# Patient Record
Sex: Female | Born: 1980 | Race: Black or African American | Hispanic: No | Marital: Single | Smoking: Current every day smoker
Health system: Southern US, Community
[De-identification: ages and names within clinical notes are randomized; demographics above are authoritative.]

## PROBLEM LIST (undated history)

## (undated) DIAGNOSIS — N183 Chronic kidney disease, stage 3 unspecified: Secondary | ICD-10-CM

## (undated) DIAGNOSIS — N946 Dysmenorrhea, unspecified: Secondary | ICD-10-CM

## (undated) DIAGNOSIS — M5030 Other cervical disc degeneration, unspecified cervical region: Secondary | ICD-10-CM

## (undated) DIAGNOSIS — Z973 Presence of spectacles and contact lenses: Secondary | ICD-10-CM

## (undated) DIAGNOSIS — G43909 Migraine, unspecified, not intractable, without status migrainosus: Secondary | ICD-10-CM

## (undated) DIAGNOSIS — E559 Vitamin D deficiency, unspecified: Secondary | ICD-10-CM

## (undated) DIAGNOSIS — Z8759 Personal history of other complications of pregnancy, childbirth and the puerperium: Secondary | ICD-10-CM

## (undated) DIAGNOSIS — G8929 Other chronic pain: Secondary | ICD-10-CM

## (undated) DIAGNOSIS — A599 Trichomoniasis, unspecified: Secondary | ICD-10-CM

## (undated) DIAGNOSIS — Z8619 Personal history of other infectious and parasitic diseases: Secondary | ICD-10-CM

## (undated) DIAGNOSIS — I1A0 Resistant hypertension: Secondary | ICD-10-CM

## (undated) DIAGNOSIS — Z8679 Personal history of other diseases of the circulatory system: Secondary | ICD-10-CM

## (undated) DIAGNOSIS — R001 Bradycardia, unspecified: Secondary | ICD-10-CM

## (undated) DIAGNOSIS — F329 Major depressive disorder, single episode, unspecified: Secondary | ICD-10-CM

## (undated) DIAGNOSIS — D259 Leiomyoma of uterus, unspecified: Secondary | ICD-10-CM

## (undated) DIAGNOSIS — N939 Abnormal uterine and vaginal bleeding, unspecified: Secondary | ICD-10-CM

## (undated) DIAGNOSIS — F1011 Alcohol abuse, in remission: Secondary | ICD-10-CM

## (undated) DIAGNOSIS — D500 Iron deficiency anemia secondary to blood loss (chronic): Secondary | ICD-10-CM

## (undated) DIAGNOSIS — F17200 Nicotine dependence, unspecified, uncomplicated: Secondary | ICD-10-CM

## (undated) DIAGNOSIS — R0609 Other forms of dyspnea: Secondary | ICD-10-CM

## (undated) DIAGNOSIS — M109 Gout, unspecified: Secondary | ICD-10-CM

## (undated) DIAGNOSIS — IMO0001 Reserved for inherently not codable concepts without codable children: Secondary | ICD-10-CM

## (undated) DIAGNOSIS — R87629 Unspecified abnormal cytological findings in specimens from vagina: Secondary | ICD-10-CM

## (undated) DIAGNOSIS — D219 Benign neoplasm of connective and other soft tissue, unspecified: Secondary | ICD-10-CM

## (undated) DIAGNOSIS — I10 Essential (primary) hypertension: Secondary | ICD-10-CM

## (undated) DIAGNOSIS — O1490 Unspecified pre-eclampsia, unspecified trimester: Secondary | ICD-10-CM

## (undated) DIAGNOSIS — N83209 Unspecified ovarian cyst, unspecified side: Secondary | ICD-10-CM

## (undated) DIAGNOSIS — R896 Abnormal cytological findings in specimens from other organs, systems and tissues: Secondary | ICD-10-CM

## (undated) HISTORY — DX: Personal history of other diseases of the circulatory system: Z86.79

## (undated) HISTORY — DX: Gout, unspecified: M10.9

## (undated) HISTORY — DX: Migraine, unspecified, not intractable, without status migrainosus: G43.909

## (undated) HISTORY — DX: Alcohol abuse, in remission: F10.11

## (undated) HISTORY — DX: Bradycardia, unspecified: R00.1

## (undated) HISTORY — DX: Reserved for inherently not codable concepts without codable children: IMO0001

## (undated) HISTORY — DX: Unspecified pre-eclampsia, unspecified trimester: O14.90

## (undated) HISTORY — DX: Trichomoniasis, unspecified: A59.9

## (undated) HISTORY — DX: Unspecified abnormal cytological findings in specimens from vagina: R87.629

## (undated) HISTORY — DX: Nicotine dependence, unspecified, uncomplicated: F17.200

## (undated) HISTORY — DX: Unspecified ovarian cyst, unspecified side: N83.209

## (undated) HISTORY — DX: Abnormal cytological findings in specimens from other organs, systems and tissues: R89.6

## (undated) HISTORY — DX: Essential (primary) hypertension: I10

---

## 1999-05-14 ENCOUNTER — Emergency Department (HOSPITAL_COMMUNITY): Admission: EM | Admit: 1999-05-14 | Discharge: 1999-05-14 | Payer: Self-pay | Admitting: Emergency Medicine

## 2001-09-20 ENCOUNTER — Other Ambulatory Visit: Admission: RE | Admit: 2001-09-20 | Discharge: 2001-09-20 | Payer: Self-pay | Admitting: Obstetrics and Gynecology

## 2001-10-22 ENCOUNTER — Inpatient Hospital Stay (HOSPITAL_COMMUNITY): Admission: AD | Admit: 2001-10-22 | Discharge: 2001-10-22 | Payer: Self-pay | Admitting: Obstetrics and Gynecology

## 2001-11-27 ENCOUNTER — Inpatient Hospital Stay (HOSPITAL_COMMUNITY): Admission: AD | Admit: 2001-11-27 | Discharge: 2001-11-27 | Payer: Self-pay | Admitting: Obstetrics and Gynecology

## 2002-02-02 ENCOUNTER — Inpatient Hospital Stay (HOSPITAL_COMMUNITY): Admission: AD | Admit: 2002-02-02 | Discharge: 2002-02-04 | Payer: Self-pay | Admitting: Obstetrics

## 2002-02-02 ENCOUNTER — Encounter: Payer: Self-pay | Admitting: Obstetrics

## 2002-02-20 ENCOUNTER — Inpatient Hospital Stay (HOSPITAL_COMMUNITY): Admission: AD | Admit: 2002-02-20 | Discharge: 2002-02-25 | Payer: Self-pay | Admitting: Obstetrics

## 2002-02-20 ENCOUNTER — Encounter (INDEPENDENT_AMBULATORY_CARE_PROVIDER_SITE_OTHER): Payer: Self-pay | Admitting: Specialist

## 2003-06-07 ENCOUNTER — Emergency Department (HOSPITAL_COMMUNITY): Admission: EM | Admit: 2003-06-07 | Discharge: 2003-06-07 | Payer: Self-pay | Admitting: Emergency Medicine

## 2003-07-06 ENCOUNTER — Encounter: Admission: RE | Admit: 2003-07-06 | Discharge: 2003-07-06 | Payer: Self-pay | Admitting: Internal Medicine

## 2003-07-20 ENCOUNTER — Encounter: Admission: RE | Admit: 2003-07-20 | Discharge: 2003-07-20 | Payer: Self-pay | Admitting: Internal Medicine

## 2003-08-01 ENCOUNTER — Encounter: Admission: RE | Admit: 2003-08-01 | Discharge: 2003-08-01 | Payer: Self-pay | Admitting: Internal Medicine

## 2003-09-04 ENCOUNTER — Encounter: Admission: RE | Admit: 2003-09-04 | Discharge: 2003-09-04 | Payer: Self-pay | Admitting: Internal Medicine

## 2003-09-05 ENCOUNTER — Ambulatory Visit (HOSPITAL_COMMUNITY): Admission: RE | Admit: 2003-09-05 | Discharge: 2003-09-05 | Payer: Self-pay | Admitting: Internal Medicine

## 2003-10-05 ENCOUNTER — Encounter: Admission: RE | Admit: 2003-10-05 | Discharge: 2003-10-05 | Payer: Self-pay | Admitting: Internal Medicine

## 2003-12-14 ENCOUNTER — Emergency Department (HOSPITAL_COMMUNITY): Admission: EM | Admit: 2003-12-14 | Discharge: 2003-12-14 | Payer: Self-pay | Admitting: Emergency Medicine

## 2004-03-27 ENCOUNTER — Ambulatory Visit: Payer: Self-pay | Admitting: Internal Medicine

## 2004-06-11 ENCOUNTER — Ambulatory Visit: Payer: Self-pay | Admitting: Internal Medicine

## 2004-08-07 ENCOUNTER — Ambulatory Visit: Payer: Self-pay | Admitting: Internal Medicine

## 2004-08-07 ENCOUNTER — Inpatient Hospital Stay (HOSPITAL_COMMUNITY): Admission: AD | Admit: 2004-08-07 | Discharge: 2004-08-08 | Payer: Self-pay | Admitting: Internal Medicine

## 2004-08-08 ENCOUNTER — Encounter (INDEPENDENT_AMBULATORY_CARE_PROVIDER_SITE_OTHER): Payer: Self-pay | Admitting: *Deleted

## 2005-03-20 ENCOUNTER — Ambulatory Visit: Payer: Self-pay | Admitting: Internal Medicine

## 2005-03-25 ENCOUNTER — Ambulatory Visit (HOSPITAL_COMMUNITY)
Admission: RE | Admit: 2005-03-25 | Discharge: 2005-03-25 | Payer: Self-pay | Admitting: Physical Medicine & Rehabilitation

## 2005-05-28 ENCOUNTER — Ambulatory Visit (HOSPITAL_COMMUNITY): Admission: RE | Admit: 2005-05-28 | Discharge: 2005-05-28 | Payer: Self-pay | Admitting: Internal Medicine

## 2005-07-21 ENCOUNTER — Ambulatory Visit: Payer: Self-pay | Admitting: Internal Medicine

## 2005-07-23 ENCOUNTER — Ambulatory Visit (HOSPITAL_COMMUNITY): Admission: RE | Admit: 2005-07-23 | Discharge: 2005-07-23 | Payer: Self-pay | Admitting: Internal Medicine

## 2006-01-29 ENCOUNTER — Ambulatory Visit: Payer: Self-pay | Admitting: Internal Medicine

## 2006-02-04 DIAGNOSIS — I10 Essential (primary) hypertension: Secondary | ICD-10-CM | POA: Insufficient documentation

## 2006-02-04 DIAGNOSIS — N83209 Unspecified ovarian cyst, unspecified side: Secondary | ICD-10-CM

## 2006-02-04 DIAGNOSIS — F17200 Nicotine dependence, unspecified, uncomplicated: Secondary | ICD-10-CM | POA: Insufficient documentation

## 2006-02-04 HISTORY — DX: Unspecified ovarian cyst, unspecified side: N83.209

## 2006-02-06 ENCOUNTER — Emergency Department (HOSPITAL_COMMUNITY): Admission: EM | Admit: 2006-02-06 | Discharge: 2006-02-06 | Payer: Self-pay | Admitting: Emergency Medicine

## 2006-02-13 ENCOUNTER — Ambulatory Visit: Payer: Self-pay | Admitting: Internal Medicine

## 2006-02-13 ENCOUNTER — Encounter (INDEPENDENT_AMBULATORY_CARE_PROVIDER_SITE_OTHER): Payer: Self-pay | Admitting: Pulmonary Disease

## 2006-02-13 LAB — CONVERTED CEMR LAB
BUN: 15 mg/dL (ref 6–23)
CO2: 30 meq/L (ref 19–32)
Calcium: 10.1 mg/dL (ref 8.4–10.5)
Chloride: 105 meq/L (ref 96–112)
Creatinine, Ser: 1.1 mg/dL (ref 0.40–1.20)
Glucose, Bld: 92 mg/dL (ref 70–99)
Potassium: 3.8 meq/L (ref 3.5–5.3)
Sodium: 143 meq/L (ref 135–145)

## 2006-04-09 ENCOUNTER — Ambulatory Visit: Payer: Self-pay | Admitting: Internal Medicine

## 2006-04-09 ENCOUNTER — Encounter (INDEPENDENT_AMBULATORY_CARE_PROVIDER_SITE_OTHER): Payer: Self-pay | Admitting: *Deleted

## 2006-04-09 LAB — CONVERTED CEMR LAB
Bilirubin Urine: NEGATIVE
Ketones, ur: NEGATIVE mg/dL
Leukocytes, UA: NEGATIVE
Nitrite: NEGATIVE
Protein, ur: NEGATIVE mg/dL
RBC / HPF: NONE SEEN (ref <3)
Specific Gravity, Urine: 1.018 (ref 1.005–1.03)
Urine Glucose: NEGATIVE mg/dL
Urobilinogen, UA: 0.2 (ref 0.0–1.0)
WBC, UA: NONE SEEN cells/hpf (ref <3)
pH: 6.5 (ref 5.0–8.0)

## 2006-07-07 ENCOUNTER — Telehealth: Payer: Self-pay | Admitting: *Deleted

## 2006-10-22 ENCOUNTER — Telehealth (INDEPENDENT_AMBULATORY_CARE_PROVIDER_SITE_OTHER): Payer: Self-pay | Admitting: *Deleted

## 2006-12-16 ENCOUNTER — Telehealth: Payer: Self-pay | Admitting: *Deleted

## 2007-04-05 ENCOUNTER — Ambulatory Visit: Payer: Self-pay | Admitting: Internal Medicine

## 2007-04-05 ENCOUNTER — Encounter (INDEPENDENT_AMBULATORY_CARE_PROVIDER_SITE_OTHER): Payer: Self-pay | Admitting: Infectious Diseases

## 2007-04-05 ENCOUNTER — Telehealth (INDEPENDENT_AMBULATORY_CARE_PROVIDER_SITE_OTHER): Payer: Self-pay | Admitting: Internal Medicine

## 2007-04-05 LAB — CONVERTED CEMR LAB
BUN: 22 mg/dL (ref 6–23)
CO2: 24 meq/L (ref 19–32)
Calcium: 9.1 mg/dL (ref 8.4–10.5)
Chloride: 105 meq/L (ref 96–112)
Creatinine, Ser: 1.08 mg/dL (ref 0.40–1.20)
Glucose, Bld: 86 mg/dL (ref 70–99)
Potassium: 4.1 meq/L (ref 3.5–5.3)
Sodium: 141 meq/L (ref 135–145)

## 2007-04-12 ENCOUNTER — Ambulatory Visit: Payer: Self-pay | Admitting: Hospitalist

## 2007-04-12 DIAGNOSIS — Z8679 Personal history of other diseases of the circulatory system: Secondary | ICD-10-CM

## 2007-04-12 HISTORY — DX: Personal history of other diseases of the circulatory system: Z86.79

## 2007-05-20 ENCOUNTER — Encounter (INDEPENDENT_AMBULATORY_CARE_PROVIDER_SITE_OTHER): Payer: Self-pay | Admitting: Infectious Diseases

## 2007-05-20 ENCOUNTER — Ambulatory Visit: Payer: Self-pay | Admitting: Infectious Disease

## 2007-05-20 LAB — CONVERTED CEMR LAB
BUN: 14 mg/dL (ref 6–23)
Beta hcg, urine, semiquantitative: NEGATIVE
Bilirubin Urine: NEGATIVE
CO2: 25 meq/L (ref 19–32)
Calcium: 9.4 mg/dL (ref 8.4–10.5)
Chloride: 104 meq/L (ref 96–112)
Creatinine, Ser: 1.13 mg/dL (ref 0.40–1.20)
Glucose, Bld: 90 mg/dL (ref 70–99)
Glucose, Urine, Semiquant: NEGATIVE
Ketones, urine, test strip: NEGATIVE
Nitrite: NEGATIVE
Potassium: 4.4 meq/L (ref 3.5–5.3)
Sodium: 139 meq/L (ref 135–145)
Specific Gravity, Urine: 1.02
Urobilinogen, UA: 0.2
hCG, Beta Chain, Quant, S: <2 milliintl units/mL
pH: 6.5

## 2007-05-21 ENCOUNTER — Telehealth (INDEPENDENT_AMBULATORY_CARE_PROVIDER_SITE_OTHER): Payer: Self-pay | Admitting: Infectious Diseases

## 2007-05-21 LAB — CONVERTED CEMR LAB
Bilirubin Urine: NEGATIVE
Ketones, ur: NEGATIVE mg/dL
Nitrite: NEGATIVE
Protein, ur: NEGATIVE mg/dL
Specific Gravity, Urine: 1.022 (ref 1.005–1.03)
Urine Glucose: NEGATIVE mg/dL
Urobilinogen, UA: 0.2 (ref 0.0–1.0)
pH: 6.5 (ref 5.0–8.0)

## 2008-01-17 ENCOUNTER — Telehealth (INDEPENDENT_AMBULATORY_CARE_PROVIDER_SITE_OTHER): Payer: Self-pay | Admitting: Internal Medicine

## 2008-06-14 ENCOUNTER — Telehealth: Payer: Self-pay | Admitting: *Deleted

## 2008-07-15 ENCOUNTER — Emergency Department (HOSPITAL_COMMUNITY): Admission: EM | Admit: 2008-07-15 | Discharge: 2008-07-15 | Payer: Self-pay | Admitting: Family Medicine

## 2008-09-20 ENCOUNTER — Telehealth (INDEPENDENT_AMBULATORY_CARE_PROVIDER_SITE_OTHER): Payer: Self-pay | Admitting: Internal Medicine

## 2008-10-11 ENCOUNTER — Emergency Department (HOSPITAL_COMMUNITY): Admission: EM | Admit: 2008-10-11 | Discharge: 2008-10-11 | Payer: Self-pay | Admitting: Emergency Medicine

## 2009-02-14 ENCOUNTER — Ambulatory Visit: Payer: Self-pay | Admitting: Internal Medicine

## 2009-02-14 ENCOUNTER — Telehealth: Payer: Self-pay | Admitting: *Deleted

## 2009-02-15 ENCOUNTER — Emergency Department (HOSPITAL_COMMUNITY): Admission: EM | Admit: 2009-02-15 | Discharge: 2009-02-15 | Payer: Self-pay | Admitting: Emergency Medicine

## 2009-02-15 ENCOUNTER — Encounter (INDEPENDENT_AMBULATORY_CARE_PROVIDER_SITE_OTHER): Payer: Self-pay | Admitting: Internal Medicine

## 2009-02-15 LAB — CONVERTED CEMR LAB
ALT: 14 units/L (ref 0–35)
AST: 16 units/L (ref 0–37)
Albumin: 4 g/dL (ref 3.5–5.2)
Alkaline Phosphatase: 68 units/L (ref 39–117)
BUN: 18 mg/dL (ref 6–23)
Bilirubin Urine: NEGATIVE
CO2: 25 meq/L (ref 19–32)
Calcium: 9.3 mg/dL (ref 8.4–10.5)
Chloride: 107 meq/L (ref 96–112)
Creatinine, Ser: 1.1 mg/dL (ref 0.40–1.20)
Glucose, Bld: 83 mg/dL (ref 70–99)
HCT: 37.6 % (ref 36.0–46.0)
Hemoglobin: 11.9 g/dL — ABNORMAL LOW (ref 12.0–15.0)
Ketones, ur: NEGATIVE mg/dL
Leukocytes, UA: NEGATIVE
MCHC: 31.6 g/dL (ref 30.0–36.0)
MCV: 91.9 fL (ref >=78.0)
Nitrite: NEGATIVE
Platelets: 229 10*3/uL (ref 150–400)
Potassium: 4.1 meq/L (ref 3.5–5.3)
Protein, ur: NEGATIVE mg/dL
RBC / HPF: NONE SEEN (ref <3)
RBC: 4.09 M/uL (ref 3.87–5.11)
RDW: 13.4 % (ref 11.5–15.5)
Sodium: 141 meq/L (ref 135–145)
Specific Gravity, Urine: 1.024 (ref 1.005–1.0)
TSH: 0.826 microintl units/mL (ref 0.350–4.5)
Total Bilirubin: 0.4 mg/dL (ref 0.3–1.2)
Total Protein: 6.9 g/dL (ref 6.0–8.3)
Urine Glucose: NEGATIVE mg/dL
Urobilinogen, UA: 0.2 (ref 0.0–1.0)
WBC: 8.6 10*3/uL (ref 4.0–10.5)
pH: 6 (ref 5.0–8.0)

## 2009-04-26 ENCOUNTER — Ambulatory Visit: Payer: Self-pay | Admitting: Internal Medicine

## 2009-04-27 LAB — CONVERTED CEMR LAB
Candida species: NEGATIVE
Gardnerella vaginalis: NEGATIVE

## 2009-04-30 ENCOUNTER — Telehealth: Payer: Self-pay | Admitting: Internal Medicine

## 2009-06-21 ENCOUNTER — Ambulatory Visit: Payer: Self-pay | Admitting: Internal Medicine

## 2009-06-21 DIAGNOSIS — N920 Excessive and frequent menstruation with regular cycle: Secondary | ICD-10-CM | POA: Insufficient documentation

## 2009-06-21 LAB — CONVERTED CEMR LAB
ALT: 22 units/L (ref 0–35)
AST: 22 units/L (ref 0–37)
Albumin: 3.7 g/dL (ref 3.5–5.2)
Alkaline Phosphatase: 69 units/L (ref 39–117)
BUN: 17 mg/dL (ref 6–23)
Basophils Absolute: 0 10*3/uL (ref 0.0–0.1)
Basophils Relative: 0 % (ref 0–1)
CO2: 29 meq/L (ref 19–32)
Calcium: 8.9 mg/dL (ref 8.4–10.5)
Chloride: 103 meq/L (ref 96–112)
Creatinine, Ser: 0.95 mg/dL (ref 0.40–1.20)
Eosinophils Absolute: 0.2 10*3/uL (ref 0.0–0.7)
Eosinophils Relative: 2 % (ref 0–5)
Glucose, Bld: 99 mg/dL (ref 70–99)
HCT: 34.4 % — ABNORMAL LOW (ref 36.0–46.0)
Hemoglobin: 12 g/dL (ref 12.0–15.0)
INR: 0.99 (ref <1.50)
Lipase: 20 units/L (ref 11–59)
Lymphocytes Relative: 22 % (ref 12–46)
Lymphs Abs: 1.9 10*3/uL (ref 0.7–4.0)
MCHC: 34.8 g/dL (ref 30.0–36.0)
MCV: 90.5 fL (ref >=78.0)
Monocytes Absolute: 0.4 10*3/uL (ref 0.1–1.0)
Monocytes Relative: 5 % (ref 3–12)
Neutro Abs: 6.3 10*3/uL (ref 1.7–7.7)
Neutrophils Relative %: 71 % (ref 43–77)
Platelets: 215 10*3/uL (ref 150–400)
Potassium: 3.8 meq/L (ref 3.5–5.3)
Prothrombin Time: 13 s (ref 11.6–15.2)
RBC: 3.8 M/uL — ABNORMAL LOW (ref 3.87–5.11)
RDW: 13.3 % (ref 11.5–15.5)
Sodium: 138 meq/L (ref 135–145)
Total Bilirubin: 0.6 mg/dL (ref 0.3–1.2)
Total Protein: 6.8 g/dL (ref 6.0–8.3)
WBC: 8.8 10*3/uL (ref 4.0–10.5)
aPTT: 25 s (ref 24–37)

## 2009-08-14 ENCOUNTER — Telehealth: Payer: Self-pay | Admitting: Internal Medicine

## 2009-08-22 ENCOUNTER — Telehealth: Payer: Self-pay | Admitting: Internal Medicine

## 2009-08-29 ENCOUNTER — Ambulatory Visit: Payer: Self-pay | Admitting: Internal Medicine

## 2009-08-29 ENCOUNTER — Encounter: Payer: Self-pay | Admitting: Internal Medicine

## 2009-11-22 ENCOUNTER — Emergency Department (HOSPITAL_COMMUNITY): Admission: EM | Admit: 2009-11-22 | Discharge: 2009-11-23 | Payer: Self-pay | Admitting: Emergency Medicine

## 2010-04-01 ENCOUNTER — Ambulatory Visit: Payer: Self-pay | Admitting: Internal Medicine

## 2010-04-02 ENCOUNTER — Ambulatory Visit (HOSPITAL_COMMUNITY)
Admission: RE | Admit: 2010-04-02 | Discharge: 2010-04-02 | Payer: Self-pay | Source: Home / Self Care | Attending: Internal Medicine | Admitting: Internal Medicine

## 2010-04-02 ENCOUNTER — Encounter: Payer: Self-pay | Admitting: Internal Medicine

## 2010-04-02 DIAGNOSIS — F329 Major depressive disorder, single episode, unspecified: Secondary | ICD-10-CM | POA: Insufficient documentation

## 2010-04-02 LAB — CONVERTED CEMR LAB
Amphetamine Screen, Ur: NEGATIVE
Barbiturate Quant, Ur: NEGATIVE
Benzodiazepines.: NEGATIVE
Cholesterol: 169 mg/dL (ref 0–200)
Cocaine Metabolites: NEGATIVE
Creatinine,U: 355.5 mg/dL
HDL: 51 mg/dL (ref >39)
LDL Cholesterol: 100 mg/dL — ABNORMAL HIGH (ref 0–99)
Marijuana Metabolite: NEGATIVE
Methadone: NEGATIVE
Opiates: NEGATIVE
Phencyclidine (PCP): NEGATIVE
Propoxyphene: NEGATIVE
TSH: 1.16 microintl units/mL (ref 0.350–4.50)
Total CHOL/HDL Ratio: 3.3
Total CK: 247 units/L — ABNORMAL HIGH (ref 7–177)
Triglycerides: 89 mg/dL (ref <150)
VLDL: 18 mg/dL (ref 0–40)

## 2010-04-04 ENCOUNTER — Telehealth: Payer: Self-pay | Admitting: Licensed Clinical Social Worker

## 2010-04-08 ENCOUNTER — Encounter: Payer: Self-pay | Admitting: Licensed Clinical Social Worker

## 2010-04-17 ENCOUNTER — Ambulatory Visit: Payer: Self-pay

## 2010-05-01 ENCOUNTER — Ambulatory Visit: Admit: 2010-05-01 | Payer: Self-pay

## 2010-05-21 NOTE — Assessment & Plan Note (Signed)
Summary: 1WK FU/COHEN/VS   Vital Signs:  Patient Profile:   30 Years Old Female Temp:     98.0 degrees F (36.67 degrees C) Pulse rate:   50 / minute BP sitting:   147 / 104  (left arm)  Vitals Entered By: Stanton Kidney Ditzler RN (April 12, 2007 11:57 AM)             Is Patient Diabetic? No  Does patient need assistance? Functional Status Self care Ambulation Normal     PCP:  Valetta Close MD  Chief Complaint:  FU.  History of Present Illness: Jeanette Gamble is here for a follow up on her BP. She has been taking her antihypertensive medications regularly. She also complains of occasional blurry vision. She also mentions that she does not sleep well at night and wakes up tired in the morning   Current Allergies (reviewed today): No known allergies  Updated/Current Medications (including changes made in today's visit):  HYDROCHLOROTHIAZIDE 25 MG TABS (HYDROCHLOROTHIAZIDE) Take 1 tablet by mouth once a day LISINOPRIL 40 MG  TABS (LISINOPRIL) Take 1 tablet by mouth once a day IMITREX 50 MG TABS (SUMATRIPTAN SUCCINATE) by mouth daily as needed for migraines   Past Medical History:    Reviewed history from 02/04/2006 and no changes required:       Hypertension       Ovarian cysts-benign by [place]       Preeclampsia       Smoking       Migraine headache       Trichomonas       Bradicardia                  Risk Factors: Tobacco use:  current   Review of Systems  The patient denies anorexia, fever, weight loss, vision loss, decreased hearing, hoarseness, chest pain, syncope, dyspnea on exhertion, peripheral edema, prolonged cough, hemoptysis, abdominal pain, melena, hematochezia, severe indigestion/heartburn, and hematuria.     Physical Exam  General:     overweight-appearing.   Head:     normocephalic.   Eyes:     vision grossly intact.   Neck:     supple.   Lungs:     normal respiratory effort, no intercostal retractions, no accessory muscle use, and normal breath  sounds.   Heart:     normal rate, regular rhythm, and no murmur.   Abdomen:     soft, non-tender, normal bowel sounds, no distention, and no masses.   Msk:     normal ROM and no joint tenderness.   Neurologic:     alert & oriented X3, cranial nerves II-XII intact, strength normal in all extremities, sensation intact to light touch, sensation intact to pinprick, and gait normal.      Impression & Recommendations:  Problem # 1:  HYPERTENSION (ICD-401.9) Her BP is improved from the last visit but still elevated.She has been taking her medications regularly.Will increase her Lisinopril to 40 mg daily. Her BMET appears normal and will need to repeat in the next visit. She will continue HCTZ. If no response will need to add norvasc. Her low heart rate precludes using b blockers. She has had some work up for secondary cause of hypertension. Her MRI of the abdomen did not show renal artery stenosis or tumors of the adrenals. She has a BMET done recently which does not show hypokalemia suggestive of hyperaldosteronism. There is a concern for sleep apnea and a sleep study is beeing done. No history  of steroid or OCP use. May consider metanephrine and TSH check if control is still an issue. The following medications were removed from the medication list:    Norvasc 10 Mg Tabs (Amlodipine besylate) .Marland Kitchen... Take 1 tablet by mouth once a day  Her updated medication list for this problem includes:    Hydrochlorothiazide 25 Mg Tabs (Hydrochlorothiazide) .Marland Kitchen... Take 1 tablet by mouth once a day    Lisinopril 40 Mg Tabs (Lisinopril) .Marland Kitchen... Take 1 tablet by mouth once a day  BP today: 147/104 Prior BP: 166/111 (04/05/2007)  Labs Reviewed: Creat: 1.08 (04/05/2007)   Problem # 2:  BLURRED VISION (ICD-368.8) She is being referred to ophthalmology. She does have a history of poorly controlled hypertension. Orders: Ophthalmology Referral (Ophthalmology)   Problem # 3:  RISK OF SLEEP APNEA (ICD-V12.59) She  complains of insomnia, feeling tired in the morning and headaches.She does have the body habitus for OSA. This could be exacerbating the HTN  Orders: Sleep Study Other (Sleep Study Other)   Complete Medication List: 1)  Hydrochlorothiazide 25 Mg Tabs (Hydrochlorothiazide) .... Take 1 tablet by mouth once a day 2)  Lisinopril 40 Mg Tabs (Lisinopril) .... Take 1 tablet by mouth once a day 3)  Imitrex 50 Mg Tabs (Sumatriptan succinate) .... By mouth daily as needed for migraines   Patient Instructions: 1)  Please schedule a follow-up appointment in 3 weeks.    Prescriptions: LISINOPRIL 40 MG  TABS (LISINOPRIL) Take 1 tablet by mouth once a day  #31 x 6   Entered and Authorized by:   Ronda Fairly MD   Signed by:   Ronda Fairly MD on 04/12/2007   Method used:   Electronically sent to ...       [id number]       [address]       [place], [place]  [address]       Ph: [phone] or [phone]       Fax: [phone]   RxID:   [id number]  ]  Appended Document: 1WK FU/COHEN/VS Add established office visit level 3. Attending is Dr Okey Dupre

## 2010-05-21 NOTE — Progress Notes (Signed)
Summary: refill/ hla  Phone Note Refill Request Message from:  Fax from Pharmacy on July 07, 2006 4:22 PM  Refills Requested: Medication #1:  HYDROCHLOROTHIAZIDE 25 MG TABS Take 1 tablet by mouth once a day Initial call taken by: Marin Roberts RN,  July 07, 2006 4:22 PM  Follow-up for Phone Call        Refill approved-nurse to complete. Follow-up by: Eliseo Gum MD,  July 07, 2006 4:55 PM  Additional Follow-up for Phone Call Additional follow up Details #1::        Rx faxed to pharmacy Additional Follow-up by: Marin Roberts RN,  July 08, 2006 9:03 AM    Prescriptions: HYDROCHLOROTHIAZIDE 25 MG TABS (HYDROCHLOROTHIAZIDE) Take 1 tablet by mouth once a day  #30 x 5   Entered and Authorized by:   Eliseo Gum MD   Signed by:   Eliseo Gum MD on 07/07/2006   Method used:   Telephoned to ...       [facility]       [address] [place], [place]  [address]  [place]       Ph: [phone]       Fax: [phone]   RxID:   [id number]

## 2010-05-21 NOTE — Miscellaneous (Signed)
  Clinical Lists Changes  Orders: Added new Test order of Split Night (Split Night) - Signed      Problem # 13:  RISK OF SLEEP APNEA (ICD-V12.59) see office visit  Orders: Split Night (Split Night)   Complete Medication List: 1)  Hydrochlorothiazide 25 Mg Tabs (Hydrochlorothiazide) .... Take 1 tablet by mouth once a day 2)  Norvasc 10 Mg Tabs (Amlodipine besylate) .... Take one tablet daily. 3)  Omeprazole 20 Mg Cpdr (Omeprazole) .... Take 1 tablet by mouth once a day 4)  Coreg 6.25 Mg Tabs (Carvedilol) .... Take 1 tablet by mouth two times a day 5)  Imitrex 50 Mg Tabs (Sumatriptan succinate) .... Take 1 tablet at the onset of headache.

## 2010-05-21 NOTE — Assessment & Plan Note (Signed)
Summary: per dr Onalee Hua f/u bp/pcp-Chanette Demo/hla   Vital Signs:  Patient profile:   30 year old female Height:      66.4 inches (168.66 cm) Weight:      272.8 pounds (124 kg) BMI:     43.66 Temp:     98.4 degrees F oral Pulse rate:   75 / minute BP sitting:   143 / 97  (right arm)  Vitals Entered By: Chinita Pester RN (Aug 29, 2009 10:22 AM) CC: BP re-check.  She's taking 2 tsp. of vinegar daily for BP. Abdominal pain., Headache Is Patient Diabetic? No Pain Assessment Patient in pain? no      Nutritional Status BMI of > 30 = obese  Have you ever been in a relationship where you felt threatened, hurt or afraid?No   Does patient need assistance? Functional Status Self care Ambulation Normal Comments Pt.has talked to [facility].   Primary Care Provider:  Valetta Close MD  CC:  BP re-check.  She's taking 2 tsp. of vinegar daily for BP. Abdominal pain. and Headache.  History of Present Illness: Jeanette Gamble is a 30 yo woman wtih PMH as outlined in chart.  She is here for follow up of blood pressure and nausea.  Feels better but still has nausea all the time.  Continues to eat but states her appetite is not as well as it was previously.  Otherwise no other complaints.   Depression History:      The patient denies a depressed mood most of the day and a diminished interest in her usual daily activities.         Headache Treatment History:      She has tried sumatriptan (Imitrex) which was effective.     Preventive Screening-Counseling & Management  Alcohol-Tobacco     Alcohol drinks/day: 0     Smoking Status: quit     Smoking Cessation Counseling: yes     Packs/Day: 1-2 ppd     Year Quit: Jan. 2011  Caffeine-Diet-Exercise     Does Patient Exercise: yes     Type of exercise: walks on hefr jobs x2  Current Medications (verified): 1)  Hydrochlorothiazide 25 Mg Tabs (Hydrochlorothiazide) .... Take 1 Tablet By Mouth Once A Day 2)  Norvasc 10 Mg Tabs (Amlodipine Besylate)  .... Take One Tablet Daily. 3)  Omeprazole 20 Mg Cpdr (Omeprazole) .... Take 1 Tablet By Mouth Once A Day  Allergies (verified): No Known Drug Allergies  Past History:  Past Medical History: Last updated: 06/21/2009 Hypertension Ovarian cysts-benign by [place] Preeclampsia Smoking alcohol:  quit 03/2008 (drank 1-2 bottles liquor daily) Migraine headache Trichomonas Bradicardia  Past Surgical History: Last updated: 06/21/2009 c section 2003  Social History: Last updated: 04/26/2009 has a 59 year old son (2011) just quit smoking  Risk Factors: Smoking Status: quit (08/29/2009) Packs/Day: 1-2 ppd (08/29/2009)  Social History: Does Patient Exercise:  yes  Review of Systems      See HPI  Physical Exam  General:  obese, alert and cooperative to examination.   Lungs:  normal respiratory effort and no accessory muscle use.   Neurologic:  alert & oriented X3, cranial nerves II-XII intact, and strength normal in all extremities.   Psych:  Oriented X3, memory intact for recent and remote, and normally interactive.     Impression & Recommendations:  Problem # 1:  HYPERTENSION (ICD-401.9) will add coreg as below  Her updated medication list for this problem includes:    Hydrochlorothiazide 25 Mg Tabs (  Hydrochlorothiazide) .Marland Kitchen... Take 1 tablet by mouth once a day    Norvasc 10 Mg Tabs (Amlodipine besylate) .Marland Kitchen... Take one tablet daily.    Coreg 6.25 Mg Tabs (Carvedilol) .Marland Kitchen... Take 1 tablet by mouth two times a day  BP today: 143/97 Prior BP: 151/100 (06/21/2009)  Labs Reviewed: K+: 3.8 (06/21/2009) Creat: : 0.95 (06/21/2009)     Problem # 2:  NAUSEA (ICD-787.02) work up last visit unrevealing didn't realize that she should use the PPI given empirically last time will start now and refer to GI for further eval  Orders: Gastroenterology Referral (GI)  Problem # 3:  COMMON MIGRAINE (ICD-346.10) Starting imitrex given prior use and efficacy Advised about  contraindication during pregnancy coreg may help prevent headaches  Her updated medication list for this problem includes:    Coreg 6.25 Mg Tabs (Carvedilol) .Marland Kitchen... Take 1 tablet by mouth two times a day    Imitrex 50 Mg Tabs (Sumatriptan succinate) .Marland Kitchen... Take 1 tablet at the onset of headache.  Problem # 4:  RISK OF SLEEP APNEA (ICD-V12.59) will reschedule study will help with somnolence, migranes and BP if OSA present and treated.  Complete Medication List: 1)  Hydrochlorothiazide 25 Mg Tabs (Hydrochlorothiazide) .... Take 1 tablet by mouth once a day 2)  Norvasc 10 Mg Tabs (Amlodipine besylate) .... Take one tablet daily. 3)  Omeprazole 20 Mg Cpdr (Omeprazole) .... Take 1 tablet by mouth once a day 4)  Coreg 6.25 Mg Tabs (Carvedilol) .... Take 1 tablet by mouth two times a day 5)  Imitrex 50 Mg Tabs (Sumatriptan succinate) .... Take 1 tablet at the onset of headache.  Patient Instructions: 1)  Please schedule a follow-up appointment in 1 month. 2)  Will start coreg as listed below for blood pressure, should also help prevent headaches.  3)  start the omeprazole for your stomach 4)  will give imitrex for headaches as discussed, remember you should not get pregnant on these medications.  5)  If you have any problems, call clinic.  Prescriptions: IMITREX 50 MG TABS (SUMATRIPTAN SUCCINATE) take 1 tablet at the onset of headache.  #30 x 0   Entered and Authorized by:   Mariea Stable MD   Signed by:   Mariea Stable MD on 08/29/2009   Method used:   Electronically to        [facility] [phone]* (retail)       [address]       [place], [place]  [address]       Ph: [phone]       Fax: [phone]   RxID:   [id number] COREG 6.25 MG TABS (CARVEDILOL) Take 1 tablet by mouth two times a day  #60 x 3   Entered and Authorized by:   Mariea Stable MD   Signed by:   Mariea Stable MD on 08/29/2009   Method used:   Electronically to        [facility]  [phone]* (retail)       [address]       [place], [place]  [address]       Ph: [phone]       Fax: [phone]   RxID:   [id number] OMEPRAZOLE 20 MG CPDR (OMEPRAZOLE) Take 1 tablet by mouth once a day  #30 x 1   Entered and Authorized by:   Mariea Stable MD   Signed by:   Mariea Stable MD on 08/29/2009   Method used:   Electronically  to        [facility] [phone]* (retail)       [address]       [place], [place]  [address]       Ph: [phone]       Fax: [phone]   RxID:   [id number]   Prevention & Chronic Care Immunizations   Influenza vaccine: Not documented    Tetanus booster: Not documented    Pneumococcal vaccine: Not documented  Other Screening   Pap smear: NEGATIVE FOR INTRAEPITHELIAL LESIONS OR MALIGNANCY.  (04/26/2009)   Smoking status: quit  (08/29/2009)  Hypertension   Last Blood Pressure: 143 / 97  (08/29/2009)   Serum creatinine: 0.95  (06/21/2009)   Serum potassium 3.8  (06/21/2009)  Self-Management Support :    Patient will work on the following items until the next clinic visit to reach self-care goals:     Medications and monitoring: take my medicines every day, check my blood pressure, bring all of my medications to every visit  (08/29/2009)     Eating: eat more vegetables, use fresh or frozen vegetables, eat foods that are low in salt, eat baked foods instead of fried foods, eat fruit for snacks and desserts  (08/29/2009)     Activity: take a 30 minute walk every day  (06/21/2009)    Hypertension self-management support: Resources for patients handout, Written self-care plan  (08/29/2009)   Hypertension self-care plan printed.      Resource handout printed.

## 2010-05-21 NOTE — Progress Notes (Signed)
Summary: refill/gg  Phone Note Refill Request  on August 14, 2009 4:03 PM  Refills Requested: Medication #1:  HYDROCHLOROTHIAZIDE 25 MG TABS Take 1 tablet by mouth once a day   Last Refilled: 07/06/2009  Medication #2:  NORVASC 10 MG TABS Take one tablet daily.   Last Refilled: 07/06/2009  Method Requested: Electronic Initial call taken by: Merrie Roof RN,  August 14, 2009 4:03 PM  Follow-up for Phone Call        Rx faxed to pharmacy  Pt should be seen to follow up on blood pressure and complaints from prior visit. Follow-up by: Mariea Stable MD,  Aug 20, 2009 6:39 AM    Prescriptions: NORVASC 10 MG TABS (AMLODIPINE BESYLATE) Take one tablet daily.  #32 x 3   Entered and Authorized by:   Mariea Stable MD   Signed by:   Mariea Stable MD on 08/20/2009   Method used:   Electronically to        [facility] [phone]* (retail)       [address]       [place], [place]  [address]       Ph: [phone]       Fax: [phone]   RxID:   [id number] HYDROCHLOROTHIAZIDE 25 MG TABS (HYDROCHLOROTHIAZIDE) Take 1 tablet by mouth once a day  #30 x 3   Entered and Authorized by:   Mariea Stable MD   Signed by:   Mariea Stable MD on 08/20/2009   Method used:   Electronically to        [facility] [phone]* (retail)       [address]       [place], [place]  [address]       Ph: [phone]       Fax: [phone]   RxID:   [id number]   Appended Document: refill/gg flag sent to [facility] to schedule OV

## 2010-05-21 NOTE — Progress Notes (Signed)
Summary: refill/ hla  Phone Note Refill Request Message from:  Fax from Pharmacy on January 17, 2008 12:28 PM  Refills Requested: Medication #1:  HYDROCHLOROTHIAZIDE 25 MG TABS Take 1 tablet by mouth once a day   Last Refilled: 6/30 Initial call taken by: Marin Roberts RN,  January 17, 2008 12:29 PM  Follow-up for Phone Call        Would like to see Ms. Stirewalt in the near future for a routine check up. Follow-up by: Valetta Close MD,  January 17, 2008 1:36 PM      Prescriptions: NORVASC 5 MG  TABS (AMLODIPINE BESYLATE) Take 1 tablet by mouth once a day  #31 x 6   Entered and Authorized by:   Valetta Close MD   Signed by:   Valetta Close MD on 01/17/2008   Method used:   Electronically to        [id number]* (retail)       [address]       [place], [place]  [address]       Ph: [phone] or [phone]       Fax: [phone]   RxID:   [id number] HYDROCHLOROTHIAZIDE 25 MG TABS (HYDROCHLOROTHIAZIDE) Take 1 tablet by mouth once a day  #31 x 5   Entered and Authorized by:   Valetta Close MD   Signed by:   Valetta Close MD on 01/17/2008   Method used:   Electronically to        [id number] (retail)       [address]       [place], [place]  [address]       Ph: [phone] or [phone]       Fax: [phone]   RxID:   [id number]

## 2010-05-21 NOTE — Progress Notes (Signed)
Summary: appt/ meds/ hla  Phone Note Call from Patient   Summary of Call: pt calls stating she needs her meds, sorry she missed her appt but her bp is up, appt is made for 5/11 and she is encouraged to keep appt so she can continue to have meds refilled, has missed last 2 appts, pt states she will come to next appt Initial call taken by: Marin Roberts RN,  Aug 22, 2009 11:54 AM  Follow-up for Phone Call        Rx Called In Follow-up by: Mariea Stable MD,  Aug 22, 2009 12:20 PM    Prescriptions: NORVASC 10 MG TABS (AMLODIPINE BESYLATE) Take one tablet daily.  #32 x 3   Entered and Authorized by:   Mariea Stable MD   Signed by:   Mariea Stable MD on 08/22/2009   Method used:   Electronically to        [facility]  [address] [phone]* (retail)       [address]       [place], [place]  [address]       Ph: [phone]       Fax: [phone]   RxID:   [id number] HYDROCHLOROTHIAZIDE 25 MG TABS (HYDROCHLOROTHIAZIDE) Take 1 tablet by mouth once a day  #30 x 3   Entered and Authorized by:   Mariea Stable MD   Signed by:   Mariea Stable MD on 08/22/2009   Method used:   Electronically to        [facility]  [address] [phone]* (retail)       [address]       [place], [place]  [address]       Ph: [phone]       Fax: [phone]   RxID:   [id number]

## 2010-05-21 NOTE — Assessment & Plan Note (Signed)
Summary: FU/ BP/ STOMACH/ SB.   Vital Signs:  Patient Profile:   30 Years Old Female Height:     66.4 inches (168.66 cm) Weight:      246.06 pounds (111.85 kg) BMI:     39.38 Temp:     98.1 degrees F (36.72 degrees C) oral Pulse rate:   65 / minute BP sitting:   111 / 75  (right arm)  Pt. in pain?   yes    Location:   abdominal, lower    Intensity:   10    Type:       sharp  Vitals Entered By: Angelina Ok RN (May 20, 2007 10:31 AM)              Is Patient Diabetic? No Nutritional Status BMI of > 30 = obese  Have you ever been in a relationship where you felt threatened, hurt or afraid?No   Does patient need assistance? Functional Status Self care Ambulation Normal     PCP:  Valetta Close MD  Chief Complaint:  B/P check, Na usea and vomiting x 3 days, couple of runny stools, no fever, and B/P standing 110/75 P-90.  History of Present Illness: Jeanette Gamble is here with complaints of nausea, vomiting and increased frequency of urination for the last 3 days. She denies fever or chills or back pain or blood in the urine. She has been nauseous and vomited once yesterday. She is able to keep food down but gets nauseous.Denies burning urination or vaginal discharge. Her LMP was on Dec 1 and the patient is concerned that she might be pregnant as she has been having unprotected sex and not using any birth control.She also plans on getting pregnant over  the next 6-12 months   Prior Medications :  HYDROCHLOROTHIAZIDE 25 MG TABS (HYDROCHLOROTHIAZIDE) Take 1 tablet by mouth once a day LISINOPRIL 40 MG  TABS (LISINOPRIL) Take 1 tablet by mouth once a day IMITREX 50 MG TABS (SUMATRIPTAN SUCCINATE) by mouth daily as needed for migraines    Current Allergies: No known allergies   Past Medical History:    Reviewed history from 02/04/2006 and no changes required:       Hypertension       Ovarian cysts-benign by [place]       Preeclampsia       Smoking       Migraine headache   Trichomonas       Bradicardia                  Risk Factors:  Tobacco use:  current    Counseled to quit/cut down tobacco use:  yes   Review of Systems  The patient denies anorexia, fever, weight loss, vision loss, decreased hearing, hoarseness, chest pain, syncope, dyspnea on exhertion, peripheral edema, prolonged cough, hemoptysis, melena, hematochezia, severe indigestion/heartburn, hematuria, and incontinence.     Physical Exam  General:     alert.   Head:     normocephalic.   Eyes:     vision grossly intact.   Neck:     supple.   Lungs:     normal respiratory effort, no intercostal retractions, no accessory muscle use, normal breath sounds, and no dullness.   Heart:     normal rate, regular rhythm, no murmur, no gallop, no rub, and no JVD.   Abdomen:     soft, normal bowel sounds, no distention, no masses, no guarding, no rigidity, and no rebound tenderness.  slight suprapubic  tenderness Msk:     normal ROM and no joint tenderness.   Neurologic:     alert & oriented X3, cranial nerves II-XII intact, strength normal in all extremities, sensation intact to light touch, sensation intact to pinprick, and gait normal.      Impression & Recommendations:  Problem # 1:  DYSURIA (ICD-788.1) She may well have a urinary tract infection given the symptoms of  increased frequency of urination and suprapubic tenderness. The other possibility is pregnancy given the nausea, weakness, increased frequency of urination with unprotected sexual activity and amenorrhea. A urine pregnancy test was negative but patient indicates that the last time when she was pregnant the home urine pregnancy test remained negative for a long time and wants a second test to confirm. Will obtain a quantitative serum pregnancy test. WIll hold of on antibiotics until the result is back.Will also send U/A and urine culture Orders: T-Urine Pregnancy (in -house) [id number]) T-Culture, Urine  ([id number]) T-Urinalysis ([id number]) T-Basic Metabolic Panel ([id number]) T-Pregnancy (Serum), Quant. [id number])   Problem # 2:  HYPERTENSION (ICD-401.9) She desires pregnancy over the next 6-12 months. Will D/C lisinopril and continue HCTZ which is class B and norvasc which is class C. She will return for a repeat BP check in 2 weeks. The following medications were removed from the medication list:    Lisinopril 40 Mg Tabs (Lisinopril) .Marland Kitchen... Take 1 tablet by mouth once a day  Her updated medication list for this problem includes:    Hydrochlorothiazide 25 Mg Tabs (Hydrochlorothiazide) .Marland Kitchen... Take 1 tablet by mouth once a day    Norvasc 5 Mg Tabs (Amlodipine besylate) .Marland Kitchen... Take 1 tablet by mouth once a day  Orders: T-Basic Metabolic Panel [id number])   Problem # 3:  SMOKER (ICD-305.1) She has significantly cut down and is contemplating on quitting completely. Encouraged her to do so  Complete Medication List: 1)  Hydrochlorothiazide 25 Mg Tabs (Hydrochlorothiazide) .... Take 1 tablet by mouth once a day 2)  Norvasc 5 Mg Tabs (Amlodipine besylate) .... Take 1 tablet by mouth once a day   Patient Instructions: 1)  Please schedule a follow-up appointment in 2 weeks.    Prescriptions: NORVASC 5 MG  TABS (AMLODIPINE BESYLATE) Take 1 tablet by mouth once a day  #31 x 6   Entered and Authorized by:   Ronda Fairly MD   Signed by:   Ronda Fairly MD on 05/20/2007   Method used:   Electronically sent to ...       [address]       [address]       [place], [place]  [address]       Ph: [phone]       Fax: [phone]   RxID:   [id number]  ] Laboratory Results   Urine Tests  Date/Time Recieved: 05/20/07 10:42 AM GH Date/Time Reported: 05/20/07 10:40 AM GH  Routine Urinalysis   Color: yellow Appearance: Hazy Glucose: negative   (Normal Range: Negative) Bilirubin: negative   (Normal Range: Negative) Ketone: negative   (Normal Range: Negative) Spec.  Gravity: 1.020   (Normal Range: 1.003-1.035) Blood: small   (Normal Range: Negative) pH: 6.5   (Normal Range: 5.0-8.0) Protein: trace   (Normal Range: Negative) Urobilinogen: 0.2   (Normal Range: 0-1) Nitrite: negative   (Normal Range: Negative) Leukocyte Esterace: trace   (Normal Range: Negative)    Urine HCG: negative Comments: Urine Preg specific gravity 1.022 ..................................................................Marland KitchenAlric Quan  May 20, 2007 11:14 AM

## 2010-05-21 NOTE — Progress Notes (Signed)
Summary: phone/gg *  Phone Note Call from Patient   Caller: Patient Summary of Call: Pt wants appointment.  c/o  nausea and vomiting yesterday with cont. nausea and abd pain  today.  Also headache, congestion. Denies fever, diarrhea, or dizziness.  Pt # [id number] Initial call taken by: Merrie Roof RN,  June 14, 2008 10:14 AM  Follow-up for Phone Call        Tell pt to drink plenty of liquids. Try Tylenol for pain. Call if symptoms worsen. Follow-up by: Ned Grace MD,  June 14, 2008 10:31 AM  Additional Follow-up for Phone Call Additional follow up Details #1::        Pt informed Additional Follow-up by: Merrie Roof RN,  June 14, 2008 10:55 AM

## 2010-05-21 NOTE — Assessment & Plan Note (Signed)
Summary: ELEVATED BLOOD PRESSURE(COHEN)/DS   Vital Signs:  Patient Profile:   30 Years Old Female Weight:      252.0 pounds (114.55 kg) Temp:     97.1 degrees F (36.17 degrees C) oral Pulse rate:   72 / minute BP sitting:   166 / 111  (right arm)  Pt. in pain?   yes    Location:   h/a    Intensity:   10  Vitals Entered By: Youlanda Roys RN (April 05, 2007 4:14 PM)              Is Patient Diabetic? No Nutritional Status Detail so-so  Have you ever been in a relationship where you felt threatened, hurt or afraid?denies   Does patient need assistance? Functional Status Self care Ambulation Normal     Chief Complaint:  Discuss BP-out of meds 3 months. On Medicaid- family planning covers nomeds..  History of Present Illness: Jeanette Gamble is a 30 yr female with a h/o HTN, obesity, tobacco abuse. She has run out of her antihypertensives for the last 3 months. She has had headaches on and off for the same time. Denies vision problems or chest pain or SOB. She continues to smoke 2-3 packs per week.   Current Allergies (reviewed today): No known allergies   Past Medical History:    Reviewed history from 02/04/2006 and no changes required:       Hypertension       Ovarian cysts-benign by [place]       Preeclampsia       Smoking       Migraine headache       Trichomonas       Bradicardia                  Risk Factors:  Tobacco use:  current    Counseled to quit/cut down tobacco use:  yes   Review of Systems  The patient denies anorexia, fever, weight loss, vision loss, decreased hearing, hoarseness, chest pain, syncope, dyspnea on exhertion, peripheral edema, prolonged cough, hemoptysis, abdominal pain, melena, hematochezia, and severe indigestion/heartburn.     Physical Exam  General:     overweight-appearing.   Head:     normocephalic and atraumatic.   Eyes:     vision grossly intact and pupils equal.   Mouth:     good dentition.   Neck:     supple.     Lungs:     normal respiratory effort, no intercostal retractions, no accessory muscle use, and normal breath sounds.   Heart:     normal rate, regular rhythm, no murmur, no gallop, and no rub.   Abdomen:     soft, non-tender, normal bowel sounds, and no distention.   Msk:     normal ROM, no joint tenderness, no joint swelling, and no joint warmth.   Neurologic:     alert & oriented X3, cranial nerves II-XII intact, strength normal in all extremities, sensation intact to light touch, and sensation intact to pinprick.      Impression & Recommendations:  Problem # 1:  HYPERTENSION (ICD-401.9) BP poorly controlled but probably from the lack of medications. She is on "family medicaid" and does not qualify for prescription medications. She was advised that most of her medications were available at [facility] for $4. She will be restarted on Lisinopril and HCTZ only  and will be followed in a week to evaluate response. She was also given samples of those medications.  Will check a BMET today. She denies chest pain, SOB or vision problems.Also, I am not sure if she has had w/u for secondary causes of HTN. Will need to review her paper chart Her updated medication list for this problem includes:    Hydrochlorothiazide 25 Mg Tabs (Hydrochlorothiazide) .Marland Kitchen... Take 1 tablet by mouth once a day    Lisinopril 20 Mg Tabs (Lisinopril) .Marland Kitchen... Take 1 tablet by mouth once a day    Norvasc 10 Mg Tabs (Amlodipine besylate) .Marland Kitchen... Take 1 tablet by mouth once a day  Orders: T-Basic Metabolic Panel [id number])   Problem # 2:  SMOKER (ICD-305.1) Counselled on quitting tobacco use. Will discuss options in the next visit  Complete Medication List: 1)  Hydrochlorothiazide 25 Mg Tabs (Hydrochlorothiazide) .... Take 1 tablet by mouth once a day 2)  Lisinopril 20 Mg Tabs (Lisinopril) .... Take 1 tablet by mouth once a day 3)  Amitriptyline Hcl 50 Mg Tabs (Amitriptyline hcl) .... Take 1 tablet by mouth at bedtime 4)   Norvasc 10 Mg Tabs (Amlodipine besylate) .... Take 1 tablet by mouth once a day 5)  Reglan 5 Mg Tabs (Metoclopramide hcl) .... By mouth four as needed times a day 6)  Meclizine Hcl 25 Mg Tabs (Meclizine hcl) .... By mouth twice daily as needed for dizziness 7)  Imitrex 50 Mg Tabs (Sumatriptan succinate) .... By mouth daily as needed for migraines   Patient Instructions: 1)  Please schedule a follow-up appointment in 1 week 2)  Tobacco is very bad for your health and your loved ones! You Should stop smoking!. 3)  It is important that you exercise regularly at least 20 minutes 5 times a week. If you develop chest pain, have severe difficulty breathing, or feel very tired , stop exercising immediately and seek medical attention.    Prescriptions: LISINOPRIL 20 MG TABS (LISINOPRIL) Take 1 tablet by mouth once a day  #31 x 5   Entered and Authorized by:   Ronda Fairly MD   Signed by:   Ronda Fairly MD on 04/05/2007   Method used:   Electronically sent to ...       [address]*       [address]       [place], [place]  [address]       Ph: [phone]       Fax: [phone]   RxID:   [id number] HYDROCHLOROTHIAZIDE 25 MG TABS (HYDROCHLOROTHIAZIDE) Take 1 tablet by mouth once a day  #31 x 5   Entered and Authorized by:   Ronda Fairly MD   Signed by:   Ronda Fairly MD on 04/05/2007   Method used:   Electronically sent to ...       [address]*       [address]       [place], [place]  [address]       Ph: [phone]       Fax: [phone]   RxID:   [id number]  ]

## 2010-05-21 NOTE — Progress Notes (Signed)
Summary: c/o chronic HA, blurry vision and dizziness  Phone Note Call from Patient   Caller: Patient Reason for Call: Talk to Doctor Summary of Call: Ms. Csaszar called while the [facility] phones were turned off for lunch because she was concerned about a HA, dizziness and blurry vision and whether or not she should go to the ER.  Upon further questioning, she states these sx have been going on for months now.  She has a hx of htn and went to the gyn yesterday to have her IUD removed and they wouldn't remove it because her BP was elevated at 151/104.  She has no new complaints except some SOB at rest, but she continues to smoke cigarettes.  She is 30 yo and is obese, but states she does a lot of walking and has lost about 30-40 pounds recently.  She states that Aleve usually works for her HA, but she hasn't taken any yet today.  Her HA are daily, present when she wakes up and last all day and are present when she goes to bed.   She is able to sleep through the night if she takes her Amitriptyline and is not ever woken out of sleep with her HA's.  Given the chronicity of her complaints, I advised the pt to take some Aleve for her HA and to wait until she could be seen in the [facility] to be evaluated instead of going to the ED.  She asked if she would be able to be seen today - if not, she needs to be seen by Monday 7/7 if possible.  I also advised the pt to go to the ED if at anytime she felt her sx could not wait to be addressed until her appt.  She expressed understanding.  Please call the pt today with info on when she will be able to be seen.  Thank you. Initial call taken by: Chauncey Reading D.O.,  October 22, 2006 1:53 PM  Follow-up for Phone Call        Called and spoke with a female who said she would give the pt the appt for Monday at 3:15 pm. Follow-up by: Henderson Cloud,  October 22, 2006 4:46 PM

## 2010-05-21 NOTE — Progress Notes (Signed)
Summary: phone/gg  Phone Note Call from Patient   Summary of Call: Pt called with c/o headache, nausea and abd pain.  Onset yesterday.  would like to be seen. will see today Initial call taken by: Merrie Roof RN,  February 14, 2009 9:41 AM

## 2010-05-21 NOTE — Progress Notes (Signed)
Summary: med change/gp  Phone Note Refill Request Message from:  Fax from Pharmacy on April 30, 2009 5:06 PM  Refills Requested: Medication #1:  CAFERGOT 1-100 MG TABS take 2 tabs by mouth at onset of headache This is on backorder.  Can you change Rx to something else per pharmacy.   Method Requested: Electronic Initial call taken by: Chinita Pester RN,  April 30, 2009 5:05 PM  Follow-up for Phone Call        Tried calling pt to ask about prior medications for migraines, especially imitrex but could not reach her.  Also would like to know what her family planning situation is (contraception, pregnant, or attempting?). Once clarified, will make decision as to what medication to use.  If she did well and is using contraception, could use imitrex.   Of note, prior notes state that she was attempting to become pregnant and cafergot is contraindicated during pregnancy! Follow-up by: Mariea Stable MD,  May 02, 2009 2:42 PM  Additional Follow-up for Phone Call Additional follow up Details #1::        Spoke with patient.  Reports she has daily headaches and uses cafergot daily.  Not planning on getting pregnant but sexually active without contraception.  She was informed that cafergot is contraindicated during pregnancy.  She wishes to get on OCP.  Will discuss of upcoming visit (1/21) Additional Follow-up by: Mariea Stable MD,  May 02, 2009 4:55 PM

## 2010-05-21 NOTE — Progress Notes (Signed)
Summary: refill/gg  Phone Note Refill Request  on September 20, 2008 5:24 PM  Refills Requested: Medication #1:  HYDROCHLOROTHIAZIDE 25 MG TABS Take 1 tablet by mouth once a day   Dosage confirmed as above?Dosage Confirmed   Brand Name Necessary? No   Supply Requested: 1 month   Last Refilled: 07/12/2007  Medication #2:  NORVASC 5 MG  TABS Take 1 tablet by mouth once a day.   Dosage confirmed as above?Dosage Confirmed   Brand Name Necessary? No   Supply Requested: 1 month   Last Refilled: 05/20/2007 Please note last refill   Method Requested: Electronic Initial call taken by: Merrie Roof RN,  September 20, 2008 5:24 PM      Prescriptions: NORVASC 5 MG  TABS (AMLODIPINE BESYLATE) Take 1 tablet by mouth once a day  #31 x 1   Entered and Authorized by:   Valetta Close MD   Signed by:   Valetta Close MD on 09/21/2008   Method used:   Electronically to        [facility] [phone]* (retail)       [address]       [place], [place]  [address]       Ph: [phone]       Fax: [phone]   RxID:   [id number] HYDROCHLOROTHIAZIDE 25 MG TABS (HYDROCHLOROTHIAZIDE) Take 1 tablet by mouth once a day  #31 x 1   Entered and Authorized by:   Valetta Close MD   Signed by:   Valetta Close MD on 09/21/2008   Method used:   Electronically to        [facility] [phone]* (retail)       [address]       [place], [place]  [address]       Ph: [phone]       Fax: [phone]   RxID:   [id number]

## 2010-05-21 NOTE — Assessment & Plan Note (Signed)
Summary: EST-CK/FU/MEDS/CFB   Vital Signs:  Patient profile:   30 year old female Height:      66.4 inches (168.66 cm) Weight:      276.05 pounds (125.48 kg) BMI:     44.18 Temp:     98..2 degrees F (-17.78 degrees C) oral Pulse rate:   70 / minute BP sitting:   151 / 100  (right arm)  Vitals Entered By: Angelina Ok RN (June 21, 2009 1:49 PM) Is Patient Diabetic? No Pain Assessment Patient in pain? no      Nutritional Status BMI of > 30 = obese  Have you ever been in a relationship where you felt threatened, hurt or afraid?No   Does patient need assistance? Functional Status Self care Ambulation Normal Comments Has bloody taste in mouth all the time.  Nausea daily with no vomiting.  Has 1-2 diarrhea stools daily.  No fevers.  Has abdominal pain-more after eating to a scale of 10.  Not voiding as frequentlty as before.  Thinks she has an Ovarian Cyst.   Primary Care Provider:  Valetta Close MD   History of Present Illness: Mrs Spark is a 30 yo woman with PMH as outlined below.  She is here for f/u on her BP and also has some complaints of nausea and a "taste of blood" all the time.  She reports having "spit up blood" after having an argument with a family member.  Not a large amount and has been the only episode.  All this started near the end of january.  This symptoms are all day every day.  Food does not alter symptoms.  Some mid abdominal pain, constant without alleviating or aggrevating symptoms.  Reports periods are regular, last was 2nd week of Febuary.  Sexually active, no contraception, but does not want to get pregnant.  Only change is periods are heavier and more painful.  Currently uses 1-1 1/2 boxes of tampons (approx 20/box).     Depression History:      The patient denies a depressed mood most of the day and a diminished interest in her usual daily activities.         Preventive Screening-Counseling & Management  Alcohol-Tobacco     Smoking Status: quit    Smoking Cessation Counseling: yes     Packs/Day: 1-2 ppd  Comments: Stopped smoking 04/20/09  Current Medications (verified): 1)  Hydrochlorothiazide 25 Mg Tabs (Hydrochlorothiazide) .... Take 1 Tablet By Mouth Once A Day 2)  Norvasc 10 Mg Tabs (Amlodipine Besylate) .... Take One Tablet Daily.  Allergies (verified): No Known Drug Allergies  Past History:  Social History: Last updated: 04/26/2009 has a 23 year old son (2011) just quit smoking  Risk Factors: Smoking Status: quit (06/21/2009) Packs/Day: 1-2 ppd (06/21/2009)  Past Medical History: Hypertension Ovarian cysts-benign by [place] Preeclampsia Smoking alcohol:  quit 03/2008 (drank 1-2 bottles liquor daily) Migraine headache Trichomonas Bradicardia  Past Surgical History: c section 2003  Review of Systems      See HPI  Physical Exam  General:  obese, alert and cooperative to examination.   Eyes:  vision grossly intact, pupils equal, pupils round, and pupils reactive to light.  anicteric Neck:  supple, no masses, no thyromegaly, and no carotid bruits.   Lungs:  normal respiratory effort, no accessory muscle use, normal breath sounds, no crackles, and no wheezes.   Heart:  normal rate, regular rhythm, no murmur, no gallop, and no rub.   Abdomen:  soft, normal bowel  sounds, no distention, no guarding, and no rebound tenderness.  Pt reports mild tenderness throughout.  Unable to assess for organomegaly Extremities:  trace edema bilaterally Neurologic:  alert & oriented X3, cranial nerves II-XII intact, and strength normal in all extremities.   Psych:  Oriented X3, memory intact for recent and remote, and normally interactive.     Impression & Recommendations:  Problem # 1:  HYPERTENSION (ICD-401.9) Not at goal Pt not feeling well today Will reassess next visit, if still above goal, will start another agent (of note, pt not on contraceptives)  Her updated medication list for this problem includes:     Hydrochlorothiazide 25 Mg Tabs (Hydrochlorothiazide) .Marland Kitchen... Take 1 tablet by mouth once a day    Norvasc 10 Mg Tabs (Amlodipine besylate) .Marland Kitchen... Take one tablet daily.  Orders: T-Comprehensive Metabolic Panel [id number])  BP today: 151/100 Prior BP: 171/118 (04/26/2009)  Labs Reviewed: K+: 4.1 (02/15/2009) Creat: : 1.10 (02/15/2009)     Problem # 2:  NAUSEA (ICD-787.02) unclear etiology will check UA and UPT will also check CMET and lipase to r/o hepatobiliary pathology or pancreatitis empiric omeprazole for now check CBC given report of "spitting up blood" and heavier periods  Orders: T-Urine Pregnancy (in -house) (09811) T-Comprehensive Metabolic Panel [id number]) T-CBC w/Diff ([id number]) T-Lipase ([id number]) T-Urinalysis Dipstick only ([id number])  Problem # 3:  MENORRHAGIA (ICD-626.2) check CBC, PT, PTT Pap/pelvic done in January May need GYN referral vs [place] given habitus to accurately assess for pelvic pathology  Orders: T-PTT ([id number]) T-Protime, Auto ([id number])  Complete Medication List: 1)  Hydrochlorothiazide 25 Mg Tabs (Hydrochlorothiazide) .... Take 1 tablet by mouth once a day 2)  Norvasc 10 Mg Tabs (Amlodipine besylate) .... Take one tablet daily. 3)  Omeprazole 20 Mg Cpdr (Omeprazole) .... Take 1 tablet by mouth once a day  Patient Instructions: 1)  Please schedule a follow-up appointment in 2 weeks to reassess nausea 2)  Will check some blood tests today, if there is any abnormality, we will call 3)  If your symptoms get worse, call clinic.  4)  Continue with your medications listed below. 5)  If you could be exposed to sexually transmitted diseases, you should use a condom. 6)  If you are having sex and you or your partner don't want a child, use contraception. Prescriptions: OMEPRAZOLE 20 MG CPDR (OMEPRAZOLE) Take 1 tablet by mouth once a day  #30 x 1   Entered and Authorized by:   Mariea Stable MD   Signed by:   Mariea Stable MD on  06/21/2009   Method used:   [facility]        [facility]  [address] [phone]* (retail)       [address]       [place], [place]  [address]       Ph: [phone]       Fax: [phone]   RxID:   [id number]   Prevention & Chronic Care Immunizations   Influenza vaccine: Not documented    Tetanus booster: Not documented    Pneumococcal vaccine: Not documented  Other Screening   Pap smear: NEGATIVE FOR INTRAEPITHELIAL LESIONS OR MALIGNANCY.  (04/26/2009)   Smoking status: quit  (06/21/2009)  Hypertension   Last Blood Pressure: 151 / 100  (06/21/2009)   Serum creatinine: 1.10  (02/15/2009)   Serum potassium 4.1  (02/15/2009) CMP ordered   Self-Management Support :    Patient will work on the following items  until the next clinic visit to reach self-care goals:     Medications and monitoring: take my medicines every day, bring all of my medications to every visit  (06/21/2009)     Eating: drink diet soda or water instead of juice or soda, eat more vegetables, eat foods that are low in salt, eat baked foods instead of fried foods, eat fruit for snacks and desserts, limit or avoid alcohol  (06/21/2009)     Activity: take a 30 minute walk every day  (06/21/2009)    Hypertension self-management support: Written self-care plan, Education handout  (06/21/2009)   Hypertension self-care plan printed.   Hypertension education handout printed  Process Orders Check Orders Results:     [facility]: ABN not required for this insurance Tests Sent for requisitioning (June 21, 2009 2:42 PM):     06/21/2009: [facility] -- T-Comprehensive Metabolic Panel [[id number]] (signed)     06/21/2009: [facility] -- T-CBC w/Diff [[id number]] (signed)     06/21/2009: [facility] -- T-Lipase [phone] (signed)     06/21/2009: [facility] -- T-PTT [[id number]] (signed)     06/21/2009:  [facility] -- T-Protime, Scarlette Calico [[id number]] (signed)    Appended Document: Results of Urine Pregnancy and Urine Dip    Lab Visit  Laboratory Results   Urine Tests  Date/Time Received: June 21, 2009 3:47 PM  Date/Time Reported: Burke Keels  June 21, 2009 3:47 PM   Routine Urinalysis   Color: yellow Appearance: Cloudy Glucose: negative   (Normal Range: Negative) Bilirubin: negative   (Normal Range: Negative) Ketone: negative   (Normal Range: Negative) Spec. Gravity: 1.025   (Normal Range: 1.003-1.035) Blood: trace-intact   (Normal Range: Negative) pH: 5.0   (Normal Range: 5.0-8.0) Protein: negative   (Normal Range: Negative) Urobilinogen: 0.2   (Normal Range: 0-1) Nitrite: negative   (Normal Range: Negative) Leukocyte Esterace: negative   (Normal Range: Negative)    Urine HCG: negative Comments: Urine Specific Gravity 1.025 Vickie Maxine Glenn  June 21, 2009 3:50 PM    Orders Today:

## 2010-05-21 NOTE — Progress Notes (Signed)
Summary: Results  Phone Note Outgoing Call   Call placed by: Angelina Ok RN,  May 21, 2007 2:44 PM Call placed to: Patient Summary of Call: Call to pt to inform her of her lab results.  Her pregnancy test was negative and that she has a UTI.  The UTI requires treatment with antibiotics.  The pt was told that Cipro had been ordered and sent to her pharmacy.  She take 1 tablet twice daily for 3 days.  Pt voiced understanding of plan. ..................................................................Marland KitchenAngelina Ok RN  May 21, 2007 3:07 PM  Initial call taken by: Angelina Ok RN,  May 21, 2007 3:08 PM

## 2010-05-21 NOTE — Assessment & Plan Note (Signed)
Summary: acute-f/u with bp and meds/cfb(alvarez)/cfb   Vital Signs:  Patient profile:   30 year old female Height:      66.4 inches (168.66 cm) Weight:      275.1 pounds (125.05 kg) BMI:     44.03 Temp:     97.3 degrees F (36.28 degrees C) oral Pulse rate:   70 / minute BP sitting:   171 / 118  (right arm)  Vitals Entered By: Stanton Kidney Ditzler RN (April 26, 2009 5:12 PM) CC: Depression Is Patient Diabetic? No Pain Assessment Patient in pain? yes     Location: chest Intensity: 6 Onset of pain  better since stop smoking Nutritional Status BMI of > 30 = obese Nutritional Status Detail appetite good  Have you ever been in a relationship where you felt threatened, hurt or afraid?denies   Does patient need assistance? Functional Status Self care Ambulation Normal Comments FU  - better - stopped smoking 03/2009. c/o cottage cheese white vag discharge.   Primary Care Provider:  Valetta Close MD  CC:  Depression.  History of Present Illness: This is a  year old woman with past medical history of HTN and recent treatment for trichomonas.  She is here for check up and to discuss continued vaginal discharge.  She just got treated for trich by the ED - but now has cottage cheese discharge noodor,  itching or burning  Her BP is up and she has been taking her BP meds, though she needs refills.  No headache or visual change.  She has stopped smoking and is now walking an hour a day!  Depression History:      The patient denies a depressed mood most of the day and a diminished interest in her usual daily activities.         Preventive Screening-Counseling & Management  Alcohol-Tobacco     Smoking Status: quit     Smoking Cessation Counseling: yes     Packs/Day: 1-2 ppd  Current Medications (verified): 1)  Hydrochlorothiazide 25 Mg Tabs (Hydrochlorothiazide) .... Take 1 Tablet By Mouth Once A Day 2)  Norvasc 5 Mg  Tabs (Amlodipine Besylate) .... Take 1 Tablet By Mouth Once A  Day 3)  Prilosec 20 Mg Cpdr (Omeprazole) .... Take 1 Pill By Mouth Two Times A Day. 4)  Amitriptyline Hcl 10 Mg Tabs (Amitriptyline Hcl) .... Take 1 Pill By Mouth At Bedtime. 5)  Cafergot 1-100 Mg Tabs (Ergotamine-Caffeine) .... Take 2 Tabs By Mouth At Onset of Headache, Then 1 Tablet Every 30 Minutes As Needed. Don't Exceed 6 Tablets Per Attack and 10 Tabs Per Week.  Allergies (verified): No Known Drug Allergies  Social History: has a 52 year old son (2011) just quit smokingSmoking Status:  quit  Review of Systems       per hpi  Physical Exam  General:  alert and overweight-appearing.   Lungs:  normal respiratory effort and normal breath sounds.   Heart:  normal rate, regular rhythm, and no murmur.   Abdomen:  soft, non-tender, and normal bowel sounds.   Genitalia:  normal introitus and no external lesions.  thick clumpy white discharge on vaginal walls. cervix is very small and soft. no vaginal or cervical lesions. No adnexal or cervical tenderness. Extremities:  no edema Neurologic:  alert & oriented X3, cranial nerves II-XII intact, and strength normal in all extremities.   Skin:  no suspicious lesions.   Psych:  Oriented X3, memory intact for recent and remote, and normally  interactive.     Impression & Recommendations:  Problem # 1:  HYPERTENSION (ICD-401.9) BP still elevated. increase norvasc to 10mg  daily rtc in 2 weeks for bp recheck.  Her updated medication list for this problem includes:    Hydrochlorothiazide 25 Mg Tabs (Hydrochlorothiazide) .Marland Kitchen... Take 1 tablet by mouth once a day    Norvasc 10 Mg Tabs (Amlodipine besylate) .Marland Kitchen... Take one tablet daily.  Problem # 2:  OBESITY, MORBID (ICD-278.01) walking one hour a day. motivated to change lifestyle for better health.  Problem # 3:  VAGINAL DISCHARGE (ICD-623.5) Will check wet prep today as she is concerned that she may still have trich.  More likely has yeast infection.    Orders: T-Wet Prep by Molecular  Probe [id number])  Complete Medication List: 1)  Hydrochlorothiazide 25 Mg Tabs (Hydrochlorothiazide) .... Take 1 tablet by mouth once a day 2)  Norvasc 10 Mg Tabs (Amlodipine besylate) .... Take one tablet daily. 3)  Prilosec 20 Mg Cpdr (Omeprazole) .... Take 1 pill by mouth two times a day. 4)  Amitriptyline Hcl 10 Mg Tabs (Amitriptyline hcl) .... Take 1 pill by mouth at bedtime. 5)  Cafergot 1-100 Mg Tabs (Ergotamine-caffeine) .... Take 2 tabs by mouth at onset of headache, then 1 tablet every 30 minutes as needed. don't exceed 6 tablets per attack and 10 tabs per week.  Other Orders: T-PAP [facility]) [id number])  Patient Instructions: 1)  You had labwork today.  We will call you if there is anything that needs to be addressed 2)  You have prescriptions at the pharmacy. 3)  Please schedule a follow-up appointment in 2 weeks to check blood pressure. Prescriptions: HYDROCHLOROTHIAZIDE 25 MG TABS (HYDROCHLOROTHIAZIDE) Take 1 tablet by mouth once a day  #30 x 1   Entered and Authorized by:   Elby Showers MD   Signed by:   Elby Showers MD on 04/26/2009   Method used:   [facility]  [address] [phone][facility] (retail)       [address]       [place], [place]  [address]       Ph: [phone]       Fax: [phone]   RxID:   [id number] PRILOSEC 20 MG CPDR (OMEPRAZOLE) take 1 pill by mouth two times a day.  #60 x 2   Entered and Authorized by:   Elby Showers MD   Signed by:   Elby Showers MD on 04/26/2009   Method used:   [facility]        [facility]  [address] [phone]* (retail)       [address]       [place], [place]  [address]       Ph: [phone]       Fax: [phone]   RxID:   [id number] CAFERGOT 1-100 MG TABS (ERGOTAMINE-CAFFEINE) take 2 tabs by mouth at onset of headache, then 1 tablet every 30 minutes as needed. Don't exceed 6 tablets per attack and 10 tabs per week.  #30 x 1   Entered and  Authorized by:   Elby Showers MD   Signed by:   Elby Showers MD on 04/26/2009   Method used:   [facility]        [facility]  [address] [phone] ([facility]ail)       [address] [facility]  [address]       [place], [place]  [address]       Ph: [phone]       Fax: [phone]   RxID:   [id number] AMITRIPTYLINE HCL 10 MG TABS (AMITRIPTYLINE HCL) take 1 pill by mouth at bedtime.  #30 x 1   Entered and Authorized by:   Elby Showers MD   Signed by:   Elby Showers MD on 04/26/2009   Method used:   [facility]        [facility]  [address] [phone]* (retail)       [address]       [place], [place]  [address]       Ph: [phone]       Fax: [phone]   RxID:   [id number] NORVASC 10 MG TABS (AMLODIPINE BESYLATE) Take one tablet daily.  #32 x 3   Entered and Authorized by:   Elby Showers MD   Signed by:   Elby Showers MD on 04/26/2009   Method used:   [facility]        [facility]  [address] [phone]* (retail)       [address]       [place], [place]  [address]       Ph: [phone]       Fax: [phone]   RxID:   [id number]  Process Orders Check Orders Results:     [facility]: ABN not required for this insurance Tests Sent for requisitioning (April 26, 2009 9:11 PM):     04/26/2009: [facility] -- T-Wet Prep by Molecular [facility] [phone] (signed)

## 2010-05-21 NOTE — Progress Notes (Signed)
Summary: Refill/gh  Phone Note Refill Request Message from:  Pharmacy on April 05, 2007 12:25 PM  Refills Requested: Medication #1:  LISINOPRIL 20 MG TABS Take 1 tablet by mouth once a day   Dosage confirmed as above?Dosage Confirmed   Brand Name Necessary? No   Supply Requested: 1 year   Last Refilled: 10/19/2006  Method Requested: Electronic Initial call taken by: Angelina Ok RN,  April 05, 2007 12:25 PM  Follow-up for Phone Call        This was addressed in her office visit with Dr. Silvestre Mesi.

## 2010-05-21 NOTE — Progress Notes (Signed)
  Phone Note Call from Patient   Caller: Patient Reason for Call: Refill Medication Summary of Call: pt calls, wants 3 months worth of bp meds, after looking at chart pt was informed she needed an appt, appt sch  for 9/3, dr Noel Gerold Initial call taken by: Marin Roberts RN,  December 16, 2006 4:11 PM

## 2010-05-21 NOTE — Assessment & Plan Note (Signed)
Summary: headache/gg   Vital Signs:  Patient profile:   30 year old female Height:      66.4 inches (168.66 cm) Weight:      266.7 pounds (121.23 kg) BMI:     42.68 Temp:     97.1 degrees F (36.17 degrees C) oral Pulse rate:   59 / minute BP sitting:   167 / 112  (right arm)  Vitals Entered By: Stanton Kidney Ditzler RN (February 14, 2009 11:18 AM) Is Patient Diabetic? No Pain Assessment Patient in pain? yes     Location: h/a Intensity: 10 Onset of pain  this AM Nutritional Status BMI of > 30 = obese Nutritional Status Detail appetite ok  Have you ever been in a relationship where you felt threatened, hurt or afraid?denies   Does patient need assistance? Functional Status Self care Ambulation Normal Comments H/a this AM, nausea and right abd pain since 02/13/09. Contra none - missed pd in 9/10 LMP 2 weeks ago. No problems with BM or urine. Has been out on meds since 1/09 ( last office vs ) - no money.   Primary Care Provider:  Valetta Close MD   History of Present Illness: [name] is a 30 yo lady with PMH as outlined in the EMR comes today for a f/u visit.   1. HTN: Pt has not taken he HCTZ and norvasc from 1/10. She can't afford the meds.  2. HA: HA started this morning, but is on/off. It's on the forehead. It feels like somebody trying to hit with a hammer. Light, sound and any kind of noise makes it worse. No relation with food. Menses exacerbate the headache. Quiet and darkness makes the HA better. She also has some blurry vision, when she has ha. She has nausea but no vomiting. It's so common now that it's present about every day. She also had dizziness this morning. She takes ibuprofen, advil and tylenol whatever she can find when she has acute HA.   3. Abdominal pain: The pain is on the periumbilical pain since yesterday. It feels like "somebody twisting my inside". It's constant. No radiation. No aggravating or releiving factors. No diarrhea. No vaginal bleeding/discharge.  Normally her period is regular. She didnot have periods on 9/10 and had one 2 wks ago. She is sexually active with one partner and she doesn't use any condom. No dysuria. She has urinary frequency though. No fever or chills.   4. Tobacco abuse: She smokes 1 PPD. She wants to try mechanical ciggarettes to help quit.   5. Obesity: She walks a lot. She walks to her job which is about 6-7 blocks two way daily.   Depression History:      The patient denies a depressed mood most of the day and a diminished interest in her usual daily activities.         Preventive Screening-Counseling & Management  Alcohol-Tobacco     Smoking Status: current     Smoking Cessation Counseling: yes     Packs/Day: 1-2 ppd  Current Medications (verified): 1)  Hydrochlorothiazide 25 Mg Tabs (Hydrochlorothiazide) .... Take 1 Tablet By Mouth Once A Day 2)  Norvasc 5 Mg  Tabs (Amlodipine Besylate) .... Take 1 Tablet By Mouth Once A Day 3)  Prilosec 20 Mg Cpdr (Omeprazole) .... Take 1 Pill By Mouth Two Times A Day. 4)  Amitriptyline Hcl 10 Mg Tabs (Amitriptyline Hcl) .... Take 1 Pill By Mouth At Bedtime. 5)  Cafergot 1-100 Mg Tabs (Ergotamine-Caffeine) .... Take  2 Tabs By Mouth At Onset of Headache, Then 1 Tablet Every 30 Minutes As Needed. Don't Exceed 6 Tablets Per Attack and 10 Tabs Per Week.  Allergies: No Known Drug Allergies  Social History: Packs/Day:  1-2 ppd  Review of Systems      See HPI  Physical Exam  General:  alert.   Nose:  no sinus percussion tenderness.   Mouth:  pharynx pink and moist.   Lungs:  normal breath sounds, no crackles, and no wheezes.   Heart:  normal rate, regular rhythm, no murmur, no gallop, and no rub.   Abdomen:  soft, normal bowel sounds, no distention, no masses, no guarding, no rigidity, and no rebound tenderness.  Mild suprapubic and epigastric tenderness. No renal angle tenderness.  Extremities:  trace left pedal edema and trace right pedal edema.   Neurologic:  alert  & oriented X3, cranial nerves II-XII intact, strength normal in all extremities, sensation intact to light touch, gait normal, finger-to-nose normal, and toes down bilaterally on Babinski.   Cervical Nodes:  no anterior cervical adenopathy and no posterior cervical adenopathy.     Impression & Recommendations:  Problem # 1:  OBESITY, MORBID (ICD-278.01) Discussed at length regarding the consequences of obesity and the urgency to lose wt. Referred to D. Riley's class.  Orders: T-Comprehensive Metabolic Panel [phone]) T-TSH [phone]) T-Hgb A1C (in-house) ([id number])  Ht: 66.4 (02/14/2009)   Wt: 266.7 (02/14/2009)   BMI: 42.68 (02/14/2009)  Problem # 2:  DYSURIA (ICD-788.1) Check for followings.  Orders: T-CBC No Diff ([id number]) T-Urinalysis ([id number]) T-Culture, Urine ([id number])  Problem # 3:  COMMON MIGRAINE (ICD-346.10) I think pt has migraine and this is very frequent. Will try cafergot, which is cheap to abort the acute attack and try low dose amytriptilline for prevention. Can increase the dose of latter if still recurrs in about a month.  Her updated medication list for this problem includes:    Cafergot 1-100 Mg Tabs (Ergotamine-caffeine) .Marland Kitchen... Take 2 tabs by mouth at onset of headache, then 1 tablet every 30 minutes as needed. don't exceed 6 tablets per attack and 10 tabs per week.  Problem # 4:  SMOKER (ICD-305.1) Discussed at length regarding the need to quit smoking and its consequences if not quit. She's going to try "mechanical ciggarettes" to quit.   Problem # 5:  HYPERTENSION (ICD-401.9) Pt not taking any meds since 1/10. I explained to her the natural course of untreated HTN and the need to treat it ASAP. She will try followings and check her BP in 2 wks.  Her updated medication list for this problem includes:    Hydrochlorothiazide 25 Mg Tabs (Hydrochlorothiazide) .Marland Kitchen... Take 1 tablet by mouth once a day    Norvasc 5 Mg Tabs (Amlodipine besylate) .Marland Kitchen...  Take 1 tablet by mouth once a day  Orders: T-Comprehensive Metabolic Panel ([id number])  Problem # 6:  Preventive Health Care (ICD-V70.0) Discuss at next visit. Will check FLP and A1c today.   Problem # 7:  PELVIC PAIN (ICD-625.9) I think she has pain from ?UTI and some epigastric pain as well. Will try some PPI.   Complete Medication List: 1)  Hydrochlorothiazide 25 Mg Tabs (Hydrochlorothiazide) .... Take 1 tablet by mouth once a day 2)  Norvasc 5 Mg Tabs (Amlodipine besylate) .... Take 1 tablet by mouth once a day 3)  Prilosec 20 Mg Cpdr (Omeprazole) .... Take 1 pill by mouth two times a day. 4)  Amitriptyline Hcl 10 Mg Tabs (Amitriptyline hcl) .Marland KitchenMarland KitchenMarland Kitchen  Take 1 pill by mouth at bedtime. 5)  Cafergot 1-100 Mg Tabs (Ergotamine-caffeine) .... Take 2 tabs by mouth at onset of headache, then 1 tablet every 30 minutes as needed. don't exceed 6 tablets per attack and 10 tabs per week.  Patient Instructions: 1)  Please register for healthy lifestyle class.  2)  Please schedule a follow-up appointment in 2 weeks. 3)  Limit your Sodium (Salt) to less than 2 grams a day(slightly less than 1/2 a teaspoon) to prevent fluid retention, swelling, or worsening of symptoms. 4)  Tobacco is very bad for your health and your loved ones! You Should stop smoking!. 5)  Stop Smoking Tips: Choose a Quit date. Cut down before the Quit date. decide what you will do as a substitute when you feel the urge to smoke(gum,toothpick,exercise). 6)  It is important that you exercise regularly at least 20 minutes 5 times a week. If you develop chest pain, have severe difficulty breathing, or feel very tired , stop exercising immediately and seek medical attention. 7)  You need to lose weight. Consider a lower calorie diet and regular exercise.  8)  Check your Blood Pressure regularly. If it is above: you should make an appointment. Prescriptions: CAFERGOT 1-100 MG TABS (ERGOTAMINE-CAFFEINE) take 2 tabs by mouth at onset of  headache, then 1 tablet every 30 minutes as needed. Don't exceed 6 tablets per attack and 10 tabs per week.  #30 x 1   Entered and Authorized by:   Jason Coop MD   Signed by:   Jason Coop MD on 02/14/2009   Method used:   Print then Give to Patient   RxID:   [id number] AMITRIPTYLINE HCL 10 MG TABS (AMITRIPTYLINE HCL) take 1 pill by mouth at bedtime.  #30 x 1   Entered and Authorized by:   Jason Coop MD   Signed by:   Jason Coop MD on 02/14/2009   Method used:   Print then Give to Patient   RxID:   [id number] PRILOSEC 20 MG CPDR (OMEPRAZOLE) take 1 pill by mouth two times a day.  #60 x 2   Entered and Authorized by:   Jason Coop MD   Signed by:   Jason Coop MD on 02/14/2009   Method used:   Print then Give to Patient   RxID:   [id number] NORVASC 5 MG  TABS (AMLODIPINE BESYLATE) Take 1 tablet by mouth once a day  #30 x 1   Entered and Authorized by:   Jason Coop MD   Signed by:   Jason Coop MD on 02/14/2009   Method used:   Print then Give to Patient   RxID:   [id number] HYDROCHLOROTHIAZIDE 25 MG TABS (HYDROCHLOROTHIAZIDE) Take 1 tablet by mouth once a day  #30 x 1   Entered and Authorized by:   Jason Coop MD   Signed by:   Jason Coop MD on 02/14/2009   Method used:   Print then Give to Patient   RxID:   [id number]  Process Orders Check Orders Results:     [facility]: ABN not required for this insurance Tests Sent for requisitioning (February 14, 2009 12:16 PM):     02/14/2009: [facility] -- T-Comprehensive Metabolic Panel [[id number]] (signed)     02/14/2009: [facility] -- T-CBC No Diff [[id number]] (signed)     02/14/2009: [facility] -- T-TSH [phone] (signed)     02/14/2009: [facility] -- T-Urinalysis [[id number]] (signed)     02/14/2009:  [facility] -- T-Culture,  Urine [phone] (signed)    Process Orders Check Orders Results:     [facility]: ABN not required for this insurance Tests Sent for requisitioning (February 14, 2009 12:16 PM):     02/14/2009: [facility] -- T-Comprehensive Metabolic Panel [[id number]] (signed)     02/14/2009: [facility] -- T-CBC No Diff [[id number]] (signed)     02/14/2009: [facility] -- T-TSH [phone] (signed)     02/14/2009: [facility] -- T-Urinalysis [[id number]] (signed)     02/14/2009: [facility] -- T-Culture, Urine [[id number]] (signed)   Appended Document: a1c results    Lab Visit  Laboratory Results   Blood Tests   Date/Time Received: February 14, 2009 12:47 PM Date/Time Reported: Alric Quan  February 14, 2009 12:47 PM  HGBA1C: 5.2%   (Normal Range: Non-Diabetic - 3-6%   Control Diabetic - 6-8%)    Orders Today:

## 2010-05-23 NOTE — Progress Notes (Signed)
  Phone Note Outgoing Call   Summary of Call: Called patient.  Phone said unavailable.  Unable to leave message.

## 2010-05-23 NOTE — Assessment & Plan Note (Signed)
Summary: ACUTE-YEARLY CHECK UP/WANTS TO DISCUSS BIRTH CONTROL(ALVAREZ)...   Vital Signs:  Patient profile:   30 year old female Height:      66.4 inches (168.66 cm) Weight:      281.0 pounds (127.73 kg) BMI:     44.97 BSA:     2.32 Temp:     97.9 degrees F oral Pulse rate:   83 / minute BP sitting:   141 / 95  (right arm) Cuff size:   large  Vitals Entered By: Chinita Pester RN (April 02, 2010 10:52 AM) CC: F/U visit.   Physical. Chest pain x 1 week. Refill on BP med x 3months. Request birth control. Is Patient Diabetic? No Pain Assessment Patient in pain? no      Nutritional Status BMI of > 30 = obese  Have you ever been in a relationship where you felt threatened, hurt or afraid?No   Does patient need assistance? Functional Status Self care Ambulation Normal   Primary Care Provider:  Valetta Close MD  CC:  F/U visit.   Physical. Chest pain x 1 week. Refill on BP med x 3months. Request birth control.Marland Kitchen  History of Present Illness: 30 y/o AA woman with a Hx of smoking, ETOH presents with 1 week Hx of left sided chest pain that was induced with walking, pain is dull, constant, radiates down left arm, improves with supine position; reports SOB; no pleuritic pain, no cough, no recent immobilization. Did not have a similar Sx in the past. Feels depressed without SI/Hi or mania. Drinks one bottle of liquor weekly.  Depression History:      The patient denies a depressed mood most of the day and a diminished interest in her usual daily activities.         Preventive Screening-Counseling & Management  Alcohol-Tobacco     Alcohol drinks/day: weekends     Alcohol type: liquors     Smoking Status: current     Smoking Cessation Counseling: yes     Packs/Day: 1pk/2 week     Year Started: restarted May 2011  Caffeine-Diet-Exercise     Does Patient Exercise: yes     Type of exercise: walks to work daily  Current Problems (verified): 1)  Depression  (ICD-311) 2)  Chest  Pain  (ICD-786.50) 3)  Hypertension  (ICD-401.9) 4)  Obesity, Morbid  (ICD-278.01) 5)  Common Migraine  (ICD-346.10) 6)  Pelvic Pain  (ICD-625.9) 7)  Menorrhagia  (ICD-626.2) 8)  Ovarian Cyst  (ICD-620.2) 9)  Risk of Sleep Apnea  (ICD-V12.59) 10)  Smoker  (ICD-305.1)  Current Medications (verified): 1)  Hydrochlorothiazide 25 Mg Tabs (Hydrochlorothiazide) .... Take 1 Tablet By Mouth Once A Day 2)  Norvasc 10 Mg Tabs (Amlodipine Besylate) .... Take One Tablet Daily. 3)  Omeprazole 20 Mg Cpdr (Omeprazole) .... Take 1 Tablet By Mouth Once A Day 4)  Coreg 6.25 Mg Tabs (Carvedilol) .... Take 1 Tablet By Mouth Two Times A Day 5)  Imitrex 50 Mg Tabs (Sumatriptan Succinate) .... Take 1 Tablet At The Onset of Headache. 6)  Paroxetine Hcl 20 Mg Tabs (Paroxetine Hcl) .... Take One Tablet By Mouth Daily For Deperssion/anxiety 7)  Naprosyn 500 Mg Tabs (Naproxen) .... Take One Tablet By Mouth Two Times A Day As Needed With Meals For Cp  Allergies (verified): No Known Drug Allergies  Past History:  Past medical, surgical, family and social histories (including risk factors) reviewed for relevance to current acute and chronic problems.  Past Medical History: Reviewed history  from 06/21/2009 and no changes required. Hypertension Ovarian cysts-benign by [place] Preeclampsia Smoking alcohol:  quit 03/2008 (drank 1-2 bottles liquor daily) Migraine headache Trichomonas Bradicardia  Past Surgical History: Reviewed history from 06/21/2009 and no changes required. c section 2003  Family History: Reviewed history and no changes required. DM, Cancer (type?)  Social History: Reviewed history from 04/26/2009 and no changes required. has a 31 year old son (2011) just quit smokingPacks/Day:  1pk/2 week Smoking Status:  current  Physical Exam  General:  obese, alert and cooperative to examination.   Eyes:  vision grossly intact, pupils equal, pupils round, and pupils reactive to light.   anicteric Nose:  no sinus percussion tenderness.   Mouth:  pharynx pink and moist.   Neck:  supple, no masses, no thyromegaly, and no carotid bruits.   Chest Wall:  No chest wall TTP. Lungs:  normal respiratory effort and no accessory muscle use.   Heart:  normal rate, regular rhythm, no murmur, no gallop, and no rub.   Abdomen:  soft, normal bowel sounds, no distention, no guarding, and no rebound tenderness.  Pt reports mild tenderness throughout.  Unable to assess for organomegaly Extremities:  trace edema bilaterally Neurologic:  alert & oriented X3, cranial nerves II-XII intact, and strength normal in all extremities.   Skin:  no suspicious lesions.   Psych:  Oriented X3, memory intact for recent and remote, and normally interactive.  depressed affect, poor eye contact, and slightly anxious.  depressed affect, poor eye contact, and slightly anxious.     Impression & Recommendations:  Problem # 1:  CHEST PAIN (ICD-786.50) Assessment Deteriorated TIMI risk index is 5.3 with a 0.2% 24 mortality rate. 10 year Coronary heart disease risk ([place] [facility] study risk): -5 However, the patient is with multiple risks factors including being AA, morbidly obese, smoker, and ETOH abuse. 10 year Coronary heart disease risk ([place] [facility] study risk): -5 Orders: T-TSH ([id number]) T-Urine Drug Screen (in house) [phone]) 12 Lead EKG (12 Lead EKG) T- * Misc. Laboratory test [phone])  Problem # 2:  DEPRESSION (ICD-311) Assessment: New patient is a single mother of an 31 y/o son->lives in a one-bedroon apprtment with her mother, and a brother; Financial problems. Will start Paxil. side-effects reviewed with the patient. Strongly advised to stop drinking alcohol. Her updated medication list for this problem includes:    Paroxetine Hcl 20 Mg Tabs (Paroxetine hcl) .Marland Kitchen... Take one tablet by mouth daily for deperssion/anxiety  Orders: Social Work Referral (Social )  Discussed treatment options,  including trial of antidpressant medication. Will refer to behavioral health. Follow-up call in in 24-48 hours and recheck in 2 weeks, sooner as needed. Patient agrees to call if any worsening of symptoms or thoughts of doing harm arise. Verified that the patient has no suicidal ideation at this time.   Problem # 3:  HYPERTENSION (ICD-401.9) Patient ran out of her meds. Strongly advised to adhere with a treatment plan. Weight managment discussed. Her updated medication list for this problem includes:    Hydrochlorothiazide 25 Mg Tabs (Hydrochlorothiazide) .Marland Kitchen... Take 1 tablet by mouth once a day    Norvasc 10 Mg Tabs (Amlodipine besylate) .Marland Kitchen... Take one tablet daily.    Coreg 6.25 Mg Tabs (Carvedilol) .Marland Kitchen... Take 1 tablet by mouth two times a day  BP today: 141/95 Prior BP: 143/97 (08/29/2009)  Labs Reviewed: K+: 3.8 (06/21/2009) Creat: : 0.95 (06/21/2009)     Problem # 4:  OBESITY, MORBID (ICD-278.01) Assessment: Unchanged Risks of obesity  discussed with the patient. Diet, exercise discussed. Orders: T-Lipid Profile [phone]) T-Hgb A1C (in-house) ([id number]) T-TSH [phone])  Ht: 66.4 (04/02/2010)   Wt: 281.0 (04/02/2010)   BMI: 44.97 (04/02/2010)  Complete Medication List: 1)  Hydrochlorothiazide 25 Mg Tabs (Hydrochlorothiazide) .... Take 1 tablet by mouth once a day 2)  Norvasc 10 Mg Tabs (Amlodipine besylate) .... Take one tablet daily. 3)  Omeprazole 20 Mg Cpdr (Omeprazole) .... Take 1 tablet by mouth once a day 4)  Coreg 6.25 Mg Tabs (Carvedilol) .... Take 1 tablet by mouth two times a day 5)  Imitrex 50 Mg Tabs (Sumatriptan succinate) .... Take 1 tablet at the onset of headache. 6)  Paroxetine Hcl 20 Mg Tabs (Paroxetine hcl) .... Take one tablet by mouth daily for deperssion/anxiety 7)  Naprosyn 500 Mg Tabs (Naproxen) .... Take one tablet by mouth two times a day as needed with meals for cp  Other Orders: T-Urine Pregnancy (in -house) (46962) T-CK MB Fract  ([id number])  Patient Instructions: 1)  Please, call 911 or go to Ed if chest pain gets worse. 2)  Please, follow up in 1-2 weeks. Prescriptions: NAPROSYN 500 MG TABS (NAPROXEN) Take one tablet by mouth two times a day as needed with meals for CP  #60 x 1   Entered and Authorized by:   Deatra Robinson MD   Signed by:   Deatra Robinson MD on 04/02/2010   Method used:   Electronically to        [facility]  [address] [phone]* (retail)       [address]       [place], [place]  [address]       Ph: [phone]       Fax: [phone]   RxID:   [id number] IMITREX 50 MG TABS (SUMATRIPTAN SUCCINATE) take 1 tablet at the onset of headache.  #21 x 3   Entered and Authorized by:   Deatra Robinson MD   Signed by:   Deatra Robinson MD on 04/02/2010   Method used:   Electronically to        [facility]  [address] [phone]* (retail)       [address]       [place], [place]  [address]       Ph: [phone]       Fax: [phone]   RxID:   [id number] COREG 6.25 MG TABS (CARVEDILOL) Take 1 tablet by mouth two times a day  #90 x 3   Entered and Authorized by:   Deatra Robinson MD   Signed by:   Deatra Robinson MD on 04/02/2010   Method used:   [facility]ronically to        [facility]  [address] [phone]* (retail)       [address]       [place], [place]  [address]       Ph: [phone]       Fax: [phone]   RxID:   [id number] OMEPRAZOLE 20 MG CPDR (OMEPRAZOLE) Take 1 tablet by mouth once a day  #90 x 3   Entered and Authorized by:   Deatra Robinson MD   Signed by:   Deatra Robinson MD on 04/02/2010   Method used:   [facility]ronically to        [facility]  [address] [phone]* (retail)  [address]       [place], [place]  [address]       Ph: [phone]       Fax: [phone]   RxID:   [id number] NORVASC 10 MG TABS (AMLODIPINE BESYLATE) Take one tablet daily.  #90 x 3   Entered and Authorized  by:   Deatra Robinson MD   Signed by:   Deatra Robinson MD on 04/02/2010   Method used:   [facility]ronically to        [facility]  [address] [phone]* (retail)       [address]       [place], [place]  [address]       Ph: [phone]       Fax: [phone]   RxID:   [id number] HYDROCHLOROTHIAZIDE 25 MG TABS (HYDROCHLOROTHIAZIDE) Take 1 tablet by mouth once a day  #90 x 3   Entered and Authorized by:   Deatra Robinson MD   Signed by:   Deatra Robinson MD on 04/02/2010   Method used:   [facility]ronically to        [facility]  [address] [phone]* (retail)       [address]       [place], [place]  [address]       Ph: [phone]       Fax: [phone]   RxID:   [id number] PAROXETINE HCL 20 MG TABS (PAROXETINE HCL) Take one tablet by mouth daily for deperssion/anxiety  #90 x 4   Entered and Authorized by:   Deatra Robinson MD   Signed by:   Deatra Robinson MD on 04/02/2010   Method used:   [facility]ron[facility]        [facility]  [address] [phone]* (retail)       [address]       [place], [place]  [address]       Ph: [phone]       Fax: [phone]   RxID:   [id number]    Orders Added: 1)  Est. Patient Level III [[address]] 2)  T-Urine Pregnancy (in -house) [81025] 3)  T-Lipid Profile [80061-22930] 4)  T-Hgb A1C (in-house) [83036QW] 5)  T-TSH [44315-40086] 6)  12 Lead EKG [12 Lead EKG] 7)  T- * Misc. Laboratory test [phone] 8)  Social Work Referral [Social ] 9)  T-CK MB Fract [phone]    Prevention & Chronic Care Immunizations   Influenza vaccine: Not documented   Influenza vaccine deferral: Refused  (04/02/2010)   Influenza vaccine due: 12/21/2010    Tetanus booster: Not documented   Td booster deferral: Refused  (04/02/2010)   Tetanus booster due: 04/02/2020    Pneumococcal vaccine: Not documented   Pneumococcal vaccine deferral: Not indicated  (04/02/2010)  Other Screening   Pap smear:  NEGATIVE FOR INTRAEPITHELIAL LESIONS OR MALIGNANCY.  (04/26/2009)   Pap smear due: 04/27/2011   Smoking status: current  (04/02/2010)   Smoking cessation counseling: yes  (04/02/2010)  Hypertension   Last Blood Pressure: 141 / 95  (04/02/2010)   Serum creatinine: 0.95  (06/21/2009)   Serum potassium 3.8  (06/21/2009)   Basic metabolic panel due: 06/22/2010    Hypertension flowsheet reviewed?: Yes   Progress toward BP goal: Unchanged    Stage of readiness to change (hypertension management): Action  Self-Management Support :    Patient will work on the following items until the next clinic visit to reach self-care goals:     Medications and monitoring: take my medicines every day, bring all of my medications to every visit  (04/02/2010)     Eating: use fresh or frozen vegetables, eat foods that are low in salt, eat baked foods instead of fried foods  (04/02/2010)     Activity: take a 30 minute walk every day, take the stairs instead of the elevator  (04/02/2010)    Hypertension self-management support: Education handout, Written self-care plan  (04/02/2010)   Hypertension self-care plan printed.   Hypertension education handout printed   Nursing Instructions: Give Flu vaccine today Give tetanus booster today   Process Orders Check Orders Results:     [facility]: ABN not required for this insurance Tests Sent for requisitioning (April 02, 2010 11:45 AM):     04/02/2010: [facility] -- T-Lipid Profile [phone] (signed)     04/02/2010: [facility] -- T-TSH [phone] (signed)     04/02/2010: [facility] -- T- * Misc. Laboratory test [99999] (signed)     04/02/2010: [facility] -- T-CK MB Fract [phone] (signed)     Appended Document: Lab Order Results of A1C and Urine Pregnancy    Lab Visit  Laboratory Results   Urine Tests  Date/Time Received: April 02, 2010 12:29  PM  Date/Time Reported: Burke Keels  April 02, 2010 12:29 PM     Urine HCG: negative   Orders Today:

## 2010-05-23 NOTE — Letter (Signed)
Summary: F/U Letter  [facility]  [address]   [place], [place] [address]   Phone: [phone]  Fax: [phone]    04/08/2010    Jeanette Gamble [address] [place], [place]  [address]  Dear Ms. [facility],   Your physician asked that I contact you.   I was unable to reach you by phone.  Please call me at [phone] so we can discuss possible resources that may be of help to you.   I look forward to hearing from you.     Sincerely,   Dorothe Pea Clinical Social Worker [facility]

## 2010-07-25 LAB — CBC
HCT: 37.5 % (ref 36.0–46.0)
Hemoglobin: 13 g/dL (ref 12.0–15.0)
MCHC: 34.6 g/dL (ref 30.0–36.0)
MCV: 89.5 fL (ref 78.0–100.0)
Platelets: 212 10*3/uL (ref 150–400)
RBC: 4.19 MIL/uL (ref 3.87–5.11)
RDW: 13.1 % (ref 11.5–15.5)
WBC: 8.6 10*3/uL (ref 4.0–10.5)

## 2010-07-25 LAB — DIFFERENTIAL
Basophils Absolute: 0.1 10*3/uL (ref 0.0–0.1)
Basophils Relative: 1 % (ref 0–1)
Eosinophils Absolute: 0.2 10*3/uL (ref 0.0–0.7)
Eosinophils Relative: 2 % (ref 0–5)
Lymphocytes Relative: 23 % (ref 12–46)
Lymphs Abs: 2 10*3/uL (ref 0.7–4.0)
Monocytes Absolute: 0.3 10*3/uL (ref 0.1–1.0)
Monocytes Relative: 4 % (ref 3–12)
Neutro Abs: 6 10*3/uL (ref 1.7–7.7)
Neutrophils Relative %: 70 % (ref 43–77)

## 2010-07-25 LAB — URINALYSIS, ROUTINE W REFLEX MICROSCOPIC
Glucose, UA: NEGATIVE mg/dL
Ketones, ur: 15 mg/dL — AB
Nitrite: NEGATIVE
Protein, ur: NEGATIVE mg/dL
Specific Gravity, Urine: 1.026 (ref 1.005–1.030)
Urobilinogen, UA: 1 mg/dL (ref 0.0–1.0)
pH: 5.5 (ref 5.0–8.0)

## 2010-07-25 LAB — WET PREP, GENITAL
Clue Cells Wet Prep HPF POC: NONE SEEN
Yeast Wet Prep HPF POC: NONE SEEN

## 2010-07-25 LAB — COMPREHENSIVE METABOLIC PANEL
ALT: 21 U/L (ref 0–35)
AST: 20 U/L (ref 0–37)
Albumin: 3.9 g/dL (ref 3.5–5.2)
Alkaline Phosphatase: 73 U/L (ref 39–117)
BUN: 14 mg/dL (ref 6–23)
CO2: 24 mEq/L (ref 19–32)
Calcium: 9 mg/dL (ref 8.4–10.5)
Chloride: 109 mEq/L (ref 96–112)
Creatinine, Ser: 1.1 mg/dL (ref 0.4–1.2)
GFR calc Af Amer: >60 mL/min (ref >60)
GFR calc non Af Amer: 59 mL/min — ABNORMAL LOW (ref >60)
Glucose, Bld: 82 mg/dL (ref 70–99)
Potassium: 3.5 mEq/L (ref 3.5–5.1)
Sodium: 139 mEq/L (ref 135–145)
Total Bilirubin: 1 mg/dL (ref 0.3–1.2)
Total Protein: 7.3 g/dL (ref 6.0–8.3)

## 2010-07-25 LAB — PROTIME-INR
INR: 1.04 (ref 0.00–1.49)
Prothrombin Time: 13.5 seconds (ref 11.6–15.2)

## 2010-07-25 LAB — LIPASE, BLOOD: Lipase: 18 U/L (ref 11–59)

## 2010-07-25 LAB — GC/CHLAMYDIA PROBE AMP, GENITAL
Chlamydia, DNA Probe: NEGATIVE
GC Probe Amp, Genital: NEGATIVE

## 2010-07-25 LAB — PREGNANCY, URINE: Preg Test, Ur: NEGATIVE

## 2010-07-25 LAB — POCT CARDIAC MARKERS
CKMB, poc: 1.2 ng/mL (ref 1.0–8.0)
Myoglobin, poc: 80.3 ng/mL (ref 12–200)
Troponin i, poc: <0.05 ng/mL (ref 0.00–0.09)

## 2010-07-25 LAB — URINE MICROSCOPIC-ADD ON

## 2010-07-25 LAB — APTT: aPTT: 27 seconds (ref 24–37)

## 2010-07-29 LAB — URINALYSIS, ROUTINE W REFLEX MICROSCOPIC
Bilirubin Urine: NEGATIVE
Glucose, UA: NEGATIVE mg/dL
Ketones, ur: NEGATIVE mg/dL
Nitrite: NEGATIVE
Protein, ur: 30 mg/dL — AB
Specific Gravity, Urine: 1.031 — ABNORMAL HIGH (ref 1.005–1.030)
Urobilinogen, UA: 1 mg/dL (ref 0.0–1.0)
pH: 6 (ref 5.0–8.0)

## 2010-07-29 LAB — POCT I-STAT, CHEM 8
BUN: 19 mg/dL (ref 6–23)
Calcium, Ion: 1.12 mmol/L (ref 1.12–1.32)
Chloride: 107 mEq/L (ref 96–112)
Creatinine, Ser: 1.1 mg/dL (ref 0.4–1.2)
Glucose, Bld: 87 mg/dL (ref 70–99)
HCT: 35 % — ABNORMAL LOW (ref 36.0–46.0)
Hemoglobin: 11.9 g/dL — ABNORMAL LOW (ref 12.0–15.0)
Potassium: 3.7 mEq/L (ref 3.5–5.1)
Sodium: 139 mEq/L (ref 135–145)
TCO2: 21 mmol/L (ref 0–100)

## 2010-07-29 LAB — URINE MICROSCOPIC-ADD ON

## 2010-07-29 LAB — POCT PREGNANCY, URINE: Preg Test, Ur: NEGATIVE

## 2010-09-06 NOTE — H&P (Signed)
   NAME:  Jeanette Gamble, Jeanette Gamble                        ACCOUNT NO.:  [id number]   MEDICAL RECORD NO.:  [id number]                   PATIENT TYPE:  INP   LOCATION:  9181                                 FACILITY:  [facility]   PHYSICIAN:  Kathreen Cosier, M.D.           DATE OF BIRTH:  1981-01-17   DATE OF ADMISSION:  02/20/2002  DATE OF DISCHARGE:                                HISTORY & PHYSICAL   HISTORY OF PRESENT ILLNESS:  The patient is a 30 year old primigravid seen  once in the office on February 08, 2002.  Blood pressure 140/80.  She had a  history of hypertension not on any medication.  The patient was sent for  laboratories that were not done.  The patient called me on February 19, 2002  1 p.m. and stated that she had blurring of the vision and her face, hands,  and feet were swollen.  She was told to come to [facility] at that  time.  The patient showed up at [facility] approximately 24 hours  later on February 20, 2002.  At that time her blood pressure was 205/148 and  then 191/148.  Urine protein greater than 300.  Uric acid 11.4.  LDH 356.  Cervix was long and closed.  She was started on magnesium sulfate 4 g  loading, 2 g/hour.  After her magnesium loading dose blood pressure 177/125.  It was decided that she would be delivered by a cesarean section because of  severe preeclampsia.  She denied any epigastric pain or headache.   PHYSICAL EXAMINATION:  GENERAL:  Obese female in no distress.  HEENT:  Negative.  Her face was edematous.  LUNGS:  Clear.  HEART:  Regular rhythm.  No murmurs or gallops.  ABDOMEN:  30 weeks size.  Fetal heart 140.  PELVIC:  Cervix long, closed.  EXTREMITIES:  Hands were edematous.  She had 2+ edema.  Reflexes were 3+.                                               Kathreen Cosier, M.D.    BAM/MEDQ  D:  02/20/2002  T:  02/21/2002  Job:  [id number]

## 2010-09-06 NOTE — Discharge Summary (Signed)
NAME:  Jeanette Gamble, Jeanette Gamble                        ACCOUNT NO.:  [id number]   MEDICAL RECORD NO.:  [id number]                   PATIENT TYPE:  INP   LOCATION:  9178                                 [facility]ACILITY:  [facility]   PHYSICIAN:  Kathreen Cosier, M.D.           DATE OF BIRTH:  Feb 23, 1981   DATE OF ADMISSION:  02/20/2002  DATE OF DISCHARGE:  02/25/2002                                 DISCHARGE SUMMARY   HISTORY OF PRESENT ILLNESS:  The patient is a 30 year old primigravida seen  once in my office for prenatal care on February 08, 2002.  She has a history  of high blood pressure but had not been on any medications and when she was  seen her blood pressure was 140/80.  The patient called me on February 19, 2002 stating that she had blurring of her vision and that her face was  swollen.  She said she had gone to the drugstore a couple of days before and  her blood pressures was abnormal but she did not know what they were.  She  was advised to come to the [facility] at that time.   HOSPITAL COURSE:  The patient showed up at [facility] at noon on  November 2nd, 24 hours later, with marked edematous facies, hands, legs.  Blood pressure was 191-204/148.  A CBC_____, labs were done, urine protein  done.  Her fetal heart was 130.  Urine protein was greater than 3+, uric  acid was 11.4.  The patient received IV labetalol and magnesium sulfate and  blood pressures stayed in 170/125 range.  The cervix was long and closed,  presenting part floating.  Her platelet count was 105.  It was decided she  would have C-section for severe preeclampsia.  She had an epidural and she  delivered a female, Apgars 5 and 8, weighing 2 pounds 5 ounces with a cord pH  of 7.22.  The placenta was small and sent to pathology.  Postoperatively,  she was kept on magnesium sulfate and labetalol until day #2, and she  diuresed well and did well.  On the third postop day, blood pressure went  back up to 172/128,  150/120.  She was asymptomatic throughout all of her  hypertensive episodes.  She was given labetalol and restarted on magnesium  sulfate and transferred to the ICU.  By November 6th, her uric acid was down  to 9.9, blood pressures 140/80-90, magnesium sulfate was discontinued, and  her hydrochlorothiazide was increased from 25 to 50 mg q.d.  By the morning  of postoperative day #5, blood pressure was 140/76.   DISCHARGE MEDICATIONS:  She was discharged home on hydrochlorothiazide 50 mg  p.o. q.d. and labetalol 150 mg p.o. b.i.d.   FOLLOW UP:  To see me on Wednesday for blood pressure checks.   DISCHARGE DIAGNOSIS:  Primary low transverse cesarean section at 30 weeks of  gestation because of severe  preeclampsia.                                               Kathreen Cosier, M.D.    BAM/MEDQ  D:  02/25/2002  T:  02/27/2002  Job:  [id number]

## 2010-09-06 NOTE — Op Note (Signed)
   NAME:  ZSOFIA, PROUT                        ACCOUNT NO.:  [id number]   MEDICAL RECORD NO.:  [id number]                   PATIENT TYPE:  INP   LOCATION:  9181                                 [facility]ACILITY:  [facility]   PHYSICIAN:  Kathreen Cosier, M.D.           DATE OF BIRTH:  25-Dec-1980   DATE OF PROCEDURE:  02/20/2002  DATE OF DISCHARGE:                                 OPERATIVE REPORT   PREOPERATIVE DIAGNOSIS:  Severe preeclampsia.   POSTOPERATIVE DIAGNOSIS:  Severe preeclampsia.   PROCEDURE:   SURGEON:  Kathreen Cosier, M.D.   ASSISTANT:  Maxie Better, M.D.   ANESTHESIA:  Epidural.   DESCRIPTION OF PROCEDURE:  The patient was on the operating room table in  the supine position.  After adequate anesthesia, the abdomen was prepped and  draped.  Bladder emptied with a Foley catheter.  A transverse suprapubic  incision was made and carried down to the rectus fascia.  The fascia was  cleaned and incised the length of the incision.  The recti muscles were  retracted laterally.  Peritoneal incised longitudinally.  A transverse  incision was made in the visceral peritoneum above the bladder and bladder  mobilized inferiorly.  A transverse lower uterine incision was made and the  patient was delivered from the OP position of a female, Apgars 5 and 8,  weighing 2 pounds 5 ounces.  Cord pH was 7.22.  The placenta was then clear  and small, removed manually and sent to pathology.  The uterine cavity was  cleaned with dry laps.  Uterine incision closed in one layer with continuous  suture of #1 chromic.  Bladder flap was reattached with 2-0 chromic.  Uterus  well contracted.  Tubes and ovaries normal.  Abdomen closed in layers,  peritoneum continuous 2-0 chromic, fascia continuous 6-0 Dexon.  Skin closed  with subcuticular stitch of 3-0 plain.  The patient tolerated the procedure  well and taken to the recovery room in good condition.        Kathreen Cosier, M.D.    BAM/MEDQ  D:  02/20/2002  T:  02/20/2002  Job:  [id number]

## 2010-09-06 NOTE — Discharge Summary (Signed)
NAME:  Jeanette Gamble, Jeanette Gamble                        ACCOUNT NO.:  [id number]   MEDICAL RECORD NO.:  [id number]                   PATIENT TYPE:  INP   LOCATION:  9157                                 FACILITY:  [facility]   PHYSICIAN:  Roseanna Rainbow, M.D.         DATE OF BIRTH:  Dec 02, 1980   DATE OF ADMISSION:  02/02/2002  DATE OF DISCHARGE:  02/04/2002                                 DISCHARGE SUMMARY   CHIEF COMPLAINT:  The patient is a 30 year old gravida 1, para 0 with an  intrauterine pregnancy at 27 weeks 5 days, estimated date of confinement  April 29, 2002 with mild preeclampsia.   HISTORY OF PRESENT ILLNESS:  The patient complains of a frontal headache.  She denied any other complaints.  The patient had a 2-pound weight gain in 2  days.   LABORATORY DATA:  Labs on October 14th were remarkable for a creatinine of  1.0, and uric acid of 6.5.   ALLERGIES:  No known drug allergies.   MEDICATIONS:  Prenatal vitamins.   PAST OBSTETRICAL/GYNECOLOGICAL HISTORY:  She has a history of Chlamydia and  trichomoniasis.   PAST MEDICAL HISTORY:  History of migraine headaches.   SOCIAL HISTORY:  History of tobacco use.   FAMILY HISTORY:  Remarkable for hypertension, myocardial infarction, and  diabetes mellitus.   PHYSICAL EXAMINATION:  VITAL SIGNS:  Temperature 97.7, pulse 80, respiratory  rate 20, blood pressure 146/118, weight 278-1/2 pounds, height 5 foot 8  inches.  GENERAL APPEARANCE:  An African-American female in no apparent distress.  HEENT:  Normocephalic.  LUNGS:  Clear to auscultation bilaterally.  HEART:  Regular rate and rhythm.  ABDOMEN:  Gravid, nontender.  PELVIC:  Deferred.  EXTREMITIES:  Trace to 1+ pretibial edema.  Patellar, deep tendon reflexes  were 3+ bilaterally.   IMPRESSION:  Primigravida at 27+ weeks with mild preeclampsia.   PLAN:  Admission, possible steroids.  Repeat ultrasonogram, laboratory work,  and a one-hour glucose tolerance test, and will  check the 24-hour urine  protein and creatinine results.   HOSPITAL COURSE:  The patient was admitted.  She complained of a headache on  hospital day #1, a 9 out of 10 intensity, without any neurological  complaints.  Blood pressure ranged from 130-150/80-103.  She had a one-pound  weight gain on hospital day #1.  The results from her 24-hour urine:  Her  serum creatinine was 1.2, creatinine clearance of 92, total protein was 96  with a volume of 1925 mL in 24 hours.  At this point, she was started on  Aldomet 250 mg p.o. q.6 h.  On hospital day #2, her blood pressure was noted  to be 155/102 and this was after pseudoephedrine for upper respiratory  infection-type symptoms.  This was subsequently discontinued.  However, she  had now gained a total of four pounds since admission.  She had good urine  output, the fetal heart tracing was reassuring.  At this point, the  patient's mother was advising that the patient be discharged home, also at  the advice of a family clergyman.  The patient was not expressing any  decision making.  The patient and the mother were counseled regarding the  risks to the pregnancy including maternal seizure, possible placental  abruption if the patient should leave the hospital, and she was counseled  that if she left the hospital she would need to sign leave against medical  advice paperwork.  It was also recommended at this point that steroids be  administered to stimulate fetal lung maturity.  A social work case Production designer, theatre/television/film  became involved with her care as well as the hospital chaplain, and these  discussions with the patient and the patient's family did not dissuade the  patient from leaving against medical advice, and she at this point left  against medical advice.                                               Roseanna Rainbow, M.D.    Jeanette Gamble  D:  03/21/2002  T:  03/22/2002  Job:  [id number]

## 2010-09-06 NOTE — Discharge Summary (Signed)
NAMEJIMIA, GENTLES NO.:  [id number]   MEDICAL RECORD NO.:  [id number]          PATIENT TYPE:  INP   LOCATION:  4709                         FACILITY:  [facility]   PHYSICIAN:  Ramiro Harvest, MD    DATE OF BIRTH:  Jul 24, 1980   DATE OF ADMISSION:  08/07/2004  DATE OF DISCHARGE:  08/08/2004                           DISCHARGE SUMMARY - REFERRING   DISCHARGE DIAGNOSES:  1.  Bradycardia, question of etiology.  2.  Urinary tract infection.  3.  Trichomonas.  4.  Hypertension.  5.  Headaches.   DISCHARGE MEDICATIONS:  1.  Hydrochlorothiazide 25 mg p.o. daily.  2.  Sumatriptan 50 mg p.r.n.   CONSULTATIONS:  None.   FOLLOW UP:  1.  Patient is to follow up with Dr. Donald Pore at the [facility] on Sep 11, 2004, at 11 a.m. at which point in time on      follow-up patient probably needs a pelvic examination as patient was      positive for Trichomonas.  Also, follow-up needs to be done on the      patient's sleep study which will be set up.  2.  Patient will be set up for a sleep study and will be informed about time      and place.   PROCEDURES:  1.  Head CT with and without contrast was done which was a negative CT brain      scan.  2.  Echocardiogram  was done on August 08, 2004, which showed overall left      ventricular systolic function was normal, ejection fraction ranging      between 55 to 65%, no left ventricular regional wall motion      abnormalities, left ventricular diastolic function parameters were      normal.  There was trial mitral valve regurgitation, mild pulmonary      regurgitation and mild tricuspid valvular regurgitation.  3.  EKG done showed a marked sinus bradycardia.  This was done on August 08, 2004.  4.  EKG done on August 07, 2004, showed marked sinus bradycardia with sinus      arrhythmia.   HISTORY OF PRESENT ILLNESS:  [name] is a 30 year old African American  female with past medical history  significant for hypertension, migraines and  tobacco abuse who presented to continuity clinic, visit with symptoms of  fainting spells x1 week prior to admission.  Patient had stated that she had  started a new job nine days prior to admission that entailed prolonged  standing.  Two days after starting her job which was seven days prior to  admission, patient stated that she had two to three fainting spells daily  without any loss of consciousness.  Patient also complained of some  substernal chest pain that she described as a 10/10 intensity that she  states starts abruptly and lasts about 20 minutes and resolves after the  patient lays down.  Chest pain was associated with some palpitations.  Patient also complained of an associated headache that are unlike  her  migraine headaches that she states has a shooting sensation to her eyes.   ALLERGIES:  No known drug allergies.   PHYSICAL EXAMINATION:  GENERAL APPEARANCE:  Patient is well-nourished, well-  developed black female in no apparent distress.  VITAL SIGNS:  Temperature 98.1, pulse 46, blood pressure 136/84, respiratory  rate 20, O2 saturation 100% on room air.  HEENT:  Normocephalic and atraumatic.  Pupils are equal, round and reactive  to light.  Extraocular movements intact.  Oropharynx was clear.  No  erythema, no lesions.  No exudates.  NECK:  Supple with no JVD and no bruits.  LUNGS:  Clear to auscultation bilaterally with moderate air movement.  CARDIOVASCULAR:  Bradycardic, regular rate, no murmurs, rubs, or gallops.  ABDOMEN:  Obese, soft, nontender and nondistended with positive bowel  sounds.  EXTREMITIES:  There was 1+ bilateral pitting edema with no clubbing or  cyanosis, 2+ dorsalis pedis.  SKIN:  Intact.  NEUROLOGIC:  Patient was alert and oriented x3.  Cranial nerves II-XII  grossly intact.  Muscular strength was 5/5 in both upper and lower  extremities bilaterally.  Patient has 2+ reflexes.  The patient had a  normal  gait, Romberg was negative and negative cerebellar signs.  PSYCHIATRIC:  Patient had appropriate affect,was pleasant, no suicidal  ideations or homicidal ideations.   ADMISSION LABORATORY DATA:  Sodium 139, potassium 3.3, chloride 108, bicarb  25, BUN 7, creatinine 1.0, glucose 102, total bilirubin 0.5, alkaline  phosphatase 75, AST 22, ALT 23, total protein 6.5, albumin 3.5, calcium 9.0.  ABGs had pH of 7.47, pCO2 of 29, pO2 of 142, bicarb of 21, O2 saturation  99%.  BNP was less than 30.  D-dimer was 0.32.  Urine pregnancy test was  negative.  Urinalysis:  Urine color was yellow, clear in appearance;  specific gravity 1.013, pH 5.5, urine glucose negative, bilirubin negative,  ketones negative, small blood, negative protein, 0.2 urobilinogens, negative  nitrites and trace leukocytes.  Microscopic had few squamous epithelial  cells, 7 to 10 white blood cells, 0 to 2 red blood cells, few bacteria and  some rare Trichomonas.  Magnesium level was 1.8.  TSH was 0.646.   HOSPITAL COURSE:  PROBLEM #1 -  BRADYCARDIA:  Etiology unknown.  Question of  possibly secondary to obstructive sleep apnea.  During the hospitalization,  the patient had a heart rate increased in the 100s through the night and  some low heart rates below 60.  Most of patient's heart rates were well  above 60.  Patient had no episodes of presyncope nor fainting cells during  the hospitalization.  EKG was done which showed only a sinus arrhythmia.  Patient was on telemetry overnight and was pretty much stable.  A 2-D echo  was obtained on the patient to rule out any forms of cardiomyopathy.  A 2-D  echo was within normal limits.  TSH was within normal limits as well as  urine drug screen was negative.  Patient was not on  any prescription  medications, no over-the-counter medications which would have lead to  decrease  in heart rate.  The patient's bradycardia might possibly be normal for this patient versus a  relation to obstructive sleep apnea.  On  discharge, patient will be set up for a sleep study at the [facility] to evaluate for obstructive sleep apnea.  The patient was discharged  in stable condition.   PROBLEM #2 -  HEADACHES:  CT of the  head with and without contrast was done.  It was a negative CT which ruled out any lesions on the brain.  There was no  hemorrhage, no ischemia of brain.  during hospitalization, the patient did  not have any episodes of headaches and was pretty much stable during the  hospitalization.   PROBLEM #3 -  HYPERTENSION:  The patient's blood pressure was stable during  the hospitalization.  Patient was maintained on home doses of  hydrochlorothiazide as well as Lisinopril.  Patient will be discharged home  on regular home doses of hydrochlorothiazide and Lisinopril.   PROBLEM #4 -  QUESTION OF OBSTRUCTIVE SLEEP APNEA:  During history and  physical, patient stated that she did have some symptoms of obstructive  sleep apnea including fatigue, excessive snoring at night, use of pillows  and patient will be set up for evaluation for obstructive sleep apnea at  [facility] on discharge.   PROBLEM #5 -  URINARY TRACT INFECTION:  Patient had asymptomatic acute  uncomplicated UTI which was noted on urinalysis and microscopy.  Patient was  thus started on ciprofloxacin.  Ciprofloxacin was then changed to Bactrim.  Patient will be discharged on Bactrim double strength one tablet p.o. b.i.d.  for total treatment course of three days.   PROBLEM #6 -  TRICHOMONAS:  It was noted on urinalysis that patient had some  rare Trichomonas.  Patient has a history of recurrent STDs including  Trichomonas and chlamydia.  At this point in time, the patient will be  treated with Flagyl 500 mg p.o. b.i.d. x7 days.  On follow-up, patient will  need a pelvic examination as well as STD screen.  As she is positive for  Trichomonas, the patient will be told on  discharge to have her sexual  partner go and get treated for Trichomonas.   DISCHARGE LABORATORY DATA:  CBC 91, hemoglobin 11.2, hematocrit 32.2,  platelet count 225.  BMET was sodium 141, potassium 3.8, chloride 110,  bicarb 24, BUN 11, creatinine 1.0, glucose 106, calcium 8.6.   It has been a pleasure taking care of Ms. Esmeralda Arthur.      DT/MEDQ  D:  08/08/2004  T:  08/08/2004  Job:  [id number]

## 2010-09-19 ENCOUNTER — Encounter: Payer: Self-pay | Admitting: Internal Medicine

## 2010-09-19 DIAGNOSIS — R001 Bradycardia, unspecified: Secondary | ICD-10-CM | POA: Insufficient documentation

## 2010-09-19 DIAGNOSIS — F1011 Alcohol abuse, in remission: Secondary | ICD-10-CM | POA: Insufficient documentation

## 2010-09-19 DIAGNOSIS — A599 Trichomoniasis, unspecified: Secondary | ICD-10-CM | POA: Insufficient documentation

## 2010-12-02 ENCOUNTER — Encounter: Payer: Self-pay | Admitting: Internal Medicine

## 2011-01-06 ENCOUNTER — Encounter: Payer: Self-pay | Admitting: Internal Medicine

## 2011-01-21 ENCOUNTER — Encounter: Payer: Self-pay | Admitting: Internal Medicine

## 2011-02-04 ENCOUNTER — Encounter: Payer: Self-pay | Admitting: Internal Medicine

## 2011-02-04 ENCOUNTER — Ambulatory Visit (INDEPENDENT_AMBULATORY_CARE_PROVIDER_SITE_OTHER): Payer: Medicaid Other | Admitting: Internal Medicine

## 2011-02-04 DIAGNOSIS — F17200 Nicotine dependence, unspecified, uncomplicated: Secondary | ICD-10-CM

## 2011-02-04 DIAGNOSIS — N920 Excessive and frequent menstruation with regular cycle: Secondary | ICD-10-CM

## 2011-02-04 DIAGNOSIS — I10 Essential (primary) hypertension: Secondary | ICD-10-CM

## 2011-02-04 DIAGNOSIS — F329 Major depressive disorder, single episode, unspecified: Secondary | ICD-10-CM

## 2011-02-04 DIAGNOSIS — R51 Headache: Secondary | ICD-10-CM

## 2011-02-04 DIAGNOSIS — R519 Headache, unspecified: Secondary | ICD-10-CM | POA: Insufficient documentation

## 2011-02-04 DIAGNOSIS — F1011 Alcohol abuse, in remission: Secondary | ICD-10-CM

## 2011-02-04 MED ORDER — CYCLOBENZAPRINE HCL 10 MG PO TABS: 10.0000 mg | ORAL_TABLET | Freq: Three times a day (TID) | ORAL | Status: DC | PRN

## 2011-02-04 MED ORDER — CARVEDILOL 6.25 MG PO TABS: 6.2500 mg | ORAL_TABLET | Freq: Two times a day (BID) | ORAL | Status: DC

## 2011-02-04 MED ORDER — ONDANSETRON HCL 4 MG PO TABS: 4.0000 mg | ORAL_TABLET | Freq: Four times a day (QID) | ORAL | Status: AC | PRN

## 2011-02-04 NOTE — Assessment & Plan Note (Signed)
Is not currently on any medication. Patient was prescribed Paxil in 03/2011 but never filled it due to financial situation.

## 2011-02-04 NOTE — Progress Notes (Deleted)
  Subjective:    Patient ID: Jeanette Gamble, female    DOB: 06/13/1980, 30 y.o.   MRN: [id number]  HPI    Review of Systems     Objective:   Physical Exam        Assessment & Plan:   

## 2011-02-04 NOTE — Assessment & Plan Note (Signed)
Noted that she has cut down a lot.
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
## 2011-02-04 NOTE — Patient Instructions (Addendum)
Please check your blood pressure on a regular basis.Bring in a log book and blood pressure cuff at the next office visit.

## 2011-02-04 NOTE — Progress Notes (Deleted)
Subjective:   Patient ID: Jeanette Gamble female   DOB: [birth year] 30 y.o.   MRN: [id number]  HPI: [name] is a 30 y.o.     Past Medical History  Diagnosis Date  . Hypertension   . Other and unspecified ovarian cysts     benign by [place]  . Preeclampsia   . Smoking   . H/O alcohol abuse     quit 03/2008 (drank 1-2 bottles of liquor daily)  . Migraine headache   . Trichomonas   . Bradycardia    Current Outpatient Prescriptions  Medication Sig Dispense Refill  . amLODipine (NORVASC) 10 MG tablet Take 10 mg by mouth daily.        . carvedilol (COREG) 6.25 MG tablet Take 6.25 mg by mouth 2 (two) times daily.        . hydrochlorothiazide 25 MG tablet Take 25 mg by mouth daily.        Marland Kitchen omeprazole (PRILOSEC) 20 MG capsule Take 20 mg by mouth daily.        Marland Kitchen PARoxetine (PAXIL) 20 MG tablet Take 20 mg by mouth. Take one tablet by mouth daily for depression/anxiety       . SUMAtriptan (IMITREX) 50 MG tablet Take 50 mg by mouth. Take 1 tablet at the onset of headache        No family history on file. History   Social History  . Marital Status: Single    Spouse Name: N/A    Number of Children: N/A  . Years of Education: N/A   Social History Main Topics  . Smoking status: Current Everyday Smoker  . Smokeless tobacco: Not on file  . Alcohol Use: Not on file  . Drug Use: Not on file  . Sexually Active: Not on file   Other Topics Concern  . Not on file   Social History Narrative   Has a 31 year old son (2011)Just quit smokingFamily history of DM, Cancer (type?)   Review of Systems: Constitutional: Denies fever, chills, diaphoresis, appetite change and fatigue.  HEENT: Denies photophobia, eye pain, redness, hearing loss, ear pain, congestion, sore throat, rhinorrhea, sneezing, mouth sores, trouble swallowing, neck pain, neck stiffness and tinnitus.   Respiratory: Denies SOB, DOE, cough, chest tightness,  and wheezing.   Cardiovascular: Denies chest pain, palpitations  and leg swelling.  Gastrointestinal: Denies nausea, vomiting, abdominal pain, diarrhea, constipation, blood in stool and abdominal distention.  Genitourinary: Denies dysuria, urgency, frequency, hematuria, flank pain and difficulty urinating.  Musculoskeletal: Denies myalgias, back pain, joint swelling, arthralgias and gait problem.  Skin: Denies pallor, rash and wound.  Neurological: Denies dizziness, seizures, syncope, weakness, light-headedness, numbness and headaches.  Hematological: Denies adenopathy. Easy bruising, personal or family bleeding history  Psychiatric/Behavioral: Denies suicidal ideation, mood changes, confusion, nervousness, sleep disturbance and agitation  Objective:  Physical Exam: Filed Vitals:   02/04/11 1633  BP: 130/93  Pulse: 77  Temp: 97.6 F (36.4 C)  TempSrc: Oral  Height: 5\' 6"  (1.676 m)  Weight: 269 lb 9.6 oz (122.29 kg)   Constitutional: Vital signs reviewed.  Patient is a well-developed and well-nourished *** in no acute distress and cooperative with exam. Alert and oriented x3.  Head: Normocephalic and atraumatic Ear: TM normal bilaterally Mouth: no erythema or exudates, MMM Eyes: PERRL, EOMI, conjunctivae normal, No scleral icterus.  Neck: Supple, Trachea midline normal ROM, No JVD, mass, thyromegaly, or carotid bruit present.  Cardiovascular: RRR, S1 normal, S2 normal, no MRG, pulses symmetric and  intact bilaterally Pulmonary/Chest: CTAB, no wheezes, rales, or rhonchi Abdominal: Soft. Non-tender, non-distended, bowel sounds are normal, no masses, organomegaly, or guarding present.  GU: no CVA tenderness Musculoskeletal: No joint deformities, erythema, or stiffness, ROM full and no nontender Hematology: no cervical, inginal, or axillary adenopathy.  Neurological: A&O x3, Strenght is normal and symmetric bilaterally, cranial nerve II-XII are grossly intact, no focal motor deficit, sensory intact to light touch bilaterally.  Skin: Warm, dry and  intact. No rash, cyanosis, or clubbing.  Psychiatric: Normal mood and affect. speech and behavior is normal. Judgment and thought content normal. Cognition and memory are normal.   Assessment & Plan:

## 2011-02-04 NOTE — Assessment & Plan Note (Signed)
Patient noted that she had quit but restarted since she was in a lot of stress. Counseled and discussed about the risk of smoking. Patient noted understanding and will think about getting help.

## 2011-02-04 NOTE — Progress Notes (Deleted)
  Subjective:    Patient ID: Jeanette Gamble, female    DOB: 02-08-1981, 30 y.o.   MRN: [id number]  HPI    Review of Systems     Objective:   Physical Exam        Assessment & Plan:

## 2011-02-04 NOTE — Assessment & Plan Note (Signed)
Unlikely at this point Migraine headache although patient has history of migraine and was prescribed sumatriptan which she never filled. This is likely a combination of tension headache and uncontrolled blood pressure in setting of significant stress. Will start patient on flexeril and recommended to use heat pats on her neck and discussed about coping management of stress. Furthermore will try to control her blood pressure. With a history of chronic blurry vision I am wondering if patient need glasses since that can cause chronic headache too.  Will further provide zofran for nausea and recommended tylenol prn for pain. No NSAID at this point since can cause elevation of BP. BP Readings from Last 3 Encounters:  02/04/11 130/93  04/02/10 141/95  08/29/09 143/97

## 2011-02-04 NOTE — Assessment & Plan Note (Addendum)
Blood pressure uncontrolled. This may be causing her to have headache, occasional chest discomfort and palpitation.  It seems she has episodes of normal and high blood pressure. Will restart Coreg at 6.25 mg BID as she was suppose to take per Dr Onalee Hua note in 03/2011.  Advised to check blood pressure on a regular basis and bring in a log book and her blood pressure cuff to the next visit. If patient continues to have elevated blood pressure inspite of multiple medication we may have to consider secondary Hypertension since she is just 30 years old. Patient need to be refered to Opthalmology since she was noted to have changes in the past but has not been evaluated since more then 3 years ago.

## 2011-02-10 ENCOUNTER — Telehealth: Payer: Self-pay | Admitting: *Deleted

## 2011-02-10 ENCOUNTER — Other Ambulatory Visit: Payer: Medicaid Other

## 2011-02-10 NOTE — Telephone Encounter (Signed)
CALLED PATIENT AND LEFT A VOICE MESSAGE FOR Jeanette Gamble / EYE EXAM APPT: October 30.012 @ 9:20AM [facility]-  [phone]   [address]  [name] NTII   10-22-012  4:10PM

## 2011-02-14 ENCOUNTER — Other Ambulatory Visit: Payer: Medicaid Other

## 2011-02-14 LAB — COMPREHENSIVE METABOLIC PANEL
ALT: 22 U/L (ref 0–35)
AST: 22 U/L (ref 0–37)
Albumin: 3.9 g/dL (ref 3.5–5.2)
Alkaline Phosphatase: 67 U/L (ref 39–117)
BUN: 15 mg/dL (ref 6–23)
CO2: 24 mEq/L (ref 19–32)
Calcium: 8.8 mg/dL (ref 8.4–10.5)
Chloride: 107 mEq/L (ref 96–112)
Creat: 0.96 mg/dL (ref 0.50–1.10)
Glucose, Bld: 80 mg/dL (ref 70–99)
Potassium: 4.3 mEq/L (ref 3.5–5.3)
Sodium: 139 mEq/L (ref 135–145)
Total Bilirubin: 0.3 mg/dL (ref 0.3–1.2)
Total Protein: 6.7 g/dL (ref 6.0–8.3)

## 2011-02-14 LAB — CBC
HCT: 36 % (ref 36.0–46.0)
Hemoglobin: 12.3 g/dL (ref 12.0–15.0)
MCH: 30.3 pg (ref 26.0–34.0)
MCHC: 34.2 g/dL (ref 30.0–36.0)
MCV: 88.7 fL (ref 78.0–100.0)
Platelets: 231 10*3/uL (ref 150–400)
RBC: 4.06 MIL/uL (ref 3.87–5.11)
RDW: 13.3 % (ref 11.5–15.5)
WBC: 7.4 10*3/uL (ref 4.0–10.5)

## 2011-02-14 LAB — LIPID PANEL
Cholesterol: 203 mg/dL — ABNORMAL HIGH (ref 0–200)
HDL: 51 mg/dL (ref >39)
LDL Cholesterol: 142 mg/dL — ABNORMAL HIGH (ref 0–99)
Total CHOL/HDL Ratio: 4 Ratio
Triglycerides: 51 mg/dL (ref <150)
VLDL: 10 mg/dL (ref 0–40)

## 2011-02-14 LAB — POCT GLYCOSYLATED HEMOGLOBIN (HGB A1C): Hemoglobin A1C: 5.1

## 2011-02-17 ENCOUNTER — Ambulatory Visit: Payer: Medicaid Other | Admitting: Internal Medicine

## 2011-02-19 ENCOUNTER — Encounter: Payer: Medicaid Other | Admitting: Internal Medicine

## 2011-02-26 NOTE — Progress Notes (Signed)
Addended by: Neomia Dear on: 02/26/2011 06:34 PM   Modules accepted: Orders

## 2011-03-14 ENCOUNTER — Encounter: Payer: Self-pay | Admitting: Internal Medicine

## 2011-03-14 DIAGNOSIS — H538 Other visual disturbances: Secondary | ICD-10-CM | POA: Insufficient documentation

## 2011-03-24 ENCOUNTER — Encounter: Payer: Self-pay | Admitting: Internal Medicine

## 2011-03-24 ENCOUNTER — Ambulatory Visit (INDEPENDENT_AMBULATORY_CARE_PROVIDER_SITE_OTHER): Payer: Medicaid Other | Admitting: Internal Medicine

## 2011-03-24 DIAGNOSIS — K219 Gastro-esophageal reflux disease without esophagitis: Secondary | ICD-10-CM | POA: Insufficient documentation

## 2011-03-24 DIAGNOSIS — R51 Headache: Secondary | ICD-10-CM

## 2011-03-24 DIAGNOSIS — I10 Essential (primary) hypertension: Secondary | ICD-10-CM

## 2011-03-24 DIAGNOSIS — F17200 Nicotine dependence, unspecified, uncomplicated: Secondary | ICD-10-CM

## 2011-03-24 DIAGNOSIS — H538 Other visual disturbances: Secondary | ICD-10-CM

## 2011-03-24 MED ORDER — OMEPRAZOLE 20 MG PO TBEC: 20.0000 mg | DELAYED_RELEASE_TABLET | Freq: Every day | ORAL | Status: DC
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
## 2011-03-25 LAB — BASIC METABOLIC PANEL
BUN: 17 mg/dL (ref 6–23)
CO2: 24 mEq/L (ref 19–32)
Calcium: 9.6 mg/dL (ref 8.4–10.5)
Chloride: 102 mEq/L (ref 96–112)
Creat: 1.12 mg/dL — ABNORMAL HIGH (ref 0.50–1.10)
Glucose, Bld: 74 mg/dL (ref 70–99)
Potassium: 3.9 mEq/L (ref 3.5–5.3)
Sodium: 140 mEq/L (ref 135–145)

## 2011-03-25 NOTE — Assessment & Plan Note (Signed)
Will start Omeprazole today and see how her symptoms improve.

## 2011-03-25 NOTE — Assessment & Plan Note (Signed)
Patient need to follow up with Ophthalmology. Awaiting until patient will receive her insurance card.

## 2011-03-25 NOTE — Assessment & Plan Note (Signed)
Patient would like to quit. I have discussed about the benefit and risk of Chantix and I think at this point patient is not a candidate considering significant nausea , abnormal dreams especially with history of depression, Insomnia . Patient is open about other options therefore I will refer patient to SW to assist with it.

## 2011-03-25 NOTE — Assessment & Plan Note (Signed)
Blood pressure significantly improved. Her diastolic blood pressure continues to be elevated. At this point I will continue to monitor closely and have a reevaluated during next office visit. I will check bmet today for possible changes in management.

## 2011-03-25 NOTE — Assessment & Plan Note (Signed)
Improved after blood pressure control. We will continue to monitor.

## 2011-04-02 ENCOUNTER — Other Ambulatory Visit: Payer: Self-pay | Admitting: Internal Medicine

## 2011-04-02 ENCOUNTER — Telehealth: Payer: Self-pay | Admitting: Licensed Clinical Social Worker

## 2011-04-02 DIAGNOSIS — F17200 Nicotine dependence, unspecified, uncomplicated: Secondary | ICD-10-CM

## 2011-04-02 MED ORDER — NICOTINE 21 MG/24HR TD PT24: 1.0000 | MEDICATED_PATCH | TRANSDERMAL | Status: AC

## 2011-04-02 NOTE — Telephone Encounter (Signed)
Extensive conversation w/ patient to assist her in quitting smoking.  Patient smoking up to 1 1/2 pack of cigarettes per day.  She had success in quitting last year from Dec 31 to March and then started up again.   She said she tried to keep busy w/ her writing and outings but then went back to smoking due to stress.  Her reasons for quitting are for her family and 31 year old son. Patient lives w/ her mother who is a non smoker but has a lot of friends who smoke and she will be around them during the holidays.   Various tips shared w/ patient to find substitute item or low calorie snack as replacement.  To work on making apartment smoke free and begin smoking outdoors and reducing number of cigarettes she smokes until quit day.   Discussed NRT patches and patient has Medicaid to finance these. She is not a candidate for Chantix due to depression.  I told patient I would let Dr. Loistine Chance know to have generic nicotine patches called in to her pharmacy at her next appmt.  Patient plan is to follow various tips, use sunflower seeds, write poetry, avoid smokers, and utilize patches. Handouts will be sent in mail for patient to read.  Also, encouraged her to call 1-800 Quit Now for additional support.   The patient is eager to quit smoking and has a planned quit date for the date of her next appointment when she can pick up the patches.  Patient is receptive to the plan.  Will let Dr. Loistine Chance know.

## 2011-04-22 DIAGNOSIS — Z8742 Personal history of other diseases of the female genital tract: Secondary | ICD-10-CM

## 2011-04-22 HISTORY — DX: Personal history of other diseases of the female genital tract: Z87.42

## 2011-05-02 ENCOUNTER — Other Ambulatory Visit: Payer: Self-pay | Admitting: Internal Medicine

## 2011-05-02 DIAGNOSIS — F329 Major depressive disorder, single episode, unspecified: Secondary | ICD-10-CM

## 2011-05-02 DIAGNOSIS — I10 Essential (primary) hypertension: Secondary | ICD-10-CM

## 2011-05-02 DIAGNOSIS — F32A Depression, unspecified: Secondary | ICD-10-CM

## 2011-05-05 ENCOUNTER — Encounter: Payer: Medicaid Other | Admitting: Internal Medicine

## 2011-06-11 ENCOUNTER — Other Ambulatory Visit: Payer: Self-pay | Admitting: Internal Medicine

## 2011-06-11 DIAGNOSIS — I10 Essential (primary) hypertension: Secondary | ICD-10-CM

## 2011-06-12 NOTE — Telephone Encounter (Signed)
Patient need an appointment to follow up on renal function since Creatinine mildly elevated. Therefore only giving one month supply without any refills

## 2011-06-13 NOTE — Telephone Encounter (Signed)
Request for appointment sent to front desk 

## 2011-07-04 ENCOUNTER — Encounter: Payer: Medicaid Other | Admitting: Internal Medicine

## 2011-07-24 ENCOUNTER — Ambulatory Visit (INDEPENDENT_AMBULATORY_CARE_PROVIDER_SITE_OTHER): Payer: Medicaid Other | Admitting: Internal Medicine

## 2011-07-24 ENCOUNTER — Encounter: Payer: Self-pay | Admitting: Internal Medicine

## 2011-07-24 VITALS — BP 135/91 | HR 65 | Temp 97.2°F | Ht 66.0 in | Wt 296.6 lb

## 2011-07-24 DIAGNOSIS — F32A Depression, unspecified: Secondary | ICD-10-CM

## 2011-07-24 DIAGNOSIS — I10 Essential (primary) hypertension: Secondary | ICD-10-CM

## 2011-07-24 DIAGNOSIS — R51 Headache: Secondary | ICD-10-CM

## 2011-07-24 DIAGNOSIS — N83209 Unspecified ovarian cyst, unspecified side: Secondary | ICD-10-CM

## 2011-07-24 DIAGNOSIS — F329 Major depressive disorder, single episode, unspecified: Secondary | ICD-10-CM

## 2011-07-24 MED ORDER — HYDROCHLOROTHIAZIDE 25 MG PO TABS: 25.0000 mg | ORAL_TABLET | Freq: Every day | ORAL | Status: DC

## 2011-07-24 MED ORDER — CYCLOBENZAPRINE HCL 10 MG PO TABS: 10.0000 mg | ORAL_TABLET | Freq: Three times a day (TID) | ORAL | Status: AC | PRN

## 2011-07-24 MED ORDER — CARVEDILOL 6.25 MG PO TABS: 6.2500 mg | ORAL_TABLET | Freq: Two times a day (BID) | ORAL | Status: DC

## 2011-07-24 MED ORDER — AMLODIPINE BESYLATE 10 MG PO TABS: 10.0000 mg | ORAL_TABLET | Freq: Every day | ORAL | Status: DC

## 2011-07-24 NOTE — Progress Notes (Signed)
Patient ID: Jeanette Gamble, female   DOB: 01-20-1981, 31 y.o.   MRN: [id number] 31 year old woman with past medical history listed below comes for blood pressure check No new complaints today. Compliant with her medications.  Physical exam   General Appearance:     Filed Vitals:   07/24/11 1601  BP: 135/91  Pulse: 65  Temp: 97.2 F (36.2 C)  TempSrc: Oral  Height: 5\' 6"  (1.676 m)  Weight: 296 lb 9.6 oz (134.537 kg)     Alert, cooperative, no distress, appears stated age  Head:    Normocephalic, without obvious abnormality, atraumatic  Eyes:    PERRL, conjunctiva/corneas clear, EOM's intact, fundi    benign, both eyes       Neck:   Supple, symmetrical, trachea midline, no adenopathy;       thyroid:  No enlargement/tenderness/nodules; no carotid   bruit or JVD  Lungs:     Clear to auscultation bilaterally, respirations unlabored  Chest wall:    No tenderness or deformity  Heart:    Regular rate and rhythm, S1 and S2 normal, no murmur, rub   or gallop  Abdomen:     Soft, non-tender, bowel sounds active all four quadrants,    no masses, no organomegaly  Extremities:   Extremities normal, atraumatic, no cyanosis or edema  Pulses:   2+ and symmetric all extremities  Skin:   Skin color, texture, turgor normal, no rashes or lesions  Neurologic:  nonfocal grossly    Review of systems  Constitutional: Denies fever, chills, diaphoresis, appetite change and fatigue.  Respiratory: Denies SOB, DOE, cough, chest tightness,  and wheezing.   Cardiovascular: Denies chest pain, palpitations and leg swelling.  Gastrointestinal: Denies nausea, vomiting, abdominal pain, diarrhea, constipation, blood in stool and abdominal distention.  Skin: Denies pallor, rash and wound.  Neurological: Denies dizziness, light-headedness, numbness and headaches.

## 2011-07-24 NOTE — Assessment & Plan Note (Signed)
Acceptable with her current risk factors Continue current medications. No change in medicines recently so no need for chemistry checked today Followup in 6 months if blood pressure controlled when she checks it at the pharmacy. Come back sooner if blood pressure consistently above 150 systolic

## 2011-10-16 ENCOUNTER — Encounter: Payer: Medicaid Other | Admitting: Obstetrics & Gynecology

## 2011-11-10 ENCOUNTER — Encounter: Payer: Medicaid Other | Admitting: Obstetrics & Gynecology

## 2011-11-18 NOTE — Addendum Note (Signed)
Addended by: Neomia Dear on: 11/18/2011 05:54 PM   Modules accepted: Orders

## 2011-12-02 ENCOUNTER — Encounter: Payer: Self-pay | Admitting: Internal Medicine

## 2011-12-31 ENCOUNTER — Encounter: Payer: Self-pay | Admitting: Internal Medicine

## 2011-12-31 DIAGNOSIS — IMO0001 Reserved for inherently not codable concepts without codable children: Secondary | ICD-10-CM

## 2011-12-31 DIAGNOSIS — R896 Abnormal cytological findings in specimens from other organs, systems and tissues: Secondary | ICD-10-CM

## 2011-12-31 HISTORY — DX: Reserved for inherently not codable concepts without codable children: IMO0001

## 2012-01-06 ENCOUNTER — Emergency Department (HOSPITAL_COMMUNITY): Payer: Medicaid Other

## 2012-01-06 ENCOUNTER — Encounter (HOSPITAL_COMMUNITY): Payer: Self-pay | Admitting: Emergency Medicine

## 2012-01-06 ENCOUNTER — Emergency Department (HOSPITAL_COMMUNITY)
Admission: EM | Admit: 2012-01-06 | Discharge: 2012-01-06 | Disposition: A | Discharge status: Home / Self Care | Payer: Medicaid Other | Attending: Emergency Medicine | Admitting: Emergency Medicine

## 2012-01-06 DIAGNOSIS — Y990 Civilian activity done for income or pay: Secondary | ICD-10-CM | POA: Insufficient documentation

## 2012-01-06 DIAGNOSIS — IMO0002 Reserved for concepts with insufficient information to code with codable children: Secondary | ICD-10-CM | POA: Insufficient documentation

## 2012-01-06 DIAGNOSIS — Z87891 Personal history of nicotine dependence: Secondary | ICD-10-CM | POA: Insufficient documentation

## 2012-01-06 DIAGNOSIS — I10 Essential (primary) hypertension: Secondary | ICD-10-CM | POA: Insufficient documentation

## 2012-01-06 DIAGNOSIS — W19XXXA Unspecified fall, initial encounter: Secondary | ICD-10-CM | POA: Insufficient documentation

## 2012-01-06 DIAGNOSIS — S76919A Strain of unspecified muscles, fascia and tendons at thigh level, unspecified thigh, initial encounter: Secondary | ICD-10-CM

## 2012-01-06 MED ORDER — KETOROLAC TROMETHAMINE 60 MG/2ML IM SOLN
60.0000 mg | Freq: Once | INTRAMUSCULAR | Status: AC
  Administered 2012-01-06: 60 mg via INTRAMUSCULAR
  Filled 2012-01-06: qty 2

## 2012-01-06 MED ORDER — OXYCODONE-ACETAMINOPHEN 5-325 MG PO TABS: 1.0000 | ORAL_TABLET | ORAL | Status: DC | PRN

## 2012-01-06 MED ORDER — OXYCODONE-ACETAMINOPHEN 5-325 MG PO TABS
2.0000 | ORAL_TABLET | Freq: Once | ORAL | Status: AC
  Administered 2012-01-06: 2 via ORAL
  Filled 2012-01-06: qty 2
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
## 2012-01-06 NOTE — ED Notes (Signed)
Pt sts fell last night while at work and having left leg pain after fall

## 2012-01-06 NOTE — ED Notes (Signed)
NAD noted at time of d/c home with family 

## 2012-01-06 NOTE — Progress Notes (Signed)
Orthopedic Tech Progress Note Patient Details:  Jeanette Gamble 12-17-1980 [id number]  Ortho Devices Type of Ortho Device: Crutches Ortho Device/Splint Interventions: Application   Shawnie Pons 01/06/2012, 1:44 PM

## 2012-01-06 NOTE — ED Notes (Signed)
Ortho Tech at bedside doing crutch training.

## 2012-02-17 ENCOUNTER — Telehealth: Payer: Self-pay | Admitting: *Deleted

## 2012-02-17 NOTE — Telephone Encounter (Signed)
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

## 2012-02-18 ENCOUNTER — Encounter: Payer: Self-pay | Admitting: Internal Medicine

## 2012-02-18 ENCOUNTER — Telehealth: Payer: Self-pay | Admitting: *Deleted

## 2012-02-18 ENCOUNTER — Ambulatory Visit (INDEPENDENT_AMBULATORY_CARE_PROVIDER_SITE_OTHER): Payer: Medicaid Other | Admitting: Internal Medicine

## 2012-02-18 VITALS — BP 138/100 | HR 83 | Temp 98.7°F | Ht 66.0 in | Wt 262.7 lb

## 2012-02-18 DIAGNOSIS — M109 Gout, unspecified: Secondary | ICD-10-CM

## 2012-02-18 DIAGNOSIS — M2550 Pain in unspecified joint: Secondary | ICD-10-CM

## 2012-02-18 DIAGNOSIS — M1A9XX0 Chronic gout, unspecified, without tophus (tophi): Secondary | ICD-10-CM | POA: Insufficient documentation

## 2012-02-18 HISTORY — DX: Gout, unspecified: M10.9

## 2012-02-18 HISTORY — DX: Chronic gout, unspecified, without tophus (tophi): M1A.9XX0

## 2012-02-18 LAB — CBC WITH DIFFERENTIAL/PLATELET
Basophils Absolute: 0 10*3/uL (ref 0.0–0.1)
Basophils Relative: 0 % (ref 0–1)
Eosinophils Absolute: 0.2 10*3/uL (ref 0.0–0.7)
Eosinophils Relative: 2 % (ref 0–5)
HCT: 38.7 % (ref 36.0–46.0)
Hemoglobin: 13.1 g/dL (ref 12.0–15.0)
Lymphocytes Relative: 18 % (ref 12–46)
Lymphs Abs: 1.9 10*3/uL (ref 0.7–4.0)
MCH: 29.5 pg (ref 26.0–34.0)
MCHC: 33.9 g/dL (ref 30.0–36.0)
MCV: 87.2 fL (ref 78.0–100.0)
Monocytes Absolute: 0.4 10*3/uL (ref 0.1–1.0)
Monocytes Relative: 4 % (ref 3–12)
Neutro Abs: 8.1 10*3/uL — ABNORMAL HIGH (ref 1.7–7.7)
Neutrophils Relative %: 76 % (ref 43–77)
Platelets: 266 10*3/uL (ref 150–400)
RBC: 4.44 MIL/uL (ref 3.87–5.11)
RDW: 13.1 % (ref 11.5–15.5)
WBC: 10.6 10*3/uL — ABNORMAL HIGH (ref 4.0–10.5)

## 2012-02-18 LAB — BASIC METABOLIC PANEL WITH GFR
BUN: 12 mg/dL (ref 6–23)
CO2: 27 mEq/L (ref 19–32)
Calcium: 9.7 mg/dL (ref 8.4–10.5)
Chloride: 103 mEq/L (ref 96–112)
Creat: 1.07 mg/dL (ref 0.50–1.10)
GFR, Est African American: 79 mL/min
GFR, Est Non African American: 69 mL/min
Glucose, Bld: 104 mg/dL — ABNORMAL HIGH (ref 70–99)
Potassium: 3.6 mEq/L (ref 3.5–5.3)
Sodium: 137 mEq/L (ref 135–145)

## 2012-02-18 LAB — URIC ACID: Uric Acid, Serum: 9.2 mg/dL — ABNORMAL HIGH (ref 2.4–7.0)

## 2012-02-18 LAB — SEDIMENTATION RATE: Sed Rate: 12 mm/hr (ref 0–22)

## 2012-02-18 MED ORDER — INDOMETHACIN 50 MG PO CAPS: ORAL_CAPSULE | ORAL | Status: DC

## 2012-02-18 MED ORDER — COLCHICINE 0.6 MG PO TABS: ORAL_TABLET | ORAL | Status: DC

## 2012-02-18 MED ORDER — KETOROLAC TROMETHAMINE 30 MG/ML IJ SOLN
30.0000 mg | Freq: Once | INTRAMUSCULAR | Status: AC
  Administered 2012-02-18: 30 mg via INTRAMUSCULAR

## 2012-02-18 MED ORDER — HYDROCODONE-ACETAMINOPHEN 5-500 MG PO TABS: 1.0000 | ORAL_TABLET | Freq: Three times a day (TID) | ORAL | Status: DC | PRN

## 2012-02-18 NOTE — Progress Notes (Signed)
  Subjective:    Patient ID: Jeanette Gamble, female    DOB: [birth year], 31 y.o.   MRN: [id number]  HPI Presents today with a complaint of 4 day history of swollen left foot and great toe.  Pt states she woke up Sunday with pain in the foot, unable to move her left great toe. It is warm and swollen.  She states she has pain that shoots up to the knee. Denies trauma. She has tried using icy hot, warm towels without relief.   Admits to perhaps some chills. No fever.  Denies any other joints hurting.  Denies urinary symptoms, vaginal discharge.  States she is engaged and only has one partner.   States the pain is 10/10. Denies any episodes like this before.   Review of Systems Rest of ROS is otherwise negative except for that stated in the HPI.    Objective:   Physical Exam Filed Vitals:   02/18/12 1012  BP: 138/100  Pulse: 83  Temp: 98.7 F (37.1 C)   GEN: AAOx3, minimal distress. CV: S1S2, no m/r/g, RRR. PULM: CTA bilat LE: Edema and erythema over left great toe, limited range of motion due to pain. Left great toe MT joint tender to touch. Trace - 1+ pitting edema LLE/ trace pitting edema RLE.       Assessment & Plan:  31 yr. Old female with hx morbid obesity, tobacco abuse, alcohol abuse, HTN, depression, hx ascus, presents due to left great toe pain. 1) Acute gout: At this time I offered joint aspiration to rule out infectious causes, but she declines this procedure after weighing risks vs. Benefits of joint aspiration. This is likely acute gout, but I again recommended joint aspiration for culture and analysis, which she declines. Plan at this time is to treat as gout and return to clinic in two days to see if she has improved. Toradol 30 mg IM x 1.  Start colcrys 1.2 mg PO x 1, then 0.6 mg 1 hr later. Continue 0.6 mg PO daily. Will check CBC w/ diff, uric acid level, BMP. Start allopurinol next visit if improvement. I will also write for Vicodin to take 1 tablet every 8 hours as  needed for severe pain. Return to clinic in two days.  Jeanette Gamble  Addendum: She cannot afford Colcrys. Will try indomethacin taper dose over approx 2 weeks.  Jeanette Gamble

## 2012-02-18 NOTE — Telephone Encounter (Signed)
ok 

## 2012-02-18 NOTE — Progress Notes (Signed)
  This patient is a CHRONIC NO-SHOW PATIENT that has a history of HYPERTENSION.  Please make sure to address hypertension during her next clinic appointment, and intervene as appropriate.    Within the AVS, please incorporate the following smartphrase: .htntips   Pertinent Data: BP Readings from Last 3 Encounters:  02/18/12 138/100  01/06/12 145/97  07/24/11 135/91    BMI: Estimated Body mass index is 42.40 kg/(m^2) as calculated from the following:   Height as of an earlier encounter on 02/18/12: 5\' 6" (1.676 m).   Weight as of an earlier encounter on 02/18/12: 262 lb 11.2 oz(119.16 kg).  Smoking History: History  Smoking status  . Current Some Day Smoker -- 0.8 packs/day  . Types: Cigarettes  . Last Attempt to Quit: 04/28/2011  Smokeless tobacco  . Not on file    Last Basic Metabolic Panel:    Component Value Date/Time   NA 140 03/24/2011 1641   K 3.9 03/24/2011 1641   CL 102 03/24/2011 1641   CO2 24 03/24/2011 1641   BUN 17 03/24/2011 1641   CREATININE 1.12* 03/24/2011 1641   CREATININE 0.95 06/21/2009 1538   GLUCOSE 74 03/24/2011 1641   CALCIUM 9.6 03/24/2011 1641    Allergies: No Known Allergies

## 2012-02-18 NOTE — Patient Instructions (Addendum)
-  blood work today -return in 2 days to be re-evaluated. -take colcrys (colchicine) as prescribed. -use vicodin for severe pain. -if fever, chills, or worsening joint pain call clinic right away.

## 2012-02-18 NOTE — Progress Notes (Deleted)
Subjective:     Jeanette Gamble is a 31 y.o. female who presents with possible gout. Pain is located in {pain location list:14040}, and has been present for 4 days. Pain is described as burning, sharp, shooting, stabbing, throbbing and tingling, and is constant. Associated symptoms: decreased range of motion, edema, effusion, erythema, tenderness and warmth. The patient has tried muscle relaxants with no improvement. Related to injury: {yes***/no:311194}.  {Common ambulatory SmartLinks:19316}  Review of Systems {ros; complete:30496}    Objective:    {exam; complete:17964}     Assessment:    Gout flare up and Gouty flare up     Plan:    {Plan; Gout:6050120683}

## 2012-02-18 NOTE — Telephone Encounter (Signed)
Pt called left message that colchicine cost $162.00 - too expensive. Dr Kem Kays aware - sent in Rx for Indocin. Talked with pt aware of change. Stanton Kidney Elliana Bal RN 02/18/12 4:30PM

## 2012-02-19 ENCOUNTER — Telehealth: Payer: Self-pay | Admitting: Internal Medicine

## 2012-02-19 NOTE — Telephone Encounter (Signed)
Patient called me back. She states her foot is feeling much better after Toradol yesterday. She is going to fill indomethacin today, she could not afford Colcrys. I advised her we should start allopurinol soon as her uric acid level is elevated, but we could wait until she returns for follow up. She denies fever, chills. The swelling is improved.

## 2012-02-19 NOTE — Telephone Encounter (Signed)
Called patient at listed number to inquire about how she is feeling in addition to discuss lab results, no answer.

## 2012-02-20 ENCOUNTER — Encounter: Payer: Self-pay | Admitting: Internal Medicine

## 2012-02-20 ENCOUNTER — Ambulatory Visit (INDEPENDENT_AMBULATORY_CARE_PROVIDER_SITE_OTHER): Payer: Medicaid Other | Admitting: Internal Medicine

## 2012-02-20 VITALS — BP 121/91 | HR 93 | Temp 97.6°F | Ht 66.0 in | Wt 265.3 lb

## 2012-02-20 DIAGNOSIS — Z0000 Encounter for general adult medical examination without abnormal findings: Secondary | ICD-10-CM | POA: Insufficient documentation

## 2012-02-20 DIAGNOSIS — M109 Gout, unspecified: Secondary | ICD-10-CM

## 2012-02-20 DIAGNOSIS — Z23 Encounter for immunization: Secondary | ICD-10-CM

## 2012-02-20 MED ORDER — INDOMETHACIN 50 MG PO CAPS: ORAL_CAPSULE | ORAL | Status: DC

## 2012-02-20 NOTE — Progress Notes (Signed)
  Subjective:    Patient ID: Jeanette Gamble, female    DOB: [birth year], 31 y.o.   MRN: [id number]  HPI  31 year old woman with hypertesion and ASCUS diagnosed on pap smear last year returning for followup after being diagnosed with acute gout arthritis on 10/30 by Dr. Kem Kays.  Rx for indomethacin was written, but insuffienct tabs to cover prescribed directions so pharmacy would not fill.  She returns today with some slight subjective improvement in pain but no change in appearence of her foot without having taken any medicines.  She denies worsened swelling or arthralgia and she also denies having chills, fevers, nausea, vomiting, and diarrhea.    Review of Systems  Constitutional: Negative for fever and chills.  Gastrointestinal: Negative for nausea, vomiting and diarrhea.  Musculoskeletal: Positive for joint swelling and arthralgias.       Objective:   Physical Exam  GENERAL:  Alert, oriented, in no acute distress, nontoxic and not ill appearing RIGHT FOOT/ANKLE:  Normal in appearance, normal range of motion without tenderness of the joints of the foot and ankle. LEFT FOOT/ANKLE:  Swelling and erythema originating at the 1st MTP joint and extending over the dorsum.  Tenderness with light touch of first toe and dorsum.  Pain with range of motion of MTPs and ankle.  Range of motion not restricted.  Tenderness with palpation of medial and lateral ankle joint but no warmth, erythema or swelling.        Assessment & Plan:

## 2012-02-20 NOTE — Assessment & Plan Note (Signed)
LDL goal is < 160; last LDL was 142 a year ago.  Not currently on lipid lowering agent and does not need to be.  Counseled patient on life-style modifications re diet and exercise.  Needs screening about every 5 years.  Blood pressure is at goal 121/91 on triple therapy.  Flu shot today.  Return in 2 weeks for pelvic exam and pap smear; ASCUS last year requires annual follow-up.

## 2012-02-20 NOTE — Assessment & Plan Note (Signed)
History and physical remains consistent with acute gout arthritis.  Septic arthritis and cellulitis less likely since there is some subjective improvement but certainly no worsening without treatment over the past 2 days.  She remains afebrile.  We will resend a rx to [facility] for indomethacin:  30mg  TID x5, 30mg  BID x5, 30mg  QD x5; disp 30.  We will have her follow up in 2 weeks at which point we will check uric acid level and consider initiating maintenance therapy. - indomethacin - 2 week follow up

## 2012-02-20 NOTE — Patient Instructions (Signed)
Gout       Gout is an inflammatory condition (arthritis) caused by a buildup of uric acid crystals in the joints. Uric acid is a chemical that is normally present in the blood. Under some circumstances, uric acid can form into crystals in your joints. This causes joint redness, soreness, and swelling (inflammation). Repeat attacks are common. Over time, uric acid crystals can form into masses (tophi) near a joint, causing disfigurement. Gout is treatable and often preventable.   CAUSES   The disease begins with elevated levels of uric acid in the blood. Uric acid is produced by your body when it breaks down a naturally found substance called purines. This also happens when you eat certain foods such as meats and fish. Causes of an elevated uric acid level include:   Being passed down from parent to child (heredity).   Diseases that cause increased uric acid production (obesity, psoriasis, some cancers).   Excessive alcohol use.   Diet, especially diets rich in meat and seafood.   Medicines, including certain cancer-fighting drugs (chemotherapy), diuretics, and aspirin.   Chronic kidney disease. The kidneys are no longer able to remove uric acid well.   Problems with metabolism.  Conditions strongly associated with gout include:   Obesity.   High blood pressure.   High cholesterol.   Diabetes.  Not everyone with elevated uric acid levels gets gout. It is not understood why some people get gout and others do not. Surgery, joint injury, and eating too much of certain foods are some of the factors that can lead to gout.   SYMPTOMS   An attack of gout comes on quickly. It causes intense pain with redness, swelling, and warmth in a joint.   Fever can occur.   Often, only one joint is involved. Certain joints are more commonly involved:   Base of the big toe.   Knee.   Ankle.   Wrist.   Finger.  Without treatment, an attack usually goes away in a few days to weeks. Between attacks, you usually will not have symptoms, which  is different from many other forms of arthritis.   DIAGNOSIS   Your caregiver will suspect gout based on your symptoms and exam. Removal of fluid from the joint (arthrocentesis) is done to check for uric acid crystals. Your caregiver will give you a medicine that numbs the area (local anesthetic) and use a needle to remove joint fluid for exam. Gout is confirmed when uric acid crystals are seen in joint fluid, using a special microscope. Sometimes, blood, urine, and X-ray tests are also used.   TREATMENT   There are 2 phases to gout treatment: treating the sudden onset (acute) attack and preventing attacks (prophylaxis).   Treatment of an Acute Attack   Medicines are used. These include anti-inflammatory medicines or steroid medicines.   An injection of steroid medicine into the affected joint is sometimes necessary.   The painful joint is rested. Movement can worsen the arthritis.   You may use warm or cold treatments on painful joints, depending which works best for you.   Discuss the use of coffee, vitamin C, or cherries with your caregiver. These may be helpful treatment options.  Treatment to Prevent Attacks   After the acute attack subsides, your caregiver may advise prophylactic medicine. These medicines either help your kidneys eliminate uric acid from your body or decrease your uric acid production. You may need to stay on these medicines for a very long time.     The early phase of treatment with prophylactic medicine can be associated with an increase in acute gout attacks. For this reason, during the first few months of treatment, your caregiver may also advise you to take medicines usually used for acute gout treatment. Be sure you understand your caregiver's directions.   You should also discuss dietary treatment with your caregiver. Certain foods such as meats and fish can increase uric acid levels. Other foods such as dairy can decrease levels. Your caregiver can give you a list of foods to avoid.    HOME CARE INSTRUCTIONS   Do not take aspirin to relieve pain. This raises uric acid levels.   Only take over-the-counter or prescription medicines for pain, discomfort, or fever as directed by your caregiver.   Rest the joint as much as possible. When in bed, keep sheets and blankets off painful areas.   Keep the affected joint raised (elevated).   Use crutches if the painful joint is in your leg.   Drink enough water and fluids to keep your urine clear or pale yellow. This helps your body get rid of uric acid. Do not drink alcoholic beverages. They slow the passage of uric acid.   Follow your caregiver's dietary instructions. Pay careful attention to the amount of protein you eat. Your daily diet should emphasize fruits, vegetables, whole grains, and fat-free or low-fat milk products.   Maintain a healthy body weight.  SEEK MEDICAL CARE IF:   You have an oral temperature above 102 F (38.9 C).   You develop diarrhea, vomiting, or any side effects from medicines.   You do not feel better in 24 hours, or you are getting worse.  SEEK IMMEDIATE MEDICAL CARE IF:   Your joint becomes suddenly more tender and you have:   Chills.   An oral temperature above 102 F (38.9 C), not controlled by medicine.  MAKE SURE YOU:   Understand these instructions.   Will watch your condition.   Will get help right away if you are not doing well or get worse.

## 2012-02-23 NOTE — Progress Notes (Signed)
I saw, examined, and discussed the patient with Dr Earlene Plater and agree with the note contained here.

## 2012-03-10 ENCOUNTER — Ambulatory Visit: Payer: Medicaid Other | Admitting: Internal Medicine

## 2012-04-02 ENCOUNTER — Other Ambulatory Visit: Payer: Self-pay | Admitting: Internal Medicine

## 2012-06-10 ENCOUNTER — Encounter: Payer: Medicaid Other | Admitting: Internal Medicine

## 2012-06-21 ENCOUNTER — Ambulatory Visit: Payer: Medicaid Other | Admitting: Internal Medicine

## 2012-07-28 ENCOUNTER — Other Ambulatory Visit: Payer: Self-pay | Admitting: Internal Medicine

## 2012-07-28 DIAGNOSIS — I10 Essential (primary) hypertension: Secondary | ICD-10-CM

## 2012-07-29 MED ORDER — HYDROCHLOROTHIAZIDE 25 MG PO TABS: 25.0000 mg | ORAL_TABLET | Freq: Every day | ORAL | Status: DC

## 2012-12-03 ENCOUNTER — Other Ambulatory Visit: Payer: Self-pay | Admitting: Internal Medicine

## 2012-12-03 DIAGNOSIS — I10 Essential (primary) hypertension: Secondary | ICD-10-CM

## 2012-12-08 ENCOUNTER — Encounter: Payer: Self-pay | Admitting: Internal Medicine

## 2013-02-08 ENCOUNTER — Ambulatory Visit: Payer: Medicaid Other | Admitting: Internal Medicine

## 2013-02-08 ENCOUNTER — Encounter: Payer: Self-pay | Admitting: Internal Medicine

## 2013-02-10 ENCOUNTER — Encounter: Payer: Medicaid Other | Admitting: Internal Medicine

## 2013-03-01 ENCOUNTER — Other Ambulatory Visit: Payer: Self-pay | Admitting: Internal Medicine

## 2013-03-01 DIAGNOSIS — I10 Essential (primary) hypertension: Secondary | ICD-10-CM

## 2013-03-01 NOTE — Telephone Encounter (Signed)
Patient last seen 1 year ago.  No-showed October appointment.  Has an appointment with Dr. Sherrine Maples on 03/22/2013.  Will provide a 1 month supply to get her to that visit and can renew for longer period of time, if appropriate, at that visit.

## 2013-03-01 NOTE — Telephone Encounter (Signed)
Pt is out of meds, please refill 

## 2013-03-11 ENCOUNTER — Other Ambulatory Visit: Payer: Self-pay

## 2013-03-11 ENCOUNTER — Ambulatory Visit (INDEPENDENT_AMBULATORY_CARE_PROVIDER_SITE_OTHER): Payer: Medicaid Other | Admitting: Internal Medicine

## 2013-03-11 ENCOUNTER — Encounter: Payer: Self-pay | Admitting: Internal Medicine

## 2013-03-11 ENCOUNTER — Telehealth: Payer: Self-pay | Admitting: *Deleted

## 2013-03-11 ENCOUNTER — Emergency Department (HOSPITAL_COMMUNITY): Payer: Medicaid Other

## 2013-03-11 ENCOUNTER — Encounter (HOSPITAL_COMMUNITY): Payer: Self-pay | Admitting: Emergency Medicine

## 2013-03-11 ENCOUNTER — Emergency Department (HOSPITAL_COMMUNITY)
Admission: EM | Admit: 2013-03-11 | Discharge: 2013-03-11 | Discharge status: Against Medical Advice | Payer: Medicaid Other | Attending: Emergency Medicine | Admitting: Emergency Medicine

## 2013-03-11 VITALS — BP 168/122 | HR 65 | Temp 97.5°F | Ht 66.0 in | Wt 261.4 lb

## 2013-03-11 DIAGNOSIS — G43909 Migraine, unspecified, not intractable, without status migrainosus: Secondary | ICD-10-CM | POA: Insufficient documentation

## 2013-03-11 DIAGNOSIS — R109 Unspecified abdominal pain: Secondary | ICD-10-CM

## 2013-03-11 DIAGNOSIS — I10 Essential (primary) hypertension: Secondary | ICD-10-CM | POA: Insufficient documentation

## 2013-03-11 DIAGNOSIS — R079 Chest pain, unspecified: Secondary | ICD-10-CM | POA: Insufficient documentation

## 2013-03-11 DIAGNOSIS — F17200 Nicotine dependence, unspecified, uncomplicated: Secondary | ICD-10-CM | POA: Insufficient documentation

## 2013-03-11 LAB — COMPREHENSIVE METABOLIC PANEL
ALT: 14 U/L (ref 0–35)
AST: 18 U/L (ref 0–37)
Albumin: 3.9 g/dL (ref 3.5–5.2)
Alkaline Phosphatase: 72 U/L (ref 39–117)
BUN: 14 mg/dL (ref 6–23)
CO2: 26 mEq/L (ref 19–32)
Calcium: 9 mg/dL (ref 8.4–10.5)
Chloride: 103 mEq/L (ref 96–112)
Creatinine, Ser: 1.05 mg/dL (ref 0.50–1.10)
GFR calc Af Amer: 80 mL/min — ABNORMAL LOW (ref >90)
GFR calc non Af Amer: 69 mL/min — ABNORMAL LOW (ref >90)
Glucose, Bld: 76 mg/dL (ref 70–99)
Potassium: 3.7 mEq/L (ref 3.5–5.1)
Sodium: 140 mEq/L (ref 135–145)
Total Bilirubin: 0.4 mg/dL (ref 0.3–1.2)
Total Protein: 7.6 g/dL (ref 6.0–8.3)

## 2013-03-11 LAB — CBC
HCT: 37.3 % (ref 36.0–46.0)
Hemoglobin: 12.8 g/dL (ref 12.0–15.0)
MCH: 30.3 pg (ref 26.0–34.0)
MCHC: 34.3 g/dL (ref 30.0–36.0)
MCV: 88.4 fL (ref 78.0–100.0)
Platelets: 226 10*3/uL (ref 150–400)
RBC: 4.22 MIL/uL (ref 3.87–5.11)
RDW: 12.9 % (ref 11.5–15.5)
WBC: 7.2 10*3/uL (ref 4.0–10.5)

## 2013-03-11 LAB — POCT I-STAT TROPONIN I: Troponin i, poc: 0 ng/mL (ref 0.00–0.08)

## 2013-03-11 NOTE — ED Notes (Signed)
Called for pt twice in waiting room - no answer.

## 2013-03-11 NOTE — Telephone Encounter (Signed)
I agree that she needs to be seen today as soon as possible.Thanks
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
## 2013-03-11 NOTE — Telephone Encounter (Signed)
Pt calls and reports she has a migraine for 2 days. Also it has been quite awhile since she has taken her BP medicine, states it may be her BP causing or adding to the migraine. She has episodes of vomiting and nausea and also weakness and sometimes short of breath, it is not noted while speaking to her only noted a soft voice and sounds tired.does not know if she has had fevers.  appt set for 1445 dr Mariea Clonts, she will try to arrive early

## 2013-03-11 NOTE — ED Notes (Signed)
Name called twice with no answer

## 2013-03-11 NOTE — ED Notes (Signed)
Pt c/o migraine, nausea and L chest pain (reproducible) since yesterday.  States L sided chest pain increases when she coughs.

## 2013-03-12 ENCOUNTER — Other Ambulatory Visit: Payer: Self-pay

## 2013-03-12 ENCOUNTER — Encounter (HOSPITAL_COMMUNITY): Payer: Self-pay | Admitting: Emergency Medicine

## 2013-03-12 ENCOUNTER — Emergency Department (HOSPITAL_COMMUNITY)
Admission: EM | Admit: 2013-03-12 | Discharge: 2013-03-12 | Disposition: A | Discharge status: Home / Self Care | Payer: Medicaid Other | Attending: Emergency Medicine | Admitting: Emergency Medicine

## 2013-03-12 DIAGNOSIS — Z79899 Other long term (current) drug therapy: Secondary | ICD-10-CM | POA: Insufficient documentation

## 2013-03-12 DIAGNOSIS — R0789 Other chest pain: Secondary | ICD-10-CM | POA: Insufficient documentation

## 2013-03-12 DIAGNOSIS — B9689 Other specified bacterial agents as the cause of diseases classified elsewhere: Secondary | ICD-10-CM | POA: Insufficient documentation

## 2013-03-12 DIAGNOSIS — N760 Acute vaginitis: Secondary | ICD-10-CM | POA: Insufficient documentation

## 2013-03-12 DIAGNOSIS — M549 Dorsalgia, unspecified: Secondary | ICD-10-CM | POA: Insufficient documentation

## 2013-03-12 DIAGNOSIS — R6883 Chills (without fever): Secondary | ICD-10-CM | POA: Insufficient documentation

## 2013-03-12 DIAGNOSIS — Z3202 Encounter for pregnancy test, result negative: Secondary | ICD-10-CM | POA: Insufficient documentation

## 2013-03-12 DIAGNOSIS — Z8619 Personal history of other infectious and parasitic diseases: Secondary | ICD-10-CM | POA: Insufficient documentation

## 2013-03-12 DIAGNOSIS — N730 Acute parametritis and pelvic cellulitis: Secondary | ICD-10-CM

## 2013-03-12 DIAGNOSIS — N12 Tubulo-interstitial nephritis, not specified as acute or chronic: Secondary | ICD-10-CM | POA: Insufficient documentation

## 2013-03-12 DIAGNOSIS — I10 Essential (primary) hypertension: Secondary | ICD-10-CM | POA: Insufficient documentation

## 2013-03-12 DIAGNOSIS — A499 Bacterial infection, unspecified: Secondary | ICD-10-CM | POA: Insufficient documentation

## 2013-03-12 DIAGNOSIS — F1021 Alcohol dependence, in remission: Secondary | ICD-10-CM | POA: Insufficient documentation

## 2013-03-12 DIAGNOSIS — F17200 Nicotine dependence, unspecified, uncomplicated: Secondary | ICD-10-CM | POA: Insufficient documentation

## 2013-03-12 DIAGNOSIS — M109 Gout, unspecified: Secondary | ICD-10-CM | POA: Insufficient documentation

## 2013-03-12 DIAGNOSIS — R51 Headache: Secondary | ICD-10-CM | POA: Insufficient documentation

## 2013-03-12 DIAGNOSIS — N739 Female pelvic inflammatory disease, unspecified: Secondary | ICD-10-CM | POA: Insufficient documentation

## 2013-03-12 DIAGNOSIS — Z87828 Personal history of other (healed) physical injury and trauma: Secondary | ICD-10-CM | POA: Insufficient documentation

## 2013-03-12 LAB — URINALYSIS, ROUTINE W REFLEX MICROSCOPIC
Glucose, UA: NEGATIVE mg/dL
Ketones, ur: 15 mg/dL — AB
Nitrite: NEGATIVE
Protein, ur: 30 mg/dL — AB
Specific Gravity, Urine: 1.024 (ref 1.005–1.030)
Urobilinogen, UA: 1 mg/dL (ref 0.0–1.0)
pH: 6 (ref 5.0–8.0)

## 2013-03-12 LAB — URINE MICROSCOPIC-ADD ON

## 2013-03-12 LAB — WET PREP, GENITAL: Yeast Wet Prep HPF POC: NONE SEEN

## 2013-03-12 LAB — BASIC METABOLIC PANEL
BUN: 12 mg/dL (ref 6–23)
CO2: 27 mEq/L (ref 19–32)
Calcium: 9.2 mg/dL (ref 8.4–10.5)
Chloride: 103 mEq/L (ref 96–112)
Creatinine, Ser: 1.02 mg/dL (ref 0.50–1.10)
GFR calc Af Amer: 83 mL/min — ABNORMAL LOW (ref >90)
GFR calc non Af Amer: 71 mL/min — ABNORMAL LOW (ref >90)
Glucose, Bld: 82 mg/dL (ref 70–99)
Potassium: 3.7 mEq/L (ref 3.5–5.1)
Sodium: 139 mEq/L (ref 135–145)

## 2013-03-12 LAB — CBC
HCT: 37.9 % (ref 36.0–46.0)
Hemoglobin: 13.1 g/dL (ref 12.0–15.0)
MCH: 30.5 pg (ref 26.0–34.0)
MCHC: 34.6 g/dL (ref 30.0–36.0)
MCV: 88.3 fL (ref 78.0–100.0)
Platelets: 226 10*3/uL (ref 150–400)
RBC: 4.29 MIL/uL (ref 3.87–5.11)
RDW: 12.8 % (ref 11.5–15.5)
WBC: 8.3 10*3/uL (ref 4.0–10.5)

## 2013-03-12 LAB — POCT PREGNANCY, URINE: Preg Test, Ur: NEGATIVE

## 2013-03-12 LAB — POCT I-STAT TROPONIN I: Troponin i, poc: 0.01 ng/mL (ref 0.00–0.08)

## 2013-03-12 MED ORDER — CEPHALEXIN 500 MG PO CAPS: 500.0000 mg | ORAL_CAPSULE | Freq: Four times a day (QID) | ORAL | Status: DC

## 2013-03-12 MED ORDER — AZITHROMYCIN 250 MG PO TABS
1000.0000 mg | ORAL_TABLET | Freq: Once | ORAL | Status: AC
  Administered 2013-03-12: 1000 mg via ORAL
  Filled 2013-03-12: qty 4

## 2013-03-12 MED ORDER — CEFTRIAXONE SODIUM 250 MG IJ SOLR
250.0000 mg | Freq: Once | INTRAMUSCULAR | Status: AC
  Administered 2013-03-12: 250 mg via INTRAMUSCULAR
  Filled 2013-03-12: qty 250

## 2013-03-12 MED ORDER — ONDANSETRON HCL 4 MG PO TABS: 4.0000 mg | ORAL_TABLET | Freq: Four times a day (QID) | ORAL | Status: DC

## 2013-03-12 MED ORDER — LIDOCAINE HCL (PF) 1 % IJ SOLN
INTRAMUSCULAR | Status: AC
  Administered 2013-03-12: 0.9 mL
  Filled 2013-03-12: qty 5

## 2013-03-12 MED ORDER — ONDANSETRON HCL 4 MG/2ML IJ SOLN
4.0000 mg | Freq: Once | INTRAMUSCULAR | Status: AC
  Administered 2013-03-12: 4 mg via INTRAVENOUS
  Filled 2013-03-12: qty 2

## 2013-03-12 MED ORDER — SODIUM CHLORIDE 0.9 % IV BOLUS (SEPSIS)
1000.0000 mL | INTRAVENOUS | Status: AC
  Administered 2013-03-12: 1000 mL via INTRAVENOUS

## 2013-03-12 MED ORDER — MORPHINE SULFATE 4 MG/ML IJ SOLN: 4.0000 mg | Freq: Once | INTRAMUSCULAR | Status: DC

## 2013-03-12 MED ORDER — METRONIDAZOLE 500 MG PO TABS: 500.0000 mg | ORAL_TABLET | Freq: Two times a day (BID) | ORAL | Status: DC

## 2013-03-12 MED ORDER — KETOROLAC TROMETHAMINE 30 MG/ML IJ SOLN
30.0000 mg | Freq: Once | INTRAMUSCULAR | Status: AC
  Administered 2013-03-12: 30 mg via INTRAVENOUS
  Filled 2013-03-12: qty 1

## 2013-03-12 NOTE — ED Provider Notes (Signed)
Medical screening examination/treatment/procedure(s) were performed by non-physician practitioner and as supervising physician I was immediately available for consultation/collaboration.  EKG Interpretation   None         Ardyce Heyer H Boleslaw Borghi, MD 03/12/13 1953 

## 2013-03-12 NOTE — ED Notes (Signed)
Pt's family member has just come with Jeanette Gamble for the pt to eat.  Pt advised not to eat or drink anything.
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
## 2013-03-12 NOTE — ED Notes (Signed)
Pt was sent to ED from clinic downstairs yesterday for CP/Abd pain.  Pt left after triage without being seen by EDP.  Pt did have labwork drawn.  Troponin was Negative.  Pt presents today with same complaint.  Pain is primarily under L breast.  Pt alert and oriented and in NAD.

## 2013-03-15 LAB — GC/CHLAMYDIA PROBE AMP
CT Probe RNA: NEGATIVE
GC Probe RNA: NEGATIVE

## 2013-03-22 ENCOUNTER — Ambulatory Visit: Payer: Medicaid Other | Admitting: Internal Medicine

## 2013-03-22 NOTE — Progress Notes (Signed)
I saw and evaluated the patient.  I personally confirmed the key portions of the history and exam documented by Dr. Emokpae and I reviewed pertinent patient test results.  The assessment, diagnosis, and plan were formulated together and I agree with the documentation in the resident's note. 

## 2013-03-29 ENCOUNTER — Ambulatory Visit: Payer: Medicaid Other | Admitting: Internal Medicine

## 2013-04-05 ENCOUNTER — Ambulatory Visit: Payer: Medicaid Other | Admitting: Internal Medicine

## 2013-04-12 ENCOUNTER — Ambulatory Visit (INDEPENDENT_AMBULATORY_CARE_PROVIDER_SITE_OTHER): Payer: Medicaid Other | Admitting: Internal Medicine

## 2013-04-12 ENCOUNTER — Encounter: Payer: Self-pay | Admitting: Internal Medicine

## 2013-04-12 VITALS — BP 140/100 | HR 10 | Temp 97.8°F | Ht 66.0 in | Wt 258.2 lb

## 2013-04-12 DIAGNOSIS — N760 Acute vaginitis: Secondary | ICD-10-CM

## 2013-04-12 DIAGNOSIS — IMO0001 Reserved for inherently not codable concepts without codable children: Secondary | ICD-10-CM

## 2013-04-12 DIAGNOSIS — I10 Essential (primary) hypertension: Secondary | ICD-10-CM

## 2013-04-12 DIAGNOSIS — R6889 Other general symptoms and signs: Secondary | ICD-10-CM

## 2013-04-12 MED ORDER — HYDROCHLOROTHIAZIDE 50 MG PO TABS: 50.0000 mg | ORAL_TABLET | Freq: Every day | ORAL | Status: DC

## 2013-04-12 MED ORDER — AMLODIPINE BESYLATE 10 MG PO TABS: 10.0000 mg | ORAL_TABLET | Freq: Every day | ORAL | Status: DC
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
## 2013-04-12 NOTE — Assessment & Plan Note (Signed)
BP Readings from Last 3 Encounters:  04/12/13 140/100  03/12/13 155/89  03/11/13 150/101    Lab Results  Component Value Date   NA 139 03/12/2013   K 3.7 03/12/2013   CREATININE 1.02 03/12/2013    Assessment: Blood pressure control: mildly elevated Progress toward BP goal:  deteriorated Comments: BP is better from her ED visit on 11/22 but worse from 02/2012. She is supposed be taking Amlodipine 10mg  daily and HCTZ 25mg  daily. She states that she has been unable to afford her medications recently but did pick them up a few days ago to begin taking them. She has been using the medications for the past 3-4 days.   Plan: Medications:  Continuing the Amlodipine and will increase the HCTZ to 50mg  daily. Givne her h/o of gout, I am hesistant to increase her HCTZ, as this could precipitate a gout flare, but with her medicaiton noncompliance which she attributes to cost, I have told her that she can take 2 tablets of her HCTZ 25mg  for now if she is unable to have the HCTZ 50mg  tablet prescription filled. I think that this is the best option for her at this time.  Educational resources provided:   Self management tools provided:   Other plans: F/u within 2-3 weeks for blood pressure check and for BMP. Will also need to check uric acid level.

## 2013-04-12 NOTE — Progress Notes (Signed)
Case discussed with Dr. Glenn at time of visit.  We reviewed the resident's history and exam and pertinent patient test results.  I agree with the assessment, diagnosis, and plan of care documented in the resident's note. 

## 2013-04-12 NOTE — Assessment & Plan Note (Signed)
Pt was seen in the ED on 03/12/13 with pelvic and abdominal pain. Pelvic exam + for BV and trichomonas. Pt treated with Flagyl for her BV and trichomonas and Keflex for a UTI. She was treated empirically with Rocephin and Azithro for GC/chlamydia, which eneded up being negative. No pap smear appears to have been done. She does have a h/o ASCUS per pap smear 12/2010 and is due for a repeat pap. Her young son is with her today and we are unable to perform the pap smear. Given her h/o of ASCUS, ovarian cysts, and her recent trichomonas infection, I am referring her to [facility] to GYN. I discussed the importance for the patient to be seen by the GYN physicians to be evaluated. She acknowledged understanding. If she does not see them before I see her again w/in 1 mo, I will perform the pap smear at that time.

## 2013-04-12 NOTE — Patient Instructions (Signed)
I am increasing your HCTZ to 50mg  daily from 25mg  daily. I would like for you to return to the clinic within the next few weeks for a blood pressure check.   I am referring you to Gynecology for you to have your pap smear given your history of ASCUS and your history of ovarian cysts. Please be sure to keep this appointment.

## 2013-04-12 NOTE — Assessment & Plan Note (Addendum)
Pt was seen in the ED on 03/12/13 with pelvic and abdominal pain. Pelvic exam + for BV and trichomonas. GC/chlamydia negative. No pap smear appears to have been done. She does have a h/o ASCUS per pap smear 12/2010 and is due for a repeat. Her son is with her today and we are unable to perform the pap smear. Given her h/o of ASCUS, ovarian cysts, and her recent trichomonas infection, I am referring her to [facility] to GYN. I discussed the importance for the patient to be seen by the GYN physicians to be evaluated. She acknowledged understanding. If she does not see them before I see her again w/in 1 mo, I will perform the pap smear at that time.

## 2013-04-26 ENCOUNTER — Encounter: Payer: Self-pay | Admitting: Internal Medicine

## 2013-04-26 ENCOUNTER — Ambulatory Visit: Payer: Medicaid Other | Admitting: Internal Medicine

## 2013-04-27 ENCOUNTER — Encounter: Payer: Self-pay | Admitting: Obstetrics and Gynecology

## 2013-04-27 ENCOUNTER — Ambulatory Visit (INDEPENDENT_AMBULATORY_CARE_PROVIDER_SITE_OTHER): Payer: Medicaid Other | Admitting: Internal Medicine

## 2013-04-27 ENCOUNTER — Encounter: Payer: Self-pay | Admitting: Internal Medicine

## 2013-04-27 VITALS — BP 142/91 | HR 81 | Temp 98.9°F | Wt 255.3 lb

## 2013-04-27 DIAGNOSIS — R1011 Right upper quadrant pain: Secondary | ICD-10-CM

## 2013-04-27 DIAGNOSIS — R1013 Epigastric pain: Secondary | ICD-10-CM

## 2013-04-27 DIAGNOSIS — R109 Unspecified abdominal pain: Secondary | ICD-10-CM | POA: Insufficient documentation

## 2013-04-27 LAB — CBC WITH DIFFERENTIAL/PLATELET
Basophils Absolute: 0 10*3/uL (ref 0.0–0.1)
Basophils Relative: 0 % (ref 0–1)
Eosinophils Absolute: 0.3 10*3/uL (ref 0.0–0.7)
Eosinophils Relative: 3 % (ref 0–5)
HCT: 38.8 % (ref 36.0–46.0)
Hemoglobin: 13.2 g/dL (ref 12.0–15.0)
Lymphocytes Relative: 22 % (ref 12–46)
Lymphs Abs: 2.1 10*3/uL (ref 0.7–4.0)
MCH: 29.7 pg (ref 26.0–34.0)
MCHC: 34 g/dL (ref 30.0–36.0)
MCV: 87.2 fL (ref 78.0–100.0)
Monocytes Absolute: 0.4 10*3/uL (ref 0.1–1.0)
Monocytes Relative: 4 % (ref 3–12)
Neutro Abs: 6.7 10*3/uL (ref 1.7–7.7)
Neutrophils Relative %: 71 % (ref 43–77)
Platelets: 246 10*3/uL (ref 150–400)
RBC: 4.45 MIL/uL (ref 3.87–5.11)
RDW: 14 % (ref 11.5–15.5)
WBC: 9.5 10*3/uL (ref 4.0–10.5)

## 2013-04-27 LAB — COMPREHENSIVE METABOLIC PANEL
ALT: 17 U/L (ref 0–35)
AST: 22 U/L (ref 0–37)
Albumin: 4.3 g/dL (ref 3.5–5.2)
Alkaline Phosphatase: 73 U/L (ref 39–117)
BUN: 17 mg/dL (ref 6–23)
CO2: 28 mEq/L (ref 19–32)
Calcium: 9.3 mg/dL (ref 8.4–10.5)
Chloride: 102 mEq/L (ref 96–112)
Creat: 1.03 mg/dL (ref 0.50–1.10)
Glucose, Bld: 70 mg/dL (ref 70–99)
Potassium: 3.5 mEq/L (ref 3.5–5.3)
Sodium: 137 mEq/L (ref 135–145)
Total Bilirubin: 0.9 mg/dL (ref 0.3–1.2)
Total Protein: 7.5 g/dL (ref 6.0–8.3)

## 2013-04-27 LAB — POCT URINE PREGNANCY: Preg Test, Ur: NEGATIVE

## 2013-04-27 LAB — LIPASE: Lipase: <10 U/L (ref 0–75)

## 2013-04-27 MED ORDER — PANTOPRAZOLE SODIUM 40 MG PO TBEC
40.0000 mg | DELAYED_RELEASE_TABLET | Freq: Every day | ORAL | Status: DC
Start: 2013-04-27 — End: 2013-12-08

## 2013-04-27 NOTE — Progress Notes (Signed)
Case discussed with Dr. Sharda soon after the resident saw the patient.  We reviewed the resident's history and exam and pertinent patient test results.  I agree with the assessment, diagnosis, and plan of care documented in the resident's note. 
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
## 2013-04-27 NOTE — Assessment & Plan Note (Addendum)
Epigastric, RUQ and suprapubic  Broad differential - pancreatits, PUD, nephrolithiasis, UTI; pregnancy less likely; if all organic etiology ruled out, may be psychogenic given recent stress.  Plan: CMET, CBC, Lipase, urine hcg, UA/micro Pt to call early next week if no improvement, will order abd [place] then (sooner if labs suggest biliary obstruction or pancreatitis) Start trial of protonix 40mg  daily Follow up in clinic in 2 weeks Note given for her to return to work on 05/02/13, sooner if symptoms resolve

## 2013-04-27 NOTE — Patient Instructions (Signed)
-  Stay hydrated! Start taking protonix 40mg  daily.  I will let you know if we need imaging sooner than later.  If you still have symptoms early next week, please call clinic and leave a message for me.   Please be sure to bring all of your medications with you to every visit.  Should you have any new or worsening symptoms, please be sure to call the clinic at [phone].

## 2013-04-28 LAB — URINALYSIS, ROUTINE W REFLEX MICROSCOPIC
Bilirubin Urine: NEGATIVE
Glucose, UA: NEGATIVE mg/dL
Leukocytes, UA: NEGATIVE
Nitrite: NEGATIVE
Protein, ur: NEGATIVE mg/dL
Specific Gravity, Urine: 1.026 (ref 1.005–1.030)
Urobilinogen, UA: 0.2 mg/dL (ref 0.0–1.0)
pH: 5.5 (ref 5.0–8.0)

## 2013-04-28 LAB — URINALYSIS, MICROSCOPIC ONLY
Casts: NONE SEEN
Crystals: NONE SEEN

## 2013-05-18 ENCOUNTER — Other Ambulatory Visit: Payer: Self-pay | Admitting: Ophthalmology

## 2013-05-18 DIAGNOSIS — H5340 Unspecified visual field defects: Secondary | ICD-10-CM

## 2013-05-18 DIAGNOSIS — H4720 Unspecified optic atrophy: Secondary | ICD-10-CM

## 2013-05-23 ENCOUNTER — Other Ambulatory Visit: Payer: Self-pay | Admitting: Ophthalmology

## 2013-05-23 DIAGNOSIS — H5340 Unspecified visual field defects: Secondary | ICD-10-CM

## 2013-05-23 DIAGNOSIS — H4720 Unspecified optic atrophy: Secondary | ICD-10-CM

## 2013-05-24 ENCOUNTER — Inpatient Hospital Stay
Admission: RE | Admit: 2013-05-24 | Discharge: 2013-05-24 | Disposition: A | Discharge status: Home / Self Care | Payer: Medicaid Other | Source: Ambulatory Visit | Attending: Ophthalmology | Admitting: Ophthalmology

## 2013-05-24 ENCOUNTER — Other Ambulatory Visit: Payer: Medicaid Other

## 2013-06-01 ENCOUNTER — Ambulatory Visit (INDEPENDENT_AMBULATORY_CARE_PROVIDER_SITE_OTHER): Payer: Medicaid Other | Admitting: Obstetrics and Gynecology

## 2013-06-01 ENCOUNTER — Other Ambulatory Visit (HOSPITAL_COMMUNITY)
Admission: RE | Admit: 2013-06-01 | Discharge: 2013-06-01 | Disposition: A | Discharge status: Home / Self Care | Payer: Medicaid Other | Source: Ambulatory Visit | Attending: Obstetrics and Gynecology | Admitting: Obstetrics and Gynecology

## 2013-06-01 ENCOUNTER — Encounter: Payer: Self-pay | Admitting: Obstetrics and Gynecology

## 2013-06-01 VITALS — BP 156/112 | HR 96 | Temp 98.6°F | Resp 20 | Ht 64.5 in | Wt 265.2 lb

## 2013-06-01 DIAGNOSIS — Z01419 Encounter for gynecological examination (general) (routine) without abnormal findings: Secondary | ICD-10-CM | POA: Insufficient documentation

## 2013-06-01 DIAGNOSIS — Z01812 Encounter for preprocedural laboratory examination: Secondary | ICD-10-CM

## 2013-06-01 DIAGNOSIS — Z1151 Encounter for screening for human papillomavirus (HPV): Secondary | ICD-10-CM | POA: Insufficient documentation

## 2013-06-01 DIAGNOSIS — Z3009 Encounter for other general counseling and advice on contraception: Secondary | ICD-10-CM

## 2013-06-01 DIAGNOSIS — Z124 Encounter for screening for malignant neoplasm of cervix: Secondary | ICD-10-CM

## 2013-06-01 LAB — POCT PREGNANCY, URINE
Preg Test, Ur: NEGATIVE
Preg Test, Ur: NEGATIVE

## 2013-06-01 NOTE — Progress Notes (Signed)
Patient ID: Jeanette Gamble, female   DOB: 1980-07-04, 33 y.o.   MRN: [id number] 33 yo with h/o ASCUS pap in 2012 referred here for cervical cancer screening. Patient states she has not had a pap smear since 2012.  GENERAL: Well-developed, well-nourished female in no acute distress. Obses ABDOMEN: Soft, nontender, nondistended. Obses PELVIC: Normal external female genitalia. Vagina is pink and rugated.  Normal discharge. Normal appearing cervix. Bimanual exam limited by body habitus. No adnexal mass or tenderness. EXTREMITIES: No cyanosis, clubbing, or edema, 2+ distal pulses.  A/P 33 yo here for cervical cancer screening - pap smear collected - patient will be contacted with any abnormal results - RTC prn

## 2013-06-01 NOTE — Progress Notes (Signed)
UPT:negative

## 2013-06-04 ENCOUNTER — Emergency Department (INDEPENDENT_AMBULATORY_CARE_PROVIDER_SITE_OTHER)
Admission: EM | Admit: 2013-06-04 | Discharge: 2013-06-04 | Disposition: A | Discharge status: Home / Self Care | Payer: Medicaid Other | Source: Home / Self Care

## 2013-06-04 ENCOUNTER — Encounter (HOSPITAL_COMMUNITY): Payer: Self-pay | Admitting: Emergency Medicine

## 2013-06-04 DIAGNOSIS — J069 Acute upper respiratory infection, unspecified: Secondary | ICD-10-CM

## 2013-06-04 LAB — POCT URINALYSIS DIP (DEVICE)
Bilirubin Urine: NEGATIVE
Glucose, UA: NEGATIVE mg/dL
Ketones, ur: NEGATIVE mg/dL
Leukocytes, UA: NEGATIVE
Nitrite: NEGATIVE
Protein, ur: NEGATIVE mg/dL
Specific Gravity, Urine: 1.02 (ref 1.005–1.030)
Urobilinogen, UA: 0.2 mg/dL (ref 0.0–1.0)
pH: 6 (ref 5.0–8.0)

## 2013-06-04 LAB — POCT PREGNANCY, URINE: Preg Test, Ur: NEGATIVE

## 2013-06-04 MED ORDER — IPRATROPIUM BROMIDE 0.06 % NA SOLN: 2.0000 | Freq: Four times a day (QID) | NASAL | Status: DC

## 2013-06-04 MED ORDER — DEXTROMETHORPHAN POLISTIREX 30 MG/5ML PO LQCR
60.0000 mg | Freq: Two times a day (BID) | ORAL | Status: DC
Start: 2013-06-04 — End: 2013-12-08

## 2013-06-04 NOTE — ED Notes (Signed)
Abdominal pain x 1 year. Sore throat new since yesterday

## 2013-06-04 NOTE — Discharge Instructions (Signed)
Take all of medicine, drink lots of fluids, no more smoking, see your doctor if further problems  °
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
## 2013-07-21 ENCOUNTER — Encounter: Payer: Self-pay | Admitting: Internal Medicine

## 2013-07-21 DIAGNOSIS — R102 Pelvic and perineal pain: Secondary | ICD-10-CM | POA: Insufficient documentation

## 2013-12-08 ENCOUNTER — Emergency Department (HOSPITAL_COMMUNITY): Payer: Worker's Compensation

## 2013-12-08 ENCOUNTER — Encounter (HOSPITAL_COMMUNITY): Payer: Self-pay | Admitting: Emergency Medicine

## 2013-12-08 ENCOUNTER — Emergency Department (HOSPITAL_COMMUNITY)
Admission: EM | Admit: 2013-12-08 | Discharge: 2013-12-08 | Disposition: A | Discharge status: Home / Self Care | Payer: Worker's Compensation | Attending: Emergency Medicine | Admitting: Emergency Medicine

## 2013-12-08 DIAGNOSIS — Z8742 Personal history of other diseases of the female genital tract: Secondary | ICD-10-CM | POA: Diagnosis not present

## 2013-12-08 DIAGNOSIS — W010XXA Fall on same level from slipping, tripping and stumbling without subsequent striking against object, initial encounter: Secondary | ICD-10-CM | POA: Insufficient documentation

## 2013-12-08 DIAGNOSIS — S6990XA Unspecified injury of unspecified wrist, hand and finger(s), initial encounter: Secondary | ICD-10-CM

## 2013-12-08 DIAGNOSIS — S298XXA Other specified injuries of thorax, initial encounter: Secondary | ICD-10-CM | POA: Insufficient documentation

## 2013-12-08 DIAGNOSIS — Z8639 Personal history of other endocrine, nutritional and metabolic disease: Secondary | ICD-10-CM | POA: Insufficient documentation

## 2013-12-08 DIAGNOSIS — I10 Essential (primary) hypertension: Secondary | ICD-10-CM | POA: Diagnosis not present

## 2013-12-08 DIAGNOSIS — Z8619 Personal history of other infectious and parasitic diseases: Secondary | ICD-10-CM | POA: Diagnosis not present

## 2013-12-08 DIAGNOSIS — S6992XA Unspecified injury of left wrist, hand and finger(s), initial encounter: Secondary | ICD-10-CM

## 2013-12-08 DIAGNOSIS — S59919A Unspecified injury of unspecified forearm, initial encounter: Secondary | ICD-10-CM

## 2013-12-08 DIAGNOSIS — S59909A Unspecified injury of unspecified elbow, initial encounter: Secondary | ICD-10-CM | POA: Insufficient documentation

## 2013-12-08 DIAGNOSIS — W19XXXA Unspecified fall, initial encounter: Secondary | ICD-10-CM

## 2013-12-08 DIAGNOSIS — Y9389 Activity, other specified: Secondary | ICD-10-CM | POA: Insufficient documentation

## 2013-12-08 DIAGNOSIS — G43909 Migraine, unspecified, not intractable, without status migrainosus: Secondary | ICD-10-CM | POA: Insufficient documentation

## 2013-12-08 DIAGNOSIS — S46909A Unspecified injury of unspecified muscle, fascia and tendon at shoulder and upper arm level, unspecified arm, initial encounter: Secondary | ICD-10-CM | POA: Insufficient documentation

## 2013-12-08 DIAGNOSIS — Z87828 Personal history of other (healed) physical injury and trauma: Secondary | ICD-10-CM | POA: Insufficient documentation

## 2013-12-08 DIAGNOSIS — S4992XA Unspecified injury of left shoulder and upper arm, initial encounter: Secondary | ICD-10-CM

## 2013-12-08 DIAGNOSIS — Y9289 Other specified places as the place of occurrence of the external cause: Secondary | ICD-10-CM | POA: Insufficient documentation

## 2013-12-08 DIAGNOSIS — Z79899 Other long term (current) drug therapy: Secondary | ICD-10-CM | POA: Insufficient documentation

## 2013-12-08 DIAGNOSIS — S4980XA Other specified injuries of shoulder and upper arm, unspecified arm, initial encounter: Secondary | ICD-10-CM | POA: Insufficient documentation

## 2013-12-08 DIAGNOSIS — F17200 Nicotine dependence, unspecified, uncomplicated: Secondary | ICD-10-CM | POA: Diagnosis not present

## 2013-12-08 DIAGNOSIS — Z862 Personal history of diseases of the blood and blood-forming organs and certain disorders involving the immune mechanism: Secondary | ICD-10-CM | POA: Diagnosis not present

## 2013-12-08 DIAGNOSIS — R0789 Other chest pain: Secondary | ICD-10-CM

## 2013-12-08 MED ORDER — IBUPROFEN 800 MG PO TABS: 800.0000 mg | ORAL_TABLET | Freq: Three times a day (TID) | ORAL | Status: DC

## 2013-12-08 MED ORDER — IBUPROFEN 400 MG PO TABS
400.0000 mg | ORAL_TABLET | Freq: Once | ORAL | Status: AC
  Administered 2013-12-08: 400 mg via ORAL
  Filled 2013-12-08: qty 1

## 2013-12-08 NOTE — Discharge Instructions (Signed)
Take Ibuprofen as needed for pain. Refer to attached documents for more information. Follow up with your doctor for further evaluation as needed.
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
## 2013-12-08 NOTE — ED Provider Notes (Signed)
4:38 PM Patient signed out to me by Monico Blitz, PA-C. Patient's xrays unremarkable for acute changes. Patient will be discharged with motrin. Vitals stable and patient afebrile.   Alvina Chou, PA-C 12/08/13 2227

## 2013-12-08 NOTE — ED Notes (Signed)
Per pt sts she slipped on a wet floor at work and fell on her left side. sts her whole left side is hurting. sts wrist, arm, shoulder, leg, back, neck.

## 2013-12-13 NOTE — ED Provider Notes (Signed)
Medical screening examination/treatment/procedure(s) were performed by non-physician practitioner and as supervising physician I was immediately available for consultation/collaboration.   EKG Interpretation None       Virgel Manifold, MD 12/13/13 [phone]

## 2013-12-21 ENCOUNTER — Ambulatory Visit (INDEPENDENT_AMBULATORY_CARE_PROVIDER_SITE_OTHER): Payer: Self-pay | Admitting: Internal Medicine

## 2013-12-21 VITALS — BP 144/106 | HR 58 | Temp 98.1°F | Wt 260.9 lb

## 2013-12-21 DIAGNOSIS — Z5189 Encounter for other specified aftercare: Secondary | ICD-10-CM

## 2013-12-21 DIAGNOSIS — I10 Essential (primary) hypertension: Secondary | ICD-10-CM

## 2013-12-21 DIAGNOSIS — Z0000 Encounter for general adult medical examination without abnormal findings: Secondary | ICD-10-CM

## 2013-12-21 DIAGNOSIS — W010XXD Fall on same level from slipping, tripping and stumbling without subsequent striking against object, subsequent encounter: Secondary | ICD-10-CM

## 2013-12-21 DIAGNOSIS — W010XXA Fall on same level from slipping, tripping and stumbling without subsequent striking against object, initial encounter: Secondary | ICD-10-CM | POA: Insufficient documentation

## 2013-12-21 NOTE — Progress Notes (Signed)
Case discussed with Dr. Glenn soon after the resident saw the patient.  We reviewed the resident's history and exam and pertinent patient test results.  I agree with the assessment, diagnosis, and plan of care documented in the resident's note. 

## 2013-12-21 NOTE — Assessment & Plan Note (Signed)
Slipped at work. Imaging all negative for fracture. Still with pain in left biceps and in left wrist. ROM intact. Swelling present in 3rd PIP with some tenderness to palpation. Suspect possible sprain in finger. Pt has ACE bandage, encouraged continued use vs wrist brace. Pt advised to rest left hand and try antiinflammatories. She was prescribed Ibuprofen in the ED but never picked up the Rx. - Continue with ACE bandage or wrist brace - Ibuprofen 800mg  q8h PRN pain (rx provided by ED 8/20) - Return to clinic if pain and swelling not better over the next 2 weeks.

## 2013-12-21 NOTE — Assessment & Plan Note (Signed)
BP Readings from Last 3 Encounters:  12/21/13 144/106  12/08/13 147/104  06/04/13 140/103    Lab Results  Component Value Date   NA 137 04/27/2013   K 3.5 04/27/2013   CREATININE 1.03 04/27/2013    Assessment: Blood pressure control: mildly elevated Progress toward BP goal:  unchanged Comments: Pt with pain in her left arm and wrist s/p fall on 8/20. She is not taking any medication for pain.   Plan: Medications:  continue current medications Educational resources provided:   Self management tools provided:   Other plans: F/u in 1-2 months for BP check and will check BMP at that time to assess her renal function and electrolytes.
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
## 2013-12-21 NOTE — Patient Instructions (Signed)

## 2013-12-21 NOTE — Assessment & Plan Note (Signed)
Flu vaccine once pt has insurance. Pt also due for lipid panel.

## 2013-12-26 NOTE — ED Provider Notes (Signed)
Medical screening examination/treatment/procedure(s) were performed by non-physician practitioner and as supervising physician I was immediately available for consultation/collaboration.   EKG Interpretation None       Virgel Manifold, MD 12/26/13 [phone]

## 2014-01-19 ENCOUNTER — Encounter (HOSPITAL_COMMUNITY): Payer: Self-pay | Admitting: Emergency Medicine

## 2014-01-19 ENCOUNTER — Emergency Department (HOSPITAL_COMMUNITY)
Admission: EM | Admit: 2014-01-19 | Discharge: 2014-01-19 | Disposition: A | Discharge status: Home / Self Care | Payer: Self-pay | Attending: Emergency Medicine | Admitting: Emergency Medicine

## 2014-01-19 DIAGNOSIS — M109 Gout, unspecified: Secondary | ICD-10-CM | POA: Insufficient documentation

## 2014-01-19 DIAGNOSIS — Z720 Tobacco use: Secondary | ICD-10-CM | POA: Insufficient documentation

## 2014-01-19 DIAGNOSIS — K088 Other specified disorders of teeth and supporting structures: Secondary | ICD-10-CM | POA: Insufficient documentation

## 2014-01-19 DIAGNOSIS — Z8742 Personal history of other diseases of the female genital tract: Secondary | ICD-10-CM | POA: Insufficient documentation

## 2014-01-19 DIAGNOSIS — Z79899 Other long term (current) drug therapy: Secondary | ICD-10-CM | POA: Insufficient documentation

## 2014-01-19 DIAGNOSIS — Z7982 Long term (current) use of aspirin: Secondary | ICD-10-CM | POA: Insufficient documentation

## 2014-01-19 DIAGNOSIS — K0889 Other specified disorders of teeth and supporting structures: Secondary | ICD-10-CM

## 2014-01-19 DIAGNOSIS — G43909 Migraine, unspecified, not intractable, without status migrainosus: Secondary | ICD-10-CM | POA: Insufficient documentation

## 2014-01-19 DIAGNOSIS — Z8619 Personal history of other infectious and parasitic diseases: Secondary | ICD-10-CM | POA: Insufficient documentation

## 2014-01-19 DIAGNOSIS — Z791 Long term (current) use of non-steroidal anti-inflammatories (NSAID): Secondary | ICD-10-CM | POA: Insufficient documentation

## 2014-01-19 DIAGNOSIS — I10 Essential (primary) hypertension: Secondary | ICD-10-CM | POA: Insufficient documentation

## 2014-01-19 MED ORDER — HYDROCODONE-ACETAMINOPHEN 5-325 MG PO TABS: 2.0000 | ORAL_TABLET | ORAL | Status: DC | PRN
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
## 2014-01-19 NOTE — Discharge Instructions (Signed)

## 2014-01-19 NOTE — ED Notes (Signed)
Pt reports dental pain started 2 days ago . Pain is now 10/10 .

## 2014-01-19 NOTE — ED Notes (Signed)
Declined W/C at D/C and was escorted to lobby by RN. 

## 2014-01-20 NOTE — ED Provider Notes (Signed)
Medical screening examination/treatment/procedure(s) were performed by non-physician practitioner and as supervising physician I was immediately available for consultation/collaboration.   EKG Interpretation None        Orpah Greek, MD 01/20/14 [phone]

## 2014-02-20 ENCOUNTER — Encounter (HOSPITAL_COMMUNITY): Payer: Self-pay | Admitting: Emergency Medicine

## 2014-02-23 ENCOUNTER — Encounter: Payer: Self-pay | Admitting: Internal Medicine

## 2014-06-17 ENCOUNTER — Other Ambulatory Visit: Payer: Self-pay | Admitting: Internal Medicine

## 2014-08-03 ENCOUNTER — Other Ambulatory Visit: Payer: Self-pay | Admitting: Internal Medicine

## 2014-08-08 ENCOUNTER — Encounter: Payer: Self-pay | Admitting: Internal Medicine

## 2014-09-05 ENCOUNTER — Encounter: Payer: Self-pay | Admitting: *Deleted

## 2014-10-24 ENCOUNTER — Ambulatory Visit (INDEPENDENT_AMBULATORY_CARE_PROVIDER_SITE_OTHER): Payer: Self-pay | Admitting: Internal Medicine

## 2014-10-24 ENCOUNTER — Encounter: Payer: Self-pay | Admitting: Internal Medicine

## 2014-10-24 VITALS — BP 180/120 | HR 64 | Temp 98.0°F | Ht 66.0 in | Wt 253.7 lb

## 2014-10-24 DIAGNOSIS — K029 Dental caries, unspecified: Secondary | ICD-10-CM | POA: Insufficient documentation

## 2014-10-24 DIAGNOSIS — I10 Essential (primary) hypertension: Secondary | ICD-10-CM

## 2014-10-24 DIAGNOSIS — Z6841 Body Mass Index (BMI) 40.0 and over, adult: Secondary | ICD-10-CM

## 2014-10-24 DIAGNOSIS — M79604 Pain in right leg: Secondary | ICD-10-CM

## 2014-10-24 DIAGNOSIS — F17210 Nicotine dependence, cigarettes, uncomplicated: Secondary | ICD-10-CM

## 2014-10-24 LAB — BASIC METABOLIC PANEL WITH GFR
BUN: 14 mg/dL (ref 6–23)
CO2: 26 mEq/L (ref 19–32)
Calcium: 8.7 mg/dL (ref 8.4–10.5)
Chloride: 107 mEq/L (ref 96–112)
Creat: 1.09 mg/dL (ref 0.50–1.10)
GFR, Est African American: 77 mL/min
GFR, Est Non African American: 66 mL/min
Glucose, Bld: 78 mg/dL (ref 70–99)
Potassium: 4 mEq/L (ref 3.5–5.3)
Sodium: 139 mEq/L (ref 135–145)

## 2014-10-24 LAB — POCT GLYCOSYLATED HEMOGLOBIN (HGB A1C): Hemoglobin A1C: 4.9

## 2014-10-24 LAB — GLUCOSE, CAPILLARY: Glucose-Capillary: 77 mg/dL (ref 65–99)

## 2014-10-24 MED ORDER — LISINOPRIL-HYDROCHLOROTHIAZIDE 20-25 MG PO TABS: 1.0000 | ORAL_TABLET | Freq: Every day | ORAL | Status: DC

## 2014-10-24 MED ORDER — AMLODIPINE BESYLATE 10 MG PO TABS: 10.0000 mg | ORAL_TABLET | Freq: Every day | ORAL | Status: DC

## 2014-10-24 NOTE — Assessment & Plan Note (Addendum)
BP Readings from Last 3 Encounters:  10/24/14 180/120  01/19/14 158/120  12/21/13 144/106    Lab Results  Component Value Date   NA 137 04/27/2013   K 3.5 04/27/2013   CREATININE 1.03 04/27/2013    Assessment & Plan: Hypertension- uncontrolled since being off meds. Her blood pressure was checked twice in the office here and both times it was high.  We restarted her amlodipine 10 mg qd Also started prinzide (lisinopril/hctz) 20-25 qd BMP ordered to check renal function She was instructed to come back to clinic or go to ER if her headache acutely worsens or she has any new concerning symptoms    Blood pressure control: severely elevated Progress toward BP goal:  deteriorated Comments:
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
## 2014-10-24 NOTE — Patient Instructions (Signed)
Thank you for your visit. Please take your medicines as prescribed for your blood pressure. Please call or return to clinic if your symptoms worsen. Please see Ms. Deb Hill to receive your orange card  All of your medicines are sent to the [place] at [facility]  We will refer you to the dental clinic.

## 2014-10-24 NOTE — Assessment & Plan Note (Signed)
Assessment: morbid obesity, BMI >40.               Dietary and lifestyle modification instructions given. Patient has been trying to lose weight and lost about 6-7 pounds We ordered her Hb A1C At the next visit , we will talk about lipid management and the A1c results

## 2014-10-26 NOTE — Progress Notes (Signed)
Internal Medicine Clinic Attending  I saw and evaluated the patient.  I personally confirmed the key portions of the history and exam documented by Dr. Saraiya and I reviewed pertinent patient test results.  The assessment, diagnosis, and plan were formulated together and I agree with the documentation in the resident's note.  

## 2014-11-06 ENCOUNTER — Telehealth: Payer: Self-pay | Admitting: Internal Medicine

## 2014-11-06 NOTE — Telephone Encounter (Signed)
Call to patient to confirm appointment for 11/07/14 at 9:15 lmtcb

## 2014-11-07 ENCOUNTER — Ambulatory Visit: Payer: Self-pay | Admitting: Internal Medicine

## 2014-12-21 ENCOUNTER — Ambulatory Visit: Payer: Self-pay

## 2015-01-25 ENCOUNTER — Other Ambulatory Visit: Payer: Self-pay | Admitting: Internal Medicine

## 2015-02-16 ENCOUNTER — Other Ambulatory Visit: Payer: Self-pay | Admitting: Internal Medicine

## 2015-02-16 DIAGNOSIS — I10 Essential (primary) hypertension: Secondary | ICD-10-CM

## 2015-05-17 ENCOUNTER — Telehealth: Payer: Self-pay | Admitting: Internal Medicine

## 2015-05-17 NOTE — Telephone Encounter (Signed)
Call to patient to confirm appointment for 05/18/15 at 1:15 lmtcb

## 2015-05-18 ENCOUNTER — Encounter: Payer: Self-pay | Admitting: Internal Medicine

## 2015-05-24 ENCOUNTER — Telehealth: Payer: Self-pay | Admitting: Internal Medicine

## 2015-05-24 NOTE — Telephone Encounter (Signed)
Call to patient to confirm appointment for 05/25/15 at 1:45 lmtcb

## 2015-05-25 ENCOUNTER — Encounter: Payer: Self-pay | Admitting: Internal Medicine

## 2015-06-01 ENCOUNTER — Encounter: Payer: Self-pay | Admitting: Pharmacist

## 2015-06-01 ENCOUNTER — Ambulatory Visit (HOSPITAL_COMMUNITY)
Admission: RE | Admit: 2015-06-01 | Discharge: 2015-06-01 | Disposition: A | Discharge status: Home / Self Care | Payer: Medicaid Other | Source: Ambulatory Visit | Attending: Internal Medicine | Admitting: Internal Medicine

## 2015-06-01 ENCOUNTER — Ambulatory Visit (INDEPENDENT_AMBULATORY_CARE_PROVIDER_SITE_OTHER): Payer: Medicaid Other | Admitting: Internal Medicine

## 2015-06-01 ENCOUNTER — Encounter: Payer: Self-pay | Admitting: Internal Medicine

## 2015-06-01 VITALS — BP 181/123 | HR 80 | Temp 97.4°F | Ht 66.0 in | Wt 262.7 lb

## 2015-06-01 DIAGNOSIS — W19XXXA Unspecified fall, initial encounter: Secondary | ICD-10-CM

## 2015-06-01 DIAGNOSIS — I10 Essential (primary) hypertension: Secondary | ICD-10-CM

## 2015-06-01 DIAGNOSIS — M79605 Pain in left leg: Secondary | ICD-10-CM | POA: Diagnosis not present

## 2015-06-01 DIAGNOSIS — W1789XA Other fall from one level to another, initial encounter: Secondary | ICD-10-CM | POA: Diagnosis not present

## 2015-06-01 DIAGNOSIS — M545 Low back pain: Secondary | ICD-10-CM | POA: Insufficient documentation

## 2015-06-01 DIAGNOSIS — M546 Pain in thoracic spine: Secondary | ICD-10-CM

## 2015-06-01 MED ORDER — LISINOPRIL-HYDROCHLOROTHIAZIDE 20-25 MG PO TABS: 1.0000 | ORAL_TABLET | Freq: Every day | ORAL | Status: DC

## 2015-06-01 MED ORDER — AMLODIPINE BESYLATE 10 MG PO TABS: 10.0000 mg | ORAL_TABLET | Freq: Every day | ORAL | Status: DC

## 2015-06-01 NOTE — Assessment & Plan Note (Signed)
Patient has not been on any medications for the last month, as she has not been working.  BP Readings from Last 3 Encounters:  06/01/15 181/123  10/24/14 180/120  01/19/14 158/120    Lab Results  Component Value Date   NA 139 10/24/2014   K 4.0 10/24/2014   CREATININE 1.09 10/24/2014    Assessment: Blood pressure control:  poor, uncontrolled Progress toward BP goal:    Comments: patient has not been taking her medications for one month  Plan: Medications:  restart Amlodipine 10 mg and Lisinopril-HCTZ Educational resources provided: handout Self management tools provided:   Other plans: BMP today and recheck 1 month
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
## 2015-06-01 NOTE — Addendum Note (Signed)
Addended by: Forde Dandy on: 06/01/2015 03:59 PM   Modules accepted: Orders

## 2015-06-01 NOTE — Patient Instructions (Addendum)
1. Restart your blood pressure medications. 2. We will get Xrays of spine. 3. Take Ibuprofen 600 mg (3 tabs) three times daily OR Aleve 440 mg (2 tabs) twice daily. Do not combine Ibuprofen/Advil and Aleve. 4. Return to clinic in 1 month.  Back Pain, Adult Back pain is very common in adults.The cause of back pain is rarely dangerous and the pain often gets better over time.The cause of your back pain may not be known. Some common causes of back pain include:  Strain of the muscles or ligaments supporting the spine.  Wear and tear (degeneration) of the spinal disks.  Arthritis.  Direct injury to the back. For many people, back pain may return. Since back pain is rarely dangerous, most people can learn to manage this condition on their own. HOME CARE INSTRUCTIONS Watch your back pain for any changes. The following actions may help to lessen any discomfort you are feeling:  Remain active. It is stressful on your back to sit or stand in one place for long periods of time. Do not sit, drive, or stand in one place for more than 30 minutes at a time. Take short walks on even surfaces as soon as you are able.Try to increase the length of time you walk each day.  Exercise regularly as directed by your health care provider. Exercise helps your back heal faster. It also helps avoid future injury by keeping your muscles strong and flexible.  Do not stay in bed.Resting more than 1-2 days can delay your recovery.  Pay attention to your body when you bend and lift. The most comfortable positions are those that put less stress on your recovering back. Always use proper lifting techniques, including:  Bending your knees.  Keeping the load close to your body.  Avoiding twisting.  Find a comfortable position to sleep. Use a firm mattress and lie on your side with your knees slightly bent. If you lie on your back, put a pillow under your knees.  Avoid feeling anxious or stressed.Stress increases  muscle tension and can worsen back pain.It is important to recognize when you are anxious or stressed and learn ways to manage it, such as with exercise.  Take medicines only as directed by your health care provider. Over-the-counter medicines to reduce pain and inflammation are often the most helpful.Your health care provider may prescribe muscle relaxant drugs.These medicines help dull your pain so you can more quickly return to your normal activities and healthy exercise.  Apply ice to the injured area:  Put ice in a plastic bag.  Place a towel between your skin and the bag.  Leave the ice on for 20 minutes, 2-3 times a day for the first 2-3 days. After that, ice and heat may be alternated to reduce pain and spasms.  Maintain a healthy weight. Excess weight puts extra stress on your back and makes it difficult to maintain good posture. SEEK MEDICAL CARE IF:  You have pain that is not relieved with rest or medicine.  You have increasing pain going down into the legs or buttocks.  You have pain that does not improve in one week.  You have night pain.  You lose weight.  You have a fever or chills. SEEK IMMEDIATE MEDICAL CARE IF:   You develop new bowel or bladder control problems.  You have unusual weakness or numbness in your arms or legs.  You develop nausea or vomiting.  You develop abdominal pain.  You feel faint.   This  information is not intended to replace advice given to you by your health care provider. Make sure you discuss any questions you have with your health care provider.   Document Released: 04/07/2005 Document Revised: 04/28/2014 Document Reviewed: 08/09/2013 Elsevier Interactive Patient Education [employer].
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
## 2015-06-02 LAB — BMP8+ANION GAP
Anion Gap: 17 mmol/L (ref 10.0–18.0)
BUN/Creatinine Ratio: 15 (ref 8–20)
BUN: 17 mg/dL (ref 6–20)
CO2: 22 mmol/L (ref 18–29)
Calcium: 8.6 mg/dL — ABNORMAL LOW (ref 8.7–10.2)
Chloride: 103 mmol/L (ref 96–106)
Creatinine, Ser: 1.12 mg/dL — ABNORMAL HIGH (ref 0.57–1.00)
GFR calc Af Amer: 74 mL/min/{1.73_m2} (ref >59)
GFR calc non Af Amer: 64 mL/min/{1.73_m2} (ref >59)
Glucose: 109 mg/dL — ABNORMAL HIGH (ref 65–99)
Potassium: 4.7 mmol/L (ref 3.5–5.2)
Sodium: 142 mmol/L (ref 134–144)

## 2015-06-04 ENCOUNTER — Ambulatory Visit: Payer: Self-pay | Admitting: Pharmacist

## 2015-06-09 NOTE — Progress Notes (Signed)
Internal Medicine Clinic Attending  Case discussed with Dr. Taylor at the time of the visit.  We reviewed the resident's history and exam and pertinent patient test results.  I agree with the assessment, diagnosis, and plan of care documented in the resident's note. 

## 2015-06-15 ENCOUNTER — Ambulatory Visit: Payer: Self-pay | Admitting: Pharmacist

## 2015-06-15 ENCOUNTER — Encounter: Payer: Self-pay | Admitting: Dietician

## 2015-06-15 NOTE — Progress Notes (Signed)
Patient ID: Jeanette Gamble, female   DOB: [birth year], 35 y.o.   MRN: [id number]  Patient requested help with medications. Collaborated with [facility] outpatient pharmacy to fill amlodipine and lisinopril-HCTZ for patient. Continuing to work with team going forward to assist with HTN management.

## 2015-06-28 ENCOUNTER — Telehealth: Payer: Self-pay | Admitting: Internal Medicine

## 2015-06-28 NOTE — Telephone Encounter (Signed)
APPT. REMINDER, NO ANSWER NO VOICE MAIL

## 2015-06-29 ENCOUNTER — Ambulatory Visit: Payer: Self-pay | Admitting: Pharmacist

## 2015-06-29 ENCOUNTER — Encounter: Payer: Self-pay | Admitting: Internal Medicine

## 2015-07-13 NOTE — Addendum Note (Signed)
Addended by: Hulan Fray on: 07/13/2015 01:38 PM   Modules accepted: Orders

## 2015-07-17 ENCOUNTER — Emergency Department (HOSPITAL_COMMUNITY)
Admission: EM | Admit: 2015-07-17 | Discharge: 2015-07-17 | Disposition: A | Discharge status: Home / Self Care | Payer: Medicaid Other | Attending: Emergency Medicine | Admitting: Emergency Medicine

## 2015-07-17 ENCOUNTER — Encounter (HOSPITAL_COMMUNITY): Payer: Self-pay | Admitting: Emergency Medicine

## 2015-07-17 DIAGNOSIS — R112 Nausea with vomiting, unspecified: Secondary | ICD-10-CM | POA: Diagnosis present

## 2015-07-17 DIAGNOSIS — I10 Essential (primary) hypertension: Secondary | ICD-10-CM | POA: Insufficient documentation

## 2015-07-17 DIAGNOSIS — R109 Unspecified abdominal pain: Secondary | ICD-10-CM | POA: Insufficient documentation

## 2015-07-17 DIAGNOSIS — R197 Diarrhea, unspecified: Secondary | ICD-10-CM | POA: Diagnosis not present

## 2015-07-17 LAB — COMPREHENSIVE METABOLIC PANEL
ALT: 26 U/L (ref 14–54)
AST: 23 U/L (ref 15–41)
Albumin: 3.4 g/dL — ABNORMAL LOW (ref 3.5–5.0)
Alkaline Phosphatase: 59 U/L (ref 38–126)
Anion gap: 10 (ref 5–15)
BUN: 12 mg/dL (ref 6–20)
CO2: 22 mmol/L (ref 22–32)
Calcium: 8.3 mg/dL — ABNORMAL LOW (ref 8.9–10.3)
Chloride: 106 mmol/L (ref 101–111)
Creatinine, Ser: 1.05 mg/dL — ABNORMAL HIGH (ref 0.44–1.00)
GFR calc Af Amer: >60 mL/min (ref >60)
GFR calc non Af Amer: >60 mL/min (ref >60)
Glucose, Bld: 109 mg/dL — ABNORMAL HIGH (ref 65–99)
Potassium: 3.1 mmol/L — ABNORMAL LOW (ref 3.5–5.1)
Sodium: 138 mmol/L (ref 135–145)
Total Bilirubin: 0.8 mg/dL (ref 0.3–1.2)
Total Protein: 6.6 g/dL (ref 6.5–8.1)

## 2015-07-17 LAB — CBC
HCT: 34.5 % — ABNORMAL LOW (ref 36.0–46.0)
Hemoglobin: 11.3 g/dL — ABNORMAL LOW (ref 12.0–15.0)
MCH: 28.5 pg (ref 26.0–34.0)
MCHC: 32.8 g/dL (ref 30.0–36.0)
MCV: 86.9 fL (ref 78.0–100.0)
Platelets: 192 10*3/uL (ref 150–400)
RBC: 3.97 MIL/uL (ref 3.87–5.11)
RDW: 13.4 % (ref 11.5–15.5)
WBC: 9.3 10*3/uL (ref 4.0–10.5)

## 2015-07-17 LAB — I-STAT BETA HCG BLOOD, ED (MC, WL, AP ONLY): I-stat hCG, quantitative: <5 m[IU]/mL (ref <5)

## 2015-07-17 LAB — LIPASE, BLOOD: Lipase: 21 U/L (ref 11–51)

## 2015-07-17 NOTE — ED Notes (Signed)
Pt c/o n/V/D since last night. Ten times vomiting/diarrhea since last night. Pt in NAD at this time. VSS

## 2015-07-17 NOTE — ED Notes (Signed)
Called in waiting area for reassessment, no answer

## 2015-07-17 NOTE — ED Notes (Signed)
Called in waiting area, no answer

## 2015-07-18 ENCOUNTER — Encounter (HOSPITAL_COMMUNITY): Payer: Self-pay | Admitting: *Deleted

## 2015-07-18 ENCOUNTER — Emergency Department (INDEPENDENT_AMBULATORY_CARE_PROVIDER_SITE_OTHER)
Admission: EM | Admit: 2015-07-18 | Discharge: 2015-07-18 | Disposition: A | Discharge status: Home / Self Care | Payer: Medicaid Other | Source: Home / Self Care | Attending: Family Medicine | Admitting: Family Medicine

## 2015-07-18 DIAGNOSIS — H1131 Conjunctival hemorrhage, right eye: Secondary | ICD-10-CM
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
## 2015-07-18 NOTE — ED Notes (Signed)
Pt  Has  Redness   Of the    r  Half  Of  The  r  Eye    Noticed  Today       pt  Reports  Has  Been  Vomiting  Over  The  Last  Several   Days  But that  Is  Better     denys  Any  Visual     Problems    Pt      Is  Sitting  Upright  On the  Exam table  Speaking  In  Complete   sentances

## 2015-09-04 ENCOUNTER — Telehealth: Payer: Self-pay | Admitting: Internal Medicine

## 2015-09-04 DIAGNOSIS — I10 Essential (primary) hypertension: Secondary | ICD-10-CM

## 2015-09-04 NOTE — Telephone Encounter (Signed)
lisinopril-hydrochlorothiazide (PRINZIDE,ZESTORETIC) 20-25 MG tablet amLODipine (NORVASC) 10 MG tablet  [facility]

## 2015-09-04 NOTE — Telephone Encounter (Signed)
Last visit 06/01/2015. Request for appointment letter sent 06/29/2015.

## 2015-09-05 NOTE — Telephone Encounter (Signed)
Pt rtc, made appt w/ dr Lovena Le for this fri 5/19 told her she can get refills then, she is agreeable

## 2015-09-05 NOTE — Telephone Encounter (Signed)
Call from pharmacy requesting refill of behalf of patient. Let them know that the prescription is still pending and that we have requested an appointment from the patient with no response.

## 2015-09-07 ENCOUNTER — Ambulatory Visit (INDEPENDENT_AMBULATORY_CARE_PROVIDER_SITE_OTHER): Payer: Medicaid Other | Admitting: Internal Medicine

## 2015-09-07 ENCOUNTER — Encounter: Payer: Self-pay | Admitting: Internal Medicine

## 2015-09-07 VITALS — BP 179/105 | HR 86 | Temp 98.1°F | Ht 68.0 in | Wt 260.2 lb

## 2015-09-07 DIAGNOSIS — I10 Essential (primary) hypertension: Secondary | ICD-10-CM

## 2015-09-07 MED ORDER — LISINOPRIL-HYDROCHLOROTHIAZIDE 20-25 MG PO TABS: 1.0000 | ORAL_TABLET | Freq: Every day | ORAL | Status: DC

## 2015-09-07 MED ORDER — AMLODIPINE BESYLATE 10 MG PO TABS: 10.0000 mg | ORAL_TABLET | Freq: Every day | ORAL | Status: DC

## 2015-09-07 NOTE — Assessment & Plan Note (Addendum)
Patient endorses medication adherence to Amlodipine and Lisinopril-HCTZ.  However, due to her chronic low back pain, she has also been taking Diclofenac 75 mg once daily, and an OTC pain reliever, which she believes to be tylenol.  She has had a migraine for the last few days, which has been consistent with her previous migraines.  She denies blurry vision, chest pain, or SOB. She denies tobacco.  She denies ever being evaluated for PCOS.  She has also never been evaluated for OSA.  Her menses are regular, though intermittently heavy.  On exam, she has hair on her upper lip and chin.   A/P: HTN, difficult to control despite 3 drug therapy. Given her obesity and previous pelvic US showing ovarian cysts, PCOS is a definite possibility and she may benefit from additional OCP and Spironolactone.  Will place future order for testosterone and have patient return for draw.  She would also likely benefit from sleep study testing for OSA to determine if this is adding to her presentation.  As she has been on high dose NSAIDs, will check renal function today as well. - BMP - Refill Amlodipine, Lisinopril- HCTZ - Testosterone 8 am, future.
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
## 2015-09-07 NOTE — Patient Instructions (Addendum)
1. Continue your blood pressure medications. 2. It is OK to continue the Diclofenac.  Just make sure you are not taking other NSAIDs.  Tylenol is OK. 3. Return to clinic next week to have your blood rechecked.  Polycystic Ovarian Syndrome Polycystic ovarian syndrome (PCOS) is a common hormonal disorder among women of reproductive age. Most women with PCOS grow many small cysts on their ovaries. PCOS can cause problems with your periods and make it difficult to get pregnant. It can also cause an increased risk of miscarriage with pregnancy. If left untreated, PCOS can lead to serious health problems, such as diabetes and heart disease. CAUSES The cause of PCOS is not fully understood, but genetics may be a factor. SIGNS AND SYMPTOMS   Infrequent or no menstrual periods.   Inability to get pregnant (infertility) because of not ovulating.   Increased growth of hair on the face, chest, stomach, back, thumbs, thighs, or toes.   Acne, oily skin, or dandruff.   Pelvic pain.   Weight gain or obesity, usually carrying extra weight around the waist.   Type 2 diabetes.   High cholesterol.   High blood pressure.   Female-pattern baldness or thinning hair.   Patches of thickened and dark brown or black skin on the neck, arms, breasts, or thighs.   Tiny excess flaps of skin (skin tags) in the armpits or neck area.   Excessive snoring and having breathing stop at times while asleep (sleep apnea).   Deepening of the voice.   Gestational diabetes when pregnant.  DIAGNOSIS  There is no single test to diagnose PCOS.   Your health care provider will:   Take a medical history.   Perform a pelvic exam.   Have ultrasonography done.   Check your female and female hormone levels.   Measure glucose or sugar levels in the blood.   Do other blood tests.   If you are producing too many female hormones, your health care provider will make sure it is from PCOS. At the  physical exam, your health care provider will want to evaluate the areas of increased hair growth. Try to allow natural hair growth for a few days before the visit.   During a pelvic exam, the ovaries may be enlarged or swollen because of the increased number of small cysts. This can be seen more easily by using vaginal ultrasonography or screening to examine the ovaries and lining of the uterus (endometrium) for cysts. The uterine lining may become thicker if you have not been having a regular period.  TREATMENT  Because there is no cure for PCOS, it needs to be managed to prevent problems. Treatments are based on your symptoms. Treatment is also based on whether you want to have a baby or whether you need contraception.  Treatment may include:   Progesterone hormone to start a menstrual period.   Birth control pills to make you have regular menstrual periods.   Medicines to make you ovulate, if you want to get pregnant.   Medicines to control your insulin.   Medicine to control your blood pressure.   Medicine and diet to control your high cholesterol and triglycerides in your blood.  Medicine to reduce excessive hair growth.  Surgery, making small holes in the ovary, to decrease the amount of female hormone production. This is done through a long, lighted tube (laparoscope) placed into the pelvis through a tiny incision in the lower abdomen.  HOME CARE INSTRUCTIONS  Only take over-the-counter or  prescription medicine as directed by your health care provider.  Pay attention to the foods you eat and your activity levels. This can help reduce the effects of PCOS.  Keep your weight under control.  Eat foods that are low in carbohydrate and high in fiber.  Exercise regularly. SEEK MEDICAL CARE IF:  Your symptoms do not get better with medicine.  You have new symptoms.   This information is not intended to replace advice given to you by your health care provider. Make sure  you discuss any questions you have with your health care provider.   Document Released: 08/01/2004 Document Revised: 01/26/2013 Document Reviewed: 09/23/2012 Elsevier Interactive Patient Education [employer].

## 2015-09-07 NOTE — Progress Notes (Signed)
Internal Medicine Clinic Attending  Case discussed with Dr. Lovena Le at the time of the visit.  We reviewed the resident's history and exam and pertinent patient test results.  I agree with the assessment, diagnosis, and plan of care documented in the resident's note. The patient presented for follow up of hypertension.

## 2015-09-08 LAB — BMP8+ANION GAP
Anion Gap: 18 mmol/L (ref 10.0–18.0)
BUN/Creatinine Ratio: 14 (ref 9–23)
BUN: 16 mg/dL (ref 6–20)
CO2: 22 mmol/L (ref 18–29)
Calcium: 9.4 mg/dL (ref 8.7–10.2)
Chloride: 100 mmol/L (ref 96–106)
Creatinine, Ser: 1.15 mg/dL — ABNORMAL HIGH (ref 0.57–1.00)
GFR calc Af Amer: 71 mL/min/{1.73_m2} (ref >59)
GFR calc non Af Amer: 62 mL/min/{1.73_m2} (ref >59)
Glucose: 86 mg/dL (ref 65–99)
Potassium: 4.3 mmol/L (ref 3.5–5.2)
Sodium: 140 mmol/L (ref 134–144)

## 2015-09-12 ENCOUNTER — Other Ambulatory Visit (INDEPENDENT_AMBULATORY_CARE_PROVIDER_SITE_OTHER): Payer: Medicaid Other

## 2015-09-12 DIAGNOSIS — I10 Essential (primary) hypertension: Secondary | ICD-10-CM | POA: Diagnosis not present

## 2015-09-12 MED FILL — AMLODIPINE BESYLATE 10 MG T: 10 | 30 days supply | Qty: 30 | Fill #0

## 2015-09-12 MED FILL — LISINOPRIL-HCTZ 20-25 MG TA: 20-25 | 30 days supply | Qty: 30 | Fill #0

## 2015-09-13 LAB — TESTOSTERONE: Testosterone: 30 ng/dL (ref 8–48)

## 2015-10-10 ENCOUNTER — Encounter: Payer: Self-pay | Admitting: *Deleted

## 2015-10-11 ENCOUNTER — Ambulatory Visit: Payer: Self-pay | Admitting: Internal Medicine

## 2015-10-11 ENCOUNTER — Encounter: Payer: Self-pay | Admitting: Internal Medicine

## 2015-11-06 ENCOUNTER — Ambulatory Visit (INDEPENDENT_AMBULATORY_CARE_PROVIDER_SITE_OTHER): Payer: Medicaid Other

## 2015-11-06 ENCOUNTER — Encounter (HOSPITAL_COMMUNITY): Payer: Self-pay | Admitting: Emergency Medicine

## 2015-11-06 ENCOUNTER — Ambulatory Visit (HOSPITAL_COMMUNITY)
Admission: EM | Admit: 2015-11-06 | Discharge: 2015-11-06 | Disposition: A | Discharge status: Home / Self Care | Payer: Medicaid Other | Attending: Family Medicine | Admitting: Family Medicine

## 2015-11-06 DIAGNOSIS — S8002XA Contusion of left knee, initial encounter: Secondary | ICD-10-CM | POA: Diagnosis not present

## 2015-11-06 DIAGNOSIS — Z041 Encounter for examination and observation following transport accident: Secondary | ICD-10-CM

## 2015-11-06 DIAGNOSIS — M549 Dorsalgia, unspecified: Secondary | ICD-10-CM

## 2015-11-06 MED ORDER — CYCLOBENZAPRINE HCL 5 MG PO TABS: 5.0000 mg | ORAL_TABLET | Freq: Three times a day (TID) | ORAL | Status: DC
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
## 2015-11-06 NOTE — Discharge Instructions (Signed)
See your doctor if further problems. °

## 2015-11-30 ENCOUNTER — Telehealth: Payer: Self-pay | Admitting: Internal Medicine

## 2015-11-30 NOTE — Telephone Encounter (Signed)
APT. REMINDER CALL, LMTCB °

## 2015-12-03 ENCOUNTER — Ambulatory Visit (HOSPITAL_COMMUNITY)
Admission: RE | Admit: 2015-12-03 | Discharge: 2015-12-03 | Disposition: A | Discharge status: Home / Self Care | Payer: Medicaid Other | Source: Ambulatory Visit | Attending: Internal Medicine | Admitting: Internal Medicine

## 2015-12-03 ENCOUNTER — Telehealth: Payer: Self-pay | Admitting: *Deleted

## 2015-12-03 ENCOUNTER — Ambulatory Visit (INDEPENDENT_AMBULATORY_CARE_PROVIDER_SITE_OTHER): Payer: Medicaid Other | Admitting: Internal Medicine

## 2015-12-03 ENCOUNTER — Ambulatory Visit: Payer: Self-pay

## 2015-12-03 ENCOUNTER — Encounter: Payer: Self-pay | Admitting: Internal Medicine

## 2015-12-03 VITALS — BP 159/109 | HR 50 | Temp 98.0°F | Ht 68.0 in | Wt 272.0 lb

## 2015-12-03 DIAGNOSIS — M545 Low back pain: Secondary | ICD-10-CM

## 2015-12-03 DIAGNOSIS — G8929 Other chronic pain: Secondary | ICD-10-CM

## 2015-12-03 DIAGNOSIS — I700 Atherosclerosis of aorta: Secondary | ICD-10-CM | POA: Diagnosis not present

## 2015-12-03 DIAGNOSIS — R079 Chest pain, unspecified: Secondary | ICD-10-CM

## 2015-12-03 DIAGNOSIS — E559 Vitamin D deficiency, unspecified: Secondary | ICD-10-CM | POA: Diagnosis not present

## 2015-12-03 DIAGNOSIS — G894 Chronic pain syndrome: Secondary | ICD-10-CM

## 2015-12-03 DIAGNOSIS — M791 Myalgia, unspecified site: Secondary | ICD-10-CM

## 2015-12-03 DIAGNOSIS — R0781 Pleurodynia: Secondary | ICD-10-CM | POA: Insufficient documentation

## 2015-12-03 DIAGNOSIS — Z87891 Personal history of nicotine dependence: Secondary | ICD-10-CM

## 2015-12-03 DIAGNOSIS — R51 Headache: Secondary | ICD-10-CM | POA: Diagnosis not present

## 2015-12-03 DIAGNOSIS — Z8739 Personal history of other diseases of the musculoskeletal system and connective tissue: Secondary | ICD-10-CM

## 2015-12-03 DIAGNOSIS — M109 Gout, unspecified: Secondary | ICD-10-CM

## 2015-12-03 MED ORDER — DICLOFENAC SODIUM 1 % TD GEL: 2.0000 g | Freq: Four times a day (QID) | TRANSDERMAL | 1 refills | Status: DC

## 2015-12-03 MED ORDER — ACETAMINOPHEN 500 MG PO TABS: 1000.0000 mg | ORAL_TABLET | Freq: Three times a day (TID) | ORAL | 2 refills | Status: AC | PRN

## 2015-12-03 MED ORDER — KETOROLAC TROMETHAMINE 10 MG PO TABS: 10.0000 mg | ORAL_TABLET | Freq: Three times a day (TID) | ORAL | 0 refills | Status: DC | PRN

## 2015-12-03 MED ORDER — TRAMADOL HCL 50 MG PO TABS: 50.0000 mg | ORAL_TABLET | Freq: Four times a day (QID) | ORAL | 0 refills | Status: DC | PRN

## 2015-12-03 NOTE — Patient Instructions (Addendum)
Jeanette Gamble  Please take Tramadol 50mg  every 6 hours as needed for R shoulder pain If pain continues you may take 1000mg  of Tylenol 3 times a day as needed Voltaren gel can be placed on joints for additional pain relief You will have blood work done today Please go upstairs for chest x-ray  Please come back early next week to further discuss your headaches and do a repeat EKG
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
## 2015-12-03 NOTE — Telephone Encounter (Signed)
Call from [facility] spoke with Jeanette Gamble-Patient was prescribed Tylenol 500 mg tablets.  Not covered by [employer]. Pharmacist wanted to know if Naprosyn could be used.  Patient has Kidney problems and cannot take Naprosyn or Ibuprofen per Dr. Magdalene River.   Pharmacist to inform patient. Pharmacist to see if patient will get Tylenol OTC.  Dr.Hoffman aware of.  Sander Nephew, RN 4:05 PM

## 2015-12-04 DIAGNOSIS — R0781 Pleurodynia: Secondary | ICD-10-CM | POA: Insufficient documentation

## 2015-12-04 DIAGNOSIS — I700 Atherosclerosis of aorta: Secondary | ICD-10-CM | POA: Insufficient documentation

## 2015-12-04 DIAGNOSIS — R079 Chest pain, unspecified: Secondary | ICD-10-CM | POA: Insufficient documentation

## 2015-12-04 DIAGNOSIS — G894 Chronic pain syndrome: Secondary | ICD-10-CM | POA: Insufficient documentation

## 2015-12-04 DIAGNOSIS — M791 Myalgia, unspecified site: Secondary | ICD-10-CM | POA: Insufficient documentation

## 2015-12-04 DIAGNOSIS — E559 Vitamin D deficiency, unspecified: Secondary | ICD-10-CM | POA: Insufficient documentation

## 2015-12-04 LAB — URIC ACID: Uric Acid: 8.5 mg/dL — ABNORMAL HIGH (ref 2.5–7.1)

## 2015-12-04 LAB — VITAMIN D 25 HYDROXY (VIT D DEFICIENCY, FRACTURES): Vit D, 25-Hydroxy: 22 ng/mL — ABNORMAL LOW (ref 30.0–100.0)

## 2015-12-04 LAB — TSH: TSH: 1.08 u[IU]/mL (ref 0.450–4.500)

## 2015-12-04 NOTE — Assessment & Plan Note (Signed)
Noted on chest x ray  

## 2015-12-04 NOTE — Assessment & Plan Note (Signed)
Assessment: Vitamin D deficency  Plan: Mildly low vitamin D Will start patient on 800 units of Vitamin D3 daily.

## 2015-12-04 NOTE — Assessment & Plan Note (Signed)
Assessment: Chest pain  Plan: EKG Chest Xray

## 2015-12-04 NOTE — Assessment & Plan Note (Signed)
Assessment: History of Gout  Plan: Joints do not appear to be acutely inflamed, difficult to assess if she is having frequent gout attacks.  Will repeat a Uric acid today.

## 2015-12-04 NOTE — Assessment & Plan Note (Signed)
Assessment: Rib pain  Plan: Chest Xray

## 2015-12-04 NOTE — Assessment & Plan Note (Addendum)
Assessment: Chronic pain  Plan: Difficult to assess patient with multiple chronic pain complaints in my first visit with Jeanette Gamble.  She does not appear to have any acute joint inflamation or evidence that requires urgent imaging or referral. Will obtain TSH, Vit D, and Uric Acid levels today and arrange for close follow up. For her chest pain we obtained an EKG which shows sinus bradycardia without ST or T wave abnormalities as well as a CXR. I have also given her a 5 day course of tramadol for her pain and Voltaren gel  Addendum:  Tramadol was for acute pain and not to be continued for future visits.   For next visit please address her chronic headaches and repeat EKG.

## 2015-12-04 NOTE — Assessment & Plan Note (Signed)
Assessment: Myalgia  Plan: Check TSH, Vitamin D

## 2015-12-06 ENCOUNTER — Other Ambulatory Visit: Payer: Self-pay

## 2015-12-06 DIAGNOSIS — I10 Essential (primary) hypertension: Secondary | ICD-10-CM

## 2015-12-06 NOTE — Telephone Encounter (Signed)
Amlodipine need to be filled.

## 2015-12-06 NOTE — Telephone Encounter (Signed)
Requesting lisinopril to be filled @ [facility].

## 2015-12-07 ENCOUNTER — Telehealth: Payer: Self-pay | Admitting: Internal Medicine

## 2015-12-07 NOTE — Telephone Encounter (Signed)
APT. REMINDER CALL, LMTCB °

## 2015-12-08 ENCOUNTER — Telehealth: Payer: Self-pay | Admitting: Internal Medicine

## 2015-12-08 MED ORDER — AMLODIPINE BESYLATE 10 MG PO TABS: 10.0000 mg | ORAL_TABLET | Freq: Every day | ORAL | 0 refills | Status: DC

## 2015-12-08 MED ORDER — LISINOPRIL-HYDROCHLOROTHIAZIDE 20-25 MG PO TABS: 1.0000 | ORAL_TABLET | Freq: Every day | ORAL | 0 refills | Status: DC

## 2015-12-08 NOTE — Telephone Encounter (Signed)
Attempted to call patient today 8/19 but no answer. Wanted to let patient know that she needed to start taking over the counter Vitamin D3 800 units daily.  And blood work will be rechecked on follow up visit to assess if this is the appropriate dose.

## 2015-12-10 ENCOUNTER — Ambulatory Visit (INDEPENDENT_AMBULATORY_CARE_PROVIDER_SITE_OTHER): Payer: Medicaid Other | Admitting: Internal Medicine

## 2015-12-10 ENCOUNTER — Encounter: Payer: Self-pay | Admitting: Internal Medicine

## 2015-12-10 VITALS — BP 173/121 | HR 77 | Temp 98.1°F | Ht 68.0 in | Wt 271.9 lb

## 2015-12-10 DIAGNOSIS — Z01810 Encounter for preprocedural cardiovascular examination: Secondary | ICD-10-CM | POA: Diagnosis present

## 2015-12-10 DIAGNOSIS — Z596 Low income: Secondary | ICD-10-CM | POA: Diagnosis not present

## 2015-12-10 DIAGNOSIS — Z7982 Long term (current) use of aspirin: Secondary | ICD-10-CM

## 2015-12-10 DIAGNOSIS — Z9114 Patient's other noncompliance with medication regimen: Secondary | ICD-10-CM

## 2015-12-10 DIAGNOSIS — I10 Essential (primary) hypertension: Secondary | ICD-10-CM

## 2015-12-10 DIAGNOSIS — Z87891 Personal history of nicotine dependence: Secondary | ICD-10-CM | POA: Diagnosis not present

## 2015-12-10 DIAGNOSIS — R0789 Other chest pain: Secondary | ICD-10-CM

## 2015-12-10 DIAGNOSIS — Z79899 Other long term (current) drug therapy: Secondary | ICD-10-CM

## 2015-12-10 DIAGNOSIS — E559 Vitamin D deficiency, unspecified: Secondary | ICD-10-CM | POA: Diagnosis not present

## 2015-12-10 DIAGNOSIS — Z0000 Encounter for general adult medical examination without abnormal findings: Secondary | ICD-10-CM | POA: Insufficient documentation

## 2015-12-10 DIAGNOSIS — R079 Chest pain, unspecified: Secondary | ICD-10-CM

## 2015-12-10 MED ORDER — AMLODIPINE BESYLATE 10 MG PO TABS: 10.0000 mg | ORAL_TABLET | Freq: Every day | ORAL | 11 refills | Status: DC

## 2015-12-10 MED ORDER — VITAMIN D (CHOLECALCIFEROL) 10 MCG (400 UNIT) PO CAPS: 800.0000 mg | ORAL_CAPSULE | Freq: Every day | ORAL | 2 refills | Status: DC

## 2015-12-10 MED ORDER — LISINOPRIL-HYDROCHLOROTHIAZIDE 20-25 MG PO TABS: 1.0000 | ORAL_TABLET | Freq: Every day | ORAL | 11 refills | Status: DC

## 2015-12-10 NOTE — Assessment & Plan Note (Signed)
Patient presents for clearance for a functional capacity evaluation by Orthopedic Surgery in order to qualify for Worker's Compensation. Patient describes a 2 month history of atypical sharp non-radiating left sided chest pain that occurs with exertion and is better with rest. It is also reproducible with palpation. Patient states the pain lasts all day. It can be associated with shortness of breath, nausea but no vomiting or diaphoresis. She has a history of HLD and HTN, but no diabetes. She denies family history of heart disease. EKG last week showed sinus bradycardia without ST or T wave changes. CXR showed aortic atherosclerosis. She states she has had this pain in the pas, but since being out of her medications, her pain has been worse. Given her age and normal EKG findings, pain is likely secondary to musculoskeletal etiology; however, some typical features are concerning for anginal type pain. We will attempt to control her blood pressure and assess improvement in pain. For now, I will not clear patient for FCE testing until her blood pressure is under better control.   Plan: -Not cleared for FCE

## 2015-12-10 NOTE — Assessment & Plan Note (Signed)
Vitamin D level 22 at previous visit.   Plan: -Sent Vitamin D 800 units QD replacement into pharmacy

## 2015-12-10 NOTE — Telephone Encounter (Signed)
No answer. LVM to return call

## 2015-12-10 NOTE — Assessment & Plan Note (Signed)
Patient describes a 2 month history of atypical sharp non-radiating left sided chest pain that occurs with exertion and is better with rest. It is also reproducible with palpation. Patient states the pain lasts all day. It can be associated with shortness of breath, nausea but no vomiting or diaphoresis. She has a history of HLD and HTN, but no diabetes. She denies family history of heart disease. EKG last week showed sinus bradycardia without ST or T wave changes. CXR showed aortic atherosclerosis. She states she has had this pain in the pas, but since being out of her medications, her pain has been worse. Given her age and normal EKG findings, pain is likely secondary to musculoskeletal etiology; however, some typical features are concerning for anginal type pain. We will attempt to control her blood pressure and assess improvement in pain.   Plan: -Refilled amlodipine 10 mg daily -Refilled lisinopril-HCTZ 20-25 mg QD -Continue ASA 81 mg daily -Consider lipid panel at follow up visit and statin based on ASCVD risk score -If pain is persistent despite BP control, would consider stress test
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
## 2015-12-10 NOTE — Patient Instructions (Signed)
PLEASE TAKE YOUR AMLODIPINE 10 MG DAILY AND YOUR LISINOPRIL-HCTZ ONE PILL ONCE A DAY. FOLLOW UP IN 2 WEEKS WITH YOUR PRIMARY CARE DOCTOR.

## 2015-12-10 NOTE — Assessment & Plan Note (Signed)
BP Readings from Last 3 Encounters:  12/10/15 (!) 173/121  12/03/15 (!) 159/109  11/06/15 142/69    Lab Results  Component Value Date   NA 140 09/07/2015   K 4.3 09/07/2015   CREATININE 1.15 (H) 09/07/2015    Assessment: Blood pressure control:  Uncontrolled Progress toward BP goal:   Deteriorated Comments: Patient admits to being out of her lisinopril-HCTZ and amlodipine for several weeks. She is limited by cost and infrequently takes her medications. Her last fill was in May 2017. I feel her uncontrolled HTN is primarily due to non-compliance. I emphasized the importance of these medications and the resulting effects from uncontrolled HTN. She voices understanding.   Plan: Medications:  continue current medications, refilled and sent to [facility] Other plans: Follow up in 2 weeks with PCP. If persistently elevated, would have Dr. Maudie Mercury or Edwena Blow check a Medicaid dispense record to assess compliance. She would also benefit from [facility] medication management with Medicaid. I will CC her PCP on this note.

## 2015-12-11 NOTE — Progress Notes (Signed)
Internal Medicine Clinic Attending  Case discussed with Dr. Burns at the time of the visit.  We reviewed the resident's history and exam and pertinent patient test results.  I agree with the assessment, diagnosis, and plan of care documented in the resident's note.  

## 2015-12-12 NOTE — Telephone Encounter (Signed)
Called pt - no answer; left message to start taking Vitamin D3 800 units daily per Dr Heber [place] and rx sent to pharmacy.

## 2015-12-13 NOTE — Progress Notes (Signed)
Internal Medicine Clinic Attending  I saw and evaluated the patient.  I personally confirmed the key portions of the history and exam documented by Dr. Heber [place] and I reviewed pertinent patient test results.  The assessment, diagnosis, and plan were formulated together and I agree with the documentation in the resident's note.  Patient had a myriad of complaints which were difficult to parse out.  Dr. Jodene Nam initial work up should be followed with further visits to address her headaches and other complaints.

## 2016-01-02 ENCOUNTER — Telehealth: Payer: Self-pay | Admitting: Internal Medicine
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
## 2016-01-02 NOTE — Telephone Encounter (Signed)
APT. REMINDER CALL, NO ANSWER, NO VOICEMAIL °

## 2016-01-03 ENCOUNTER — Encounter: Payer: Self-pay | Admitting: Internal Medicine

## 2016-01-08 ENCOUNTER — Telehealth: Payer: Self-pay | Admitting: Internal Medicine

## 2016-01-08 NOTE — Telephone Encounter (Signed)
APT. REMINDER CALL, LMTCB °

## 2016-01-09 ENCOUNTER — Ambulatory Visit (INDEPENDENT_AMBULATORY_CARE_PROVIDER_SITE_OTHER): Payer: Medicaid Other | Admitting: Internal Medicine

## 2016-01-09 ENCOUNTER — Ambulatory Visit: Payer: Self-pay

## 2016-01-09 ENCOUNTER — Encounter: Payer: Self-pay | Admitting: Internal Medicine

## 2016-01-09 VITALS — BP 157/99 | HR 82 | Temp 98.1°F | Ht 68.0 in | Wt 275.4 lb

## 2016-01-09 DIAGNOSIS — M25572 Pain in left ankle and joints of left foot: Secondary | ICD-10-CM | POA: Diagnosis not present

## 2016-01-09 DIAGNOSIS — M25472 Effusion, left ankle: Secondary | ICD-10-CM

## 2016-01-09 DIAGNOSIS — I10 Essential (primary) hypertension: Secondary | ICD-10-CM

## 2016-01-09 DIAGNOSIS — R600 Localized edema: Secondary | ICD-10-CM | POA: Diagnosis not present

## 2016-01-09 DIAGNOSIS — Z87891 Personal history of nicotine dependence: Secondary | ICD-10-CM | POA: Diagnosis not present

## 2016-01-09 DIAGNOSIS — M1A9XX0 Chronic gout, unspecified, without tophus (tophi): Secondary | ICD-10-CM | POA: Diagnosis not present

## 2016-01-09 DIAGNOSIS — R002 Palpitations: Secondary | ICD-10-CM | POA: Insufficient documentation

## 2016-01-09 DIAGNOSIS — Z79899 Other long term (current) drug therapy: Secondary | ICD-10-CM | POA: Diagnosis not present

## 2016-01-09 MED ORDER — LISINOPRIL 40 MG PO TABS: 40.0000 mg | ORAL_TABLET | Freq: Every day | ORAL | 3 refills | Status: DC

## 2016-01-09 MED ORDER — COLCHICINE 0.6 MG PO TABS: 0.6000 mg | ORAL_TABLET | Freq: Two times a day (BID) | ORAL | 2 refills | Status: DC

## 2016-01-09 MED ORDER — ATORVASTATIN CALCIUM 40 MG PO TABS: 40.0000 mg | ORAL_TABLET | Freq: Every day | ORAL | 11 refills | Status: DC

## 2016-01-09 NOTE — Assessment & Plan Note (Signed)
She also complained of intermittent palpitations with episodes almost daily,Occasionally uses stated with some chest discomfort. She denies any chest pain, diaphoresis, exertional dyspnea, orthopnea or PND. There is no history of associated nausea or vomiting. She had a regular rate and rhythm during her exam today. She had an EKG done during her previous visit with the same complaint, it was positive for sinus bradycardia without any acute changes . Plan. We will provide her with heart monitor for 48 hours.
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
## 2016-01-09 NOTE — Assessment & Plan Note (Signed)
She  came to the clinic with complaint of pain and swelling in her left ankle. 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 She denies any twisting or injury to her ankle. Pain was worsening over these days. It was 8/10 in intensity  today , extending to her foot and above to her leg. She denies any fever, recent travel or prolonged immobilization. On exam. Left ankle edema without any erythema, as compared to right ankle there was a mild increase in temperature on left ankle. ROM restricted due to pain.  Assessment. 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 Her recent uric acid check on September 14 was 8.5 which was high. She was on hydrochlorothiazide for her blood pressure, that can also precipitate gout.  Plan. Stopped hydrochlorothiazide. Colchicine 0.6 mg twice daily started 2 pills and then take 1 pill twice daily. We can consider adding a uricosuric agent if she will have more than 2 attacks in a year.

## 2016-01-09 NOTE — Assessment & Plan Note (Addendum)
BP Readings from Last 3 Encounters:  01/09/16 (!) 157/99  12/10/15 (!) 173/121  12/03/15 (!) 159/109   Her blood pressure was high again today. She states that she is using her medicine regularly. There were some issues her being noncompliant in the past. She presented today with an acute exacerbation of her acute gouty arthritis, hydrochlorothiazide can precipitate gout. She has risk factors for CAD, her previous chest x-ray shows atherosclerosis of aorta. She is not on any cholesterol lowering medicine. Plan. Discontinue hydrochlorothiazide. Increase her lisinopril to 40 mg daily. Continue amlodipine 10 mg daily. Check lipid profile today and start her on Lipitor 40 mg daily. Reassess her blood pressure in 2 weeks.

## 2016-01-09 NOTE — Patient Instructions (Signed)
Thank you for coming to the clinic today. It looks like you are having another attack of gouty arthritis. Your recent uric acid level was high. One of your blood pressure medicine called hydrochlorothiazide can cause a flareup of gout. I'm discontinuing that medicine, and increasing your lisinopril to 40 mg daily. I'm starting you on cholesterol-lowering medicine too. I'm giving to colchicine for your gouty pain, you will take 2 pills initially and then 1 pill twice daily. I'm also putting in order for heart monitor, you will get a call to set it up. Please follow up with [place] in 2 weeks to check your blood pressure.

## 2016-01-10 LAB — LIPID PANEL
Chol/HDL Ratio: 3.8 ratio units (ref 0.0–4.4)
Cholesterol, Total: 184 mg/dL (ref 100–199)
HDL: 49 mg/dL (ref >39)
LDL Calculated: 105 mg/dL — ABNORMAL HIGH (ref 0–99)
Triglycerides: 152 mg/dL — ABNORMAL HIGH (ref 0–149)
VLDL Cholesterol Cal: 30 mg/dL (ref 5–40)

## 2016-01-11 NOTE — Progress Notes (Signed)
Internal Medicine Clinic Attending  I saw and evaluated the patient.  I personally confirmed the key portions of the history and exam documented by Dr. Amin and I reviewed pertinent patient test results.  The assessment, diagnosis, and plan were formulated together and I agree with the documentation in the resident's note. 

## 2016-01-17 ENCOUNTER — Ambulatory Visit (INDEPENDENT_AMBULATORY_CARE_PROVIDER_SITE_OTHER): Payer: Medicaid Other

## 2016-01-17 DIAGNOSIS — R002 Palpitations: Secondary | ICD-10-CM | POA: Diagnosis not present

## 2016-01-24 ENCOUNTER — Ambulatory Visit: Payer: Self-pay

## 2016-01-24 ENCOUNTER — Telehealth: Payer: Self-pay | Admitting: Internal Medicine

## 2016-01-24 NOTE — Telephone Encounter (Signed)
APT. REMINDER CALL, LMTCB °

## 2016-01-24 NOTE — Telephone Encounter (Signed)
AT. REMINDER CALL, LMTCB °

## 2016-01-25 ENCOUNTER — Ambulatory Visit: Payer: Self-pay

## 2016-01-28 ENCOUNTER — Ambulatory Visit: Payer: Self-pay

## 2016-01-29 ENCOUNTER — Encounter: Payer: Self-pay | Admitting: Internal Medicine

## 2016-01-29 ENCOUNTER — Ambulatory Visit (INDEPENDENT_AMBULATORY_CARE_PROVIDER_SITE_OTHER): Payer: Medicaid Other | Admitting: Internal Medicine

## 2016-01-29 VITALS — BP 165/105 | HR 58 | Temp 98.1°F | Ht 68.0 in | Wt 273.9 lb

## 2016-01-29 DIAGNOSIS — R002 Palpitations: Secondary | ICD-10-CM | POA: Diagnosis not present

## 2016-01-29 DIAGNOSIS — M10072 Idiopathic gout, left ankle and foot: Secondary | ICD-10-CM | POA: Diagnosis not present

## 2016-01-29 DIAGNOSIS — Z87891 Personal history of nicotine dependence: Secondary | ICD-10-CM

## 2016-01-29 DIAGNOSIS — Z79899 Other long term (current) drug therapy: Secondary | ICD-10-CM | POA: Diagnosis not present

## 2016-01-29 DIAGNOSIS — I10 Essential (primary) hypertension: Secondary | ICD-10-CM

## 2016-01-29 NOTE — Assessment & Plan Note (Addendum)
Seen on 01/09/2016 and diagnosed with acute gout flare of L ankle.  Prescribed colchicine 0.6 mg twice daily, but has been taking colchicine once daily for past week instead of twice due to "foggy-headedness".  Reports welling and pain are improved ~75% from start of flare.   -Continue colchicine -If she continues to flares off of HCTZ, may need to add XO inhibitor

## 2016-01-29 NOTE — Assessment & Plan Note (Addendum)
BP Readings from Last 3 Encounters:  01/29/16 (!) 165/105  01/09/16 (!) 157/99  12/10/15 (!) 173/121   Lab Results  Component Value Date   CREATININE 1.15 (H) 09/07/2015   Lab Results  Component Value Date   K 4.3 09/07/2015   Discontinued HCTZ on 01/09/2016 due to gout flare, increased Lisinopril for 20 to 40 daily.  Now on maximum amlodipine and lisinopril.  Endorses medication compliance, increasing suspicion for resistant hypertension.  Previously negative for renal artery stenosis by [facility] in 2006.  Denies ever having had polysomnography performed despite being ordered years ago.  Assessment BP goal: 140/90 BP control: uncontrolled  Baseline bradycardia makes beta blocker inappropriate additional agent.  Would have to be confident in medication compliance to add clonidine or hydralazine.  Plan Medications: continue current meds Other: polysomnography.  RTC 1-2 months after sleep study.  Consider adding further antihypertensive agents at that time, perhaps alpha antagonist
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
## 2016-01-29 NOTE — Assessment & Plan Note (Addendum)
Wore Holter over weekend 9/30-10/1 of and returned one week ago.  Reports she did have episodes of palpitation while wearing.  She has had fewer palpitations in last week, but on Sun felt her heart fluttering, sharp stabbing pain, became short of breath, and flushed, dizzy for 5-10 minutes.  Recent EKG showing sinus bradycardia with no chronic or acute ischemic changes. -Holter interpretation pending

## 2016-01-29 NOTE — Patient Instructions (Addendum)
You were seen for follow-up of your high blood pressure and gout.  The gout in your ankle is improving - continue to take the colchicine for now, once a day is fine.  For your high blood pressure, make sure you take your medicines every day as prescribed.  We will have you schedule a sleep study to see if you have Obstructive Sleep Apnea (OSA), which is a sleep condition which can cause you to be tired during the day and also increase your blood pressure.  Please make sure to schedule the sleep study before your next clinic appointment.  Sleep Apnea  Sleep apnea is a sleep disorder characterized by abnormal pauses in breathing while you sleep. When your breathing pauses, the level of oxygen in your blood decreases. This causes you to move out of deep sleep and into light sleep. As a result, your quality of sleep is poor, and the system that carries your blood throughout your body (cardiovascular system) experiences stress. If sleep apnea remains untreated, the following conditions can develop:  High blood pressure (hypertension).  Coronary artery disease.  Inability to achieve or maintain an erection (impotence).  Impairment of your thought process (cognitive dysfunction). There are three types of sleep apnea: 1. Obstructive sleep apnea--Pauses in breathing during sleep because of a blocked airway. 2. Central sleep apnea--Pauses in breathing during sleep because the area of the brain that controls your breathing does not send the correct signals to the muscles that control breathing. 3. Mixed sleep apnea--A combination of both obstructive and central sleep apnea. RISK FACTORS The following risk factors can increase your risk of developing sleep apnea:  Being overweight.  Smoking.  Having narrow passages in your nose and throat.  Being of older age.  Being female.  Alcohol use.  Sedative and tranquilizer use.  Ethnicity. Among individuals younger than 35 years, African Americans are  at increased risk of sleep apnea. SYMPTOMS   Difficulty staying asleep.  Daytime sleepiness and fatigue.  Loss of energy.  Irritability.  Loud, heavy snoring.  Morning headaches.  Trouble concentrating.  Forgetfulness.  Decreased interest in sex.  Unexplained sleepiness. DIAGNOSIS  In order to diagnose sleep apnea, your caregiver will perform a physical examination. A sleep study done in the comfort of your own home may be appropriate if you are otherwise healthy. Your caregiver may also recommend that you spend the night in a sleep lab. In the sleep lab, several monitors record information about your heart, lungs, and brain while you sleep. Your leg and arm movements and blood oxygen level are also recorded. TREATMENT The following actions may help to resolve mild sleep apnea:  Sleeping on your side.   Using a decongestant if you have nasal congestion.   Avoiding the use of depressants, including alcohol, sedatives, and narcotics.   Losing weight and modifying your diet if you are overweight. There also are devices and treatments to help open your airway:  Oral appliances. These are custom-made mouthpieces that shift your lower jaw forward and slightly open your bite. This opens your airway.  Devices that create positive airway pressure. This positive pressure "splints" your airway open to help you breathe better during sleep. The following devices create positive airway pressure:  Continuous positive airway pressure (CPAP) device. The CPAP device creates a continuous level of air pressure with an air pump. The air is delivered to your airway through a mask while you sleep. This continuous pressure keeps your airway open.  Nasal expiratory positive airway  pressure (EPAP) device. The EPAP device creates positive air pressure as you exhale. The device consists of single-use valves, which are inserted into each nostril and held in place by adhesive. The valves create very  little resistance when you inhale but create much more resistance when you exhale. That increased resistance creates the positive airway pressure. This positive pressure while you exhale keeps your airway open, making it easier to breath when you inhale again.  Bilevel positive airway pressure (BPAP) device. The BPAP device is used mainly in patients with central sleep apnea. This device is similar to the CPAP device because it also uses an air pump to deliver continuous air pressure through a mask. However, with the BPAP machine, the pressure is set at two different levels. The pressure when you exhale is lower than the pressure when you inhale.  Surgery. Typically, surgery is only done if you cannot comply with less invasive treatments or if the less invasive treatments do not improve your condition. Surgery involves removing excess tissue in your airway to create a wider passage way.   This information is not intended to replace advice given to you by your health care provider. Make sure you discuss any questions you have with your health care provider.   Document Released: 03/28/2002 Document Revised: 04/28/2014 Document Reviewed: 08/14/2011 Elsevier Interactive Patient Education [employer].

## 2016-02-04 NOTE — Progress Notes (Signed)
I saw and evaluated the patient. I personally confirmed the key portions of Dr. Rowe Pavy history and exam and reviewed pertinent patient test results. The assessment, diagnosis, and plan were formulated together and I agree with the documentation in the resident's note.

## 2016-03-06 ENCOUNTER — Ambulatory Visit (HOSPITAL_BASED_OUTPATIENT_CLINIC_OR_DEPARTMENT_OTHER): Payer: Medicaid Other | Attending: Internal Medicine

## 2016-04-08 ENCOUNTER — Ambulatory Visit: Payer: Self-pay

## 2016-04-08 ENCOUNTER — Encounter: Payer: Self-pay | Admitting: Internal Medicine

## 2016-05-07 ENCOUNTER — Ambulatory Visit: Payer: Self-pay

## 2016-05-29 ENCOUNTER — Telehealth: Payer: Self-pay | Admitting: Internal Medicine

## 2016-05-29 NOTE — Telephone Encounter (Signed)
Calling to confirm appt for 05/30/16 at 10:15 ° °

## 2016-05-30 ENCOUNTER — Encounter: Payer: Self-pay | Admitting: Internal Medicine

## 2016-05-30 ENCOUNTER — Ambulatory Visit: Payer: Self-pay

## 2016-06-25 ENCOUNTER — Telehealth: Payer: Self-pay | Admitting: Internal Medicine

## 2016-06-25 NOTE — Telephone Encounter (Signed)
APT. REMINDER CALL, NO ANSWER, NO VOICEMAIL °

## 2016-06-26 ENCOUNTER — Telehealth: Payer: Self-pay | Admitting: Internal Medicine

## 2016-06-26 ENCOUNTER — Encounter: Payer: Self-pay | Admitting: Internal Medicine

## 2016-06-26 NOTE — Telephone Encounter (Signed)
APT. REMINDER CALL, NO ANSWER, NO VOICEMAIL °

## 2016-06-27 ENCOUNTER — Other Ambulatory Visit (HOSPITAL_COMMUNITY)
Admission: RE | Admit: 2016-06-27 | Discharge: 2016-06-27 | Disposition: A | Discharge status: Home / Self Care | Payer: Medicaid Other | Source: Ambulatory Visit | Attending: Internal Medicine | Admitting: Internal Medicine

## 2016-06-27 ENCOUNTER — Encounter: Payer: Self-pay | Admitting: Internal Medicine

## 2016-06-27 ENCOUNTER — Ambulatory Visit (INDEPENDENT_AMBULATORY_CARE_PROVIDER_SITE_OTHER): Payer: Medicaid Other | Admitting: Internal Medicine

## 2016-06-27 DIAGNOSIS — Z1151 Encounter for screening for human papillomavirus (HPV): Secondary | ICD-10-CM | POA: Insufficient documentation

## 2016-06-27 DIAGNOSIS — F17210 Nicotine dependence, cigarettes, uncomplicated: Secondary | ICD-10-CM | POA: Diagnosis not present

## 2016-06-27 DIAGNOSIS — Z124 Encounter for screening for malignant neoplasm of cervix: Secondary | ICD-10-CM

## 2016-06-27 DIAGNOSIS — E669 Obesity, unspecified: Secondary | ICD-10-CM | POA: Diagnosis not present

## 2016-06-27 DIAGNOSIS — R05 Cough: Secondary | ICD-10-CM | POA: Diagnosis not present

## 2016-06-27 DIAGNOSIS — R87619 Unspecified abnormal cytological findings in specimens from cervix uteri: Secondary | ICD-10-CM | POA: Diagnosis present

## 2016-06-27 DIAGNOSIS — L739 Follicular disorder, unspecified: Secondary | ICD-10-CM

## 2016-06-27 DIAGNOSIS — Z8742 Personal history of other diseases of the female genital tract: Secondary | ICD-10-CM | POA: Diagnosis not present

## 2016-06-27 DIAGNOSIS — Z01419 Encounter for gynecological examination (general) (routine) without abnormal findings: Secondary | ICD-10-CM | POA: Diagnosis not present

## 2016-06-27 DIAGNOSIS — R059 Cough, unspecified: Secondary | ICD-10-CM

## 2016-06-27 DIAGNOSIS — R0982 Postnasal drip: Secondary | ICD-10-CM | POA: Diagnosis not present

## 2016-06-27 DIAGNOSIS — Z0142 Encounter for cervical smear to confirm findings of recent normal smear following initial abnormal smear: Secondary | ICD-10-CM | POA: Diagnosis not present

## 2016-06-27 DIAGNOSIS — J029 Acute pharyngitis, unspecified: Secondary | ICD-10-CM

## 2016-06-27 MED ORDER — FLUTICASONE PROPIONATE 50 MCG/ACT NA SUSP: 2.0000 | Freq: Every day | NASAL | 2 refills | Status: DC

## 2016-06-27 MED ORDER — BENZONATATE 100 MG PO CAPS: 100.0000 mg | ORAL_CAPSULE | Freq: Four times a day (QID) | ORAL | 1 refills | Status: DC | PRN

## 2016-06-27 NOTE — Assessment & Plan Note (Signed)
Having cough with some sputum for last 3 weeks and some sore throats. No SOB, has occasional wheezing, no fevers. Lung exam is normal. No lymph nodes, no oropharyngeal exudates. Has post nasal drip, not taking any meds for this. Has been taking OTC cough meds. No sick contacts.  - symptom likely 2/2 to post nasal drip +/- viral URI. Will treat with flonase and tessalon

## 2016-06-27 NOTE — Patient Instructions (Signed)
Take tessalon for cough and use flonase nasal spray twice daily for the nasal drip.  Will call you with the PAP results.   Come back and see your Primary doctor in 4-6 weeks.
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
## 2016-06-27 NOTE — Assessment & Plan Note (Signed)
ASCUS on 2012, then 2015 was negative but transformation zone was absent. HPV not detected on 2015. No discharge or pelvic complaints today. - repeat PAP today.

## 2016-06-30 NOTE — Progress Notes (Signed)
Internal Medicine Clinic Attending  Case discussed with Dr. Ahmed at the time of the visit.  We reviewed the resident's history and exam and pertinent patient test results.  I agree with the assessment, diagnosis, and plan of care documented in the resident's note. 

## 2016-07-01 LAB — CYTOLOGY - PAP
Diagnosis: NEGATIVE
HPV: NOT DETECTED

## 2016-07-03 ENCOUNTER — Telehealth: Payer: Self-pay

## 2016-07-03 NOTE — Telephone Encounter (Signed)
I have attempted to call her few times yesterday and today to go over her Pap results but it keeps going to the VM. See my result note for details on the result.

## 2016-07-03 NOTE — Telephone Encounter (Signed)
She would like to be treated for the trich, she will have a talk with her partner about treatment, please send the script to her usual pharmacy

## 2016-07-03 NOTE — Telephone Encounter (Signed)
Requesting lab result. Please call back. 

## 2016-07-04 MED ORDER — METRONIDAZOLE 500 MG PO TABS: 2000.0000 mg | ORAL_TABLET | Freq: Once | ORAL | 0 refills | Status: AC

## 2016-07-04 NOTE — Telephone Encounter (Signed)
Tried to call pt to let her know, no answer, no vmail

## 2016-07-04 NOTE — Telephone Encounter (Signed)
I have sent metronidazole 2000mg  once to her pharmacy.

## 2016-07-31 NOTE — Telephone Encounter (Signed)
Helen can you please close this enounter °

## 2016-08-11 ENCOUNTER — Ambulatory Visit: Payer: Self-pay

## 2016-08-12 ENCOUNTER — Ambulatory Visit (INDEPENDENT_AMBULATORY_CARE_PROVIDER_SITE_OTHER): Payer: Medicaid Other | Admitting: Internal Medicine

## 2016-08-12 ENCOUNTER — Other Ambulatory Visit: Payer: Self-pay | Admitting: *Deleted

## 2016-08-12 ENCOUNTER — Encounter (INDEPENDENT_AMBULATORY_CARE_PROVIDER_SITE_OTHER): Payer: Self-pay

## 2016-08-12 DIAGNOSIS — M10072 Idiopathic gout, left ankle and foot: Secondary | ICD-10-CM

## 2016-08-12 DIAGNOSIS — M10071 Idiopathic gout, right ankle and foot: Secondary | ICD-10-CM

## 2016-08-12 MED ORDER — COLCHICINE 0.6 MG PO TABS
0.6000 mg | ORAL_TABLET | Freq: Every day | ORAL | 2 refills | Status: DC
Start: 2016-08-12 — End: 2016-10-30

## 2016-08-12 MED ORDER — IBUPROFEN 800 MG PO TABS: 800.0000 mg | ORAL_TABLET | Freq: Three times a day (TID) | ORAL | 0 refills | Status: DC | PRN

## 2016-08-12 MED ORDER — IBUPROFEN 200 MG PO TABS
200.0000 mg | ORAL_TABLET | Freq: Once | ORAL | Status: AC
  Administered 2016-08-12: 800 mg via ORAL

## 2016-08-12 NOTE — Patient Instructions (Addendum)
[name],  Dennis Bast are having a gout flare. I am sending in a prescription for Ibuprofen and colchicine.   Please follow up with Dr. Heber Briarcliff on 4/26 at 1:15 PM.   Gout Gout is painful swelling that can occur in some of your joints. Gout is a type of arthritis. This condition is caused by having too much uric acid in your body. Uric acid is a chemical that forms when your body breaks down substances called purines. Purines are important for building body proteins. When your body has too much uric acid, sharp crystals can form and build up inside your joints. This causes pain and swelling. Gout attacks can happen quickly and be very painful (acute gout). Over time, the attacks can affect more joints and become more frequent (chronic gout). Gout can also cause uric acid to build up under your skin and inside your kidneys. What are the causes? This condition is caused by too much uric acid in your blood. This can occur because:  Your kidneys do not remove enough uric acid from your blood. This is the most common cause.  Your body makes too much uric acid. This can occur with some cancers and cancer treatments. It can also occur if your body is breaking down too many red blood cells (hemolytic anemia).  You eat too many foods that are high in purines. These foods include organ meats and some seafood. Alcohol, especially beer, is also high in purines. A gout attack may be triggered by trauma or stress. What increases the risk? This condition is more likely to develop in people who:  Have a family history of gout.  Are female and middle-aged.  Are female and have gone through menopause.  Are obese.  Frequently drink alcohol, especially beer.  Are dehydrated.  Lose weight too quickly.  Have an organ transplant.  Have lead poisoning.  Take certain medicines, including aspirin, cyclosporine, diuretics, levodopa, and niacin.  Have kidney disease or psoriasis. What are the signs or  symptoms? An attack of acute gout happens quickly. It usually occurs in just one joint. The most common place is the big toe. Attacks often start at night. Other joints that may be affected include joints of the feet, ankle, knee, fingers, wrist, or elbow. Symptoms may include:  Severe pain.  Warmth.  Swelling.  Stiffness.  Tenderness. The affected joint may be very painful to touch.  Shiny, red, or purple skin.  Chills and fever. Chronic gout may cause symptoms more frequently. More joints may be involved. You may also have white or yellow lumps (tophi) on your hands or feet or in other areas near your joints. How is this diagnosed? This condition is diagnosed based on your symptoms, medical history, and physical exam. You may have tests, such as:  Blood tests to measure uric acid levels.  Removal of joint fluid with a needle (aspiration) to look for uric acid crystals.  X-rays to look for joint damage. How is this treated? Treatment for this condition has two phases: treating an acute attack and preventing future attacks. Acute gout treatment may include medicines to reduce pain and swelling, including:  NSAIDs.  Steroids. These are strong anti-inflammatory medicines that can be taken by mouth (orally) or injected into a joint.  Colchicine. This medicine relieves pain and swelling when it is taken soon after an attack. It can be given orally or through an IV tube. Preventive treatment may include:  Daily use of smaller doses of NSAIDs or colchicine.  Use of a medicine that reduces uric acid levels in your blood.  Changes to your diet. You may need to see a specialist about healthy eating (dietitian). Follow these instructions at home: During a Gout Attack   If directed, apply ice to the affected area:  Put ice in a plastic bag.  Place a towel between your skin and the bag.  Leave the ice on for 20 minutes, 2-3 times a day.  Rest the joint as much as possible. If  the affected joint is in your leg, you may be given crutches to use.  Raise (elevate) the affected joint above the level of your heart as often as possible.  Drink enough fluids to keep your urine clear or pale yellow.  Take over-the-counter and prescription medicines only as told by your health care provider.  Do not drive or operate heavy machinery while taking prescription pain medicine.  Follow instructions from your health care provider about eating or drinking restrictions.  Return to your normal activities as told by your health care provider. Ask your health care provider what activities are safe for you. Avoiding Future Gout Attacks   Follow a low-purine diet as told by your dietitian or health care provider. Avoid foods and drinks that are high in purines, including liver, kidney, anchovies, asparagus, herring, mushrooms, mussels, and beer.  Limit alcohol intake to no more than 1 drink a day for nonpregnant women and 2 drinks a day for men. One drink equals 12 oz of beer, 5 oz of wine, or 1 oz of hard liquor.  Maintain a healthy weight or lose weight if you are overweight. If you want to lose weight, talk with your health care provider. It is important that you do not lose weight too quickly.  Start or maintain an exercise program as told by your health care provider.  Drink enough fluids to keep your urine clear or pale yellow.  Take over-the-counter and prescription medicines only as told by your health care provider.  Keep all follow-up visits as told by your health care provider. This is important. Contact a health care provider if:  You have another gout attack.  You continue to have symptoms of a gout attack after10 days of treatment.  You have side effects from your medicines.  You have chills or a fever.  You have burning pain when you urinate.  You have pain in your lower back or belly. Get help right away if:  You have severe or uncontrolled  pain.  You cannot urinate. This information is not intended to replace advice given to you by your health care provider. Make sure you discuss any questions you have with your health care provider. Document Released: 04/04/2000 Document Revised: 09/13/2015 Document Reviewed: 01/18/2015 Elsevier Interactive Patient Education  2017 [employer].

## 2016-08-12 NOTE — Assessment & Plan Note (Addendum)
Ms. Mulkern has a history of gout. Most recently had a flare on 01/09/2016 of her left ankle. She was prescribed colchiine 0.6 mg bid at that time. On follow up 01/2016 she had been taking it daily. She was continued on the colchicine 0.6 mg daily at that time and her HCTZ was discontinued. Does not appear to have been on a XO inhibitor at any point. Most recent uric acid from 11/2015 was 8.5.   Today, she reports a two day history of sudden onset severe right great toe pain. Reports erythema, warmth and swelling. She is having difficulties with ambulation due to pain. Reports the pian feels like her previous gout flares. Took colchicine last night and this morning with no relief.   Most recent BMET from 08/2015 shows Cr of 1.15 with GFR 62. No known history of DM.   Assessment: acute gout flare  Plan: Will treat acute flare with NSAIDS as she has not seen much response to colchicine.  Has appointment scheudled with PCP in 2 days, can consider prednisone course if no improvement at that time. Would recheck Uric acid level after acute flare resolves, continue colchicine 0.6 mg daily and consider starting allopurinol with uric acid goal <6. Continue colchicine until 3 months after achieving uric acid goal.
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
## 2016-08-13 ENCOUNTER — Other Ambulatory Visit: Payer: Self-pay | Admitting: Internal Medicine

## 2016-08-13 ENCOUNTER — Ambulatory Visit: Payer: Self-pay

## 2016-08-14 ENCOUNTER — Encounter: Payer: Self-pay | Admitting: Internal Medicine

## 2016-08-14 NOTE — Progress Notes (Deleted)
Htn Pap smear

## 2016-08-15 NOTE — Progress Notes (Signed)
Internal Medicine Clinic Attending  Case discussed with Dr. Boswell at the time of the visit.  We reviewed the resident's history and exam and pertinent patient test results.  I agree with the assessment, diagnosis, and plan of care documented in the resident's note.  

## 2016-08-28 ENCOUNTER — Encounter: Payer: Self-pay | Admitting: Internal Medicine

## 2016-10-09 ENCOUNTER — Encounter: Payer: Self-pay | Admitting: Internal Medicine

## 2016-10-09 NOTE — Progress Notes (Deleted)
Vitamin D

## 2016-10-30 ENCOUNTER — Ambulatory Visit (INDEPENDENT_AMBULATORY_CARE_PROVIDER_SITE_OTHER): Payer: Medicaid Other | Admitting: Internal Medicine

## 2016-10-30 ENCOUNTER — Other Ambulatory Visit: Payer: Self-pay | Admitting: Internal Medicine

## 2016-10-30 VITALS — BP 184/130 | HR 60 | Temp 98.0°F | Ht 68.0 in | Wt 271.2 lb

## 2016-10-30 DIAGNOSIS — G8929 Other chronic pain: Secondary | ICD-10-CM | POA: Diagnosis not present

## 2016-10-30 DIAGNOSIS — Z6841 Body Mass Index (BMI) 40.0 and over, adult: Secondary | ICD-10-CM | POA: Diagnosis not present

## 2016-10-30 DIAGNOSIS — F17210 Nicotine dependence, cigarettes, uncomplicated: Secondary | ICD-10-CM

## 2016-10-30 DIAGNOSIS — I10 Essential (primary) hypertension: Secondary | ICD-10-CM

## 2016-10-30 DIAGNOSIS — Z79899 Other long term (current) drug therapy: Secondary | ICD-10-CM | POA: Diagnosis not present

## 2016-10-30 DIAGNOSIS — E669 Obesity, unspecified: Secondary | ICD-10-CM

## 2016-10-30 DIAGNOSIS — M1A9XX0 Chronic gout, unspecified, without tophus (tophi): Secondary | ICD-10-CM

## 2016-10-30 DIAGNOSIS — M1A0720 Idiopathic chronic gout, left ankle and foot, without tophus (tophi): Secondary | ICD-10-CM

## 2016-10-30 DIAGNOSIS — M545 Low back pain, unspecified: Secondary | ICD-10-CM

## 2016-10-30 MED ORDER — CLONIDINE HCL 0.1 MG/24HR TD PTWK: 0.1000 mg | MEDICATED_PATCH | TRANSDERMAL | 11 refills | Status: DC

## 2016-10-30 MED ORDER — BACLOFEN 5 MG PO TABS: 1.0000 | ORAL_TABLET | Freq: Every day | ORAL | 0 refills | Status: DC

## 2016-10-30 MED ORDER — COLCHICINE 0.6 MG PO TABS: 0.6000 mg | ORAL_TABLET | Freq: Every day | ORAL | 2 refills | Status: DC

## 2016-10-30 MED ORDER — MELOXICAM 5 MG PO CAPS: 1.0000 | ORAL_CAPSULE | Freq: Every day | ORAL | 0 refills | Status: DC

## 2016-10-30 NOTE — Patient Instructions (Signed)
Jeanette Gamble  It was a pleasure seeing you today.    An x-ray of your foot has been ordered.  Please visit the radiology department to have this done. For your blood pressure peaked please start using the clonidine patch and continue your lisinopril and amlodipine.  Please start taking baclofen 1 pill at night to help with muscle relaxation. And please take meloxicam 5 mg once a day to help with your back pain.
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
## 2016-10-31 ENCOUNTER — Ambulatory Visit (HOSPITAL_COMMUNITY)
Admission: RE | Admit: 2016-10-31 | Discharge: 2016-10-31 | Disposition: A | Discharge status: Home / Self Care | Payer: Medicaid Other | Source: Ambulatory Visit | Attending: Internal Medicine | Admitting: Internal Medicine

## 2016-10-31 DIAGNOSIS — M1A9XX0 Chronic gout, unspecified, without tophus (tophi): Secondary | ICD-10-CM | POA: Diagnosis present

## 2016-11-06 DIAGNOSIS — G8929 Other chronic pain: Secondary | ICD-10-CM

## 2016-11-06 DIAGNOSIS — M549 Dorsalgia, unspecified: Secondary | ICD-10-CM

## 2016-11-06 HISTORY — DX: Other chronic pain: G89.29

## 2016-11-06 MED ORDER — LISINOPRIL 40 MG PO TABS: 40.0000 mg | ORAL_TABLET | Freq: Every day | ORAL | 11 refills | Status: DC

## 2016-11-06 NOTE — Assessment & Plan Note (Signed)
Assessment: Essential hypertension Patient reports compliance with amlodipine 10 mg and lisinopril 40 mg. Today's blood pressure was 195/131 and on repeat it was 173/124.  Patient's blood pressure is uncontrolled and not at goal of 130/80.  Will add clonidine patch and reassess blood pressure and medication management on 7/26 visit.  Plan - clonidine 0.1mg  patch - refill amlodipine and lisinopril

## 2016-11-06 NOTE — Progress Notes (Signed)
Internal Medicine Clinic Attending  Case discussed with Dr. Hoffman at the time of the visit.  We reviewed the resident's history and exam and pertinent patient test results.  I agree with the assessment, diagnosis, and plan of care documented in the resident's note.  

## 2016-11-06 NOTE — Assessment & Plan Note (Signed)
Assessment:  Chronic gout Patient continues to have pain in her first metatarsal. On exam there is warmth and slight erythema to her first metatarsal.  She was assessed on 08/12/2016 for acute gout flare and was started on colchicine at that time and told to continue for 3 months and she is currently on colchicine 0.6 mg daily.  She also took ibuprofen but states this did not help with her gout. Since patient is continuing to have pain will obtain plain film of left foot.  Patient was given a month supply of meloxicam.  If patient continues to have pain and erythema to the area at 7/26 visit will try course of prednisone.    Plan -x-ray of left foot -meloxicam

## 2016-11-06 NOTE — Assessment & Plan Note (Signed)
Assessment:  Chronic low back pain Patient reports a work-related injury to her back in October/2017. Her imaging is not in our EMR.  Patient today continues to report chronic low back pain without radiculopathy. She denies any numbness or weakness.  On physical exam there was paraspinal musculature tenderness and tightness to her lower back. Will try a trial of baclofen and meloxicam to help with symptoms.  Plan -baclofen 5mg  at night -meloxicam 5mg  daily  - release form for imaging

## 2016-11-13 ENCOUNTER — Ambulatory Visit (INDEPENDENT_AMBULATORY_CARE_PROVIDER_SITE_OTHER): Payer: Medicaid Other | Admitting: Internal Medicine

## 2016-11-13 VITALS — BP 148/105 | HR 78 | Temp 98.0°F | Ht 68.0 in | Wt 264.8 lb

## 2016-11-13 DIAGNOSIS — Z79899 Other long term (current) drug therapy: Secondary | ICD-10-CM | POA: Diagnosis not present

## 2016-11-13 DIAGNOSIS — F17210 Nicotine dependence, cigarettes, uncomplicated: Secondary | ICD-10-CM | POA: Diagnosis not present

## 2016-11-13 DIAGNOSIS — M545 Low back pain: Secondary | ICD-10-CM | POA: Diagnosis not present

## 2016-11-13 DIAGNOSIS — G8929 Other chronic pain: Secondary | ICD-10-CM

## 2016-11-13 DIAGNOSIS — Z8249 Family history of ischemic heart disease and other diseases of the circulatory system: Secondary | ICD-10-CM

## 2016-11-13 DIAGNOSIS — M1A0720 Idiopathic chronic gout, left ankle and foot, without tophus (tophi): Secondary | ICD-10-CM | POA: Diagnosis not present

## 2016-11-13 DIAGNOSIS — I10 Essential (primary) hypertension: Secondary | ICD-10-CM

## 2016-11-13 MED ORDER — NAPROXEN 500 MG PO TABS: 500.0000 mg | ORAL_TABLET | Freq: Two times a day (BID) | ORAL | 0 refills | Status: DC
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
## 2016-11-13 NOTE — Patient Instructions (Signed)
Jeanette Gamble,  It was a pleasure seeing you today. Please follow up in 3 months.  In the meantime please was at the acute care clinic for any acute medical needs.

## 2016-11-14 LAB — BMP8+ANION GAP
Anion Gap: 15 mmol/L (ref 10.0–18.0)
BUN/Creatinine Ratio: 10 (ref 9–23)
BUN: 17 mg/dL (ref 6–20)
CO2: 24 mmol/L (ref 20–29)
Calcium: 9.6 mg/dL (ref 8.7–10.2)
Chloride: 104 mmol/L (ref 96–106)
Creatinine, Ser: 1.66 mg/dL — ABNORMAL HIGH (ref 0.57–1.00)
GFR calc Af Amer: 45 mL/min/{1.73_m2} — ABNORMAL LOW (ref >59)
GFR calc non Af Amer: 39 mL/min/{1.73_m2} — ABNORMAL LOW (ref >59)
Glucose: 88 mg/dL (ref 65–99)
Potassium: 4.5 mmol/L (ref 3.5–5.2)
Sodium: 143 mmol/L (ref 134–144)

## 2016-11-18 NOTE — Assessment & Plan Note (Signed)
Assessment: Chronic back pain On previous clinic visit patient was started on baclofen 5 mg at night. She states that this has helped with her back pain.  I offered naproxen since she couldn't afford meloxicam.  Patient agreed.   Plan -Continue baclofen 5 mg at night -naproxen  -BMET  Addendum Patient's bmet showed a creatinine of 1.6 with baseline of 1.1 and decrease in GFR. Called patient and she stated she had not started naproxen.  I told her not to take the medication and to return to clinic for blood work only in the next week.  Patient was agreeable to the plan.

## 2016-11-18 NOTE — Assessment & Plan Note (Signed)
Assessment: Chronic gout On clinic visit 7/12 patient had continued pain at her first metatarsal. On exam there was warmth and slight erythema to her first metatarsal.  She is currently on colchicine 0.6 mg daily. She did not take meloxicam prescribed at last visit due to cost.  Today there is no erythema or warmth to the area.  Patient states that her symptoms have improved and she is able ambulate better since last visit.  Plan -continue to monitor

## 2016-11-18 NOTE — Assessment & Plan Note (Signed)
Assessment: Essential hypertension On previous clinic visit on 7/12 patient's blood pressure was elevated at 195/131 and on repeat it was 173/124. Patient at that time was compliant with amlodipine 10 mg and lisinopril 40 mg daily. Patient was then started on a clonidine patch at 0.1 mg. Today she states that she has had no issues with the patch except that it fell off yesterday and has been without a patch for her the past 24 hours. She states that she changes her patch on Fridays. I told her she can go ahead and put on the next patch. Her blood pressure today was 148/105. Will not make changes at this time and will have follow-up in 3 months. If patient's blood pressure continues to be elevated at that time with compliance of her amlodipine, lisinopril and clonidine patch will increase the dose of clonidine.  Plan Continue amlodipine Continue lisinopril Continue clonidine patch  Addendum: Bmet showed elevated creatinine and decrease in GFR.  Told patient to hold lisinopril for 2 days and repeat blood work

## 2016-11-21 NOTE — Progress Notes (Signed)
Internal Medicine Clinic Attending  Case discussed with Dr. Hoffman at the time of the visit.  We reviewed the resident's history and exam and pertinent patient test results.  I agree with the assessment, diagnosis, and plan of care documented in the resident's note.  

## 2016-12-02 ENCOUNTER — Other Ambulatory Visit (INDEPENDENT_AMBULATORY_CARE_PROVIDER_SITE_OTHER): Payer: Medicaid Other

## 2016-12-02 ENCOUNTER — Other Ambulatory Visit: Payer: Self-pay | Admitting: *Deleted

## 2016-12-02 DIAGNOSIS — G8929 Other chronic pain: Secondary | ICD-10-CM

## 2016-12-02 DIAGNOSIS — M545 Low back pain, unspecified: Secondary | ICD-10-CM

## 2016-12-02 DIAGNOSIS — R05 Cough: Secondary | ICD-10-CM

## 2016-12-02 DIAGNOSIS — R059 Cough, unspecified: Secondary | ICD-10-CM

## 2016-12-03 LAB — BMP8+ANION GAP
Anion Gap: 17 mmol/L (ref 10.0–18.0)
BUN/Creatinine Ratio: 12 (ref 9–23)
BUN: 14 mg/dL (ref 6–20)
CO2: 20 mmol/L (ref 20–29)
Calcium: 9 mg/dL (ref 8.7–10.2)
Chloride: 103 mmol/L (ref 96–106)
Creatinine, Ser: 1.15 mg/dL — ABNORMAL HIGH (ref 0.57–1.00)
GFR calc Af Amer: 71 mL/min/{1.73_m2} (ref >59)
GFR calc non Af Amer: 61 mL/min/{1.73_m2} (ref >59)
Glucose: 80 mg/dL (ref 65–99)
Potassium: 4.2 mmol/L (ref 3.5–5.2)
Sodium: 140 mmol/L (ref 134–144)

## 2016-12-03 MED ORDER — FLUTICASONE PROPIONATE 50 MCG/ACT NA SUSP: 2.0000 | Freq: Every day | NASAL | 2 refills | Status: DC

## 2017-01-01 ENCOUNTER — Other Ambulatory Visit: Payer: Self-pay | Admitting: Internal Medicine
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
## 2017-02-12 ENCOUNTER — Encounter: Payer: Self-pay | Admitting: Internal Medicine
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
## 2017-02-26 ENCOUNTER — Ambulatory Visit (INDEPENDENT_AMBULATORY_CARE_PROVIDER_SITE_OTHER): Payer: Medicaid Other | Admitting: Internal Medicine

## 2017-02-26 ENCOUNTER — Other Ambulatory Visit: Payer: Self-pay

## 2017-02-26 ENCOUNTER — Encounter: Payer: Self-pay | Admitting: Internal Medicine

## 2017-02-26 VITALS — BP 131/101 | HR 74 | Temp 98.0°F | Ht 68.0 in | Wt 273.8 lb

## 2017-02-26 DIAGNOSIS — I10 Essential (primary) hypertension: Secondary | ICD-10-CM | POA: Diagnosis present

## 2017-02-26 DIAGNOSIS — G8929 Other chronic pain: Secondary | ICD-10-CM

## 2017-02-26 DIAGNOSIS — G8921 Chronic pain due to trauma: Secondary | ICD-10-CM

## 2017-02-26 DIAGNOSIS — Z79899 Other long term (current) drug therapy: Secondary | ICD-10-CM | POA: Diagnosis not present

## 2017-02-26 DIAGNOSIS — F329 Major depressive disorder, single episode, unspecified: Secondary | ICD-10-CM

## 2017-02-26 DIAGNOSIS — Z634 Disappearance and death of family member: Secondary | ICD-10-CM

## 2017-02-26 DIAGNOSIS — M545 Low back pain: Secondary | ICD-10-CM

## 2017-02-26 DIAGNOSIS — F332 Major depressive disorder, recurrent severe without psychotic features: Secondary | ICD-10-CM

## 2017-02-26 DIAGNOSIS — F17210 Nicotine dependence, cigarettes, uncomplicated: Secondary | ICD-10-CM

## 2017-02-26 DIAGNOSIS — Z6841 Body Mass Index (BMI) 40.0 and over, adult: Secondary | ICD-10-CM | POA: Diagnosis not present

## 2017-02-26 MED ORDER — SERTRALINE HCL 50 MG PO TABS: 50.0000 mg | ORAL_TABLET | Freq: Every day | ORAL | 2 refills | Status: DC

## 2017-02-26 MED ORDER — TRAMADOL HCL 50 MG PO TABS: 50.0000 mg | ORAL_TABLET | ORAL | 0 refills | Status: DC | PRN

## 2017-02-26 MED ORDER — ONDANSETRON HCL 4 MG PO TABS: 4.0000 mg | ORAL_TABLET | Freq: Three times a day (TID) | ORAL | 0 refills | Status: DC | PRN

## 2017-02-26 MED ORDER — BACLOFEN 5 MG PO TABS: 1.0000 | ORAL_TABLET | Freq: Every day | ORAL | 0 refills | Status: DC

## 2017-02-26 NOTE — Patient Instructions (Signed)
Ms. Henderson  It was a pleasure seeing you today. Please start taking Zoloft 50 mg daily. A referral has been made to [facility] for your back pain.  Please follow up with me in 1-2 months.

## 2017-03-01 NOTE — Assessment & Plan Note (Addendum)
Assessment:  Major depressive disorder Patient lost her brother in a car accident 5 months ago.  She has been feeling worsening depressive symptoms recently.  Her Phq-9 was 22. She currently denies being suicidal.  Will start zoloft 50mg  daily today.  I also think she would benefit from counseling and will refer her to see a psychologist.  Plan -refer to psychology  -zoloft 50mg  daily

## 2017-03-01 NOTE — Assessment & Plan Note (Addendum)
Assessment:  Back pain  Patient reports a work-related injury to her back in October/2017.  She has been seen in the clinic for this similar issue.  She states she continues to have pain despite conservative treatment for over 6 months.  She states she has seen orthopedic surgery for this issue in the past.  She denies any falls, shooting pains or focal weakness.  Due to fluctuation in kidney function will avoid NSAIDs.  Will give short course of tramadol and have her follow up with [facility].  Plan -[facility] -tramadol

## 2017-03-01 NOTE — Assessment & Plan Note (Signed)
Assessment:  Essential hypertension Patient currently takes clonidine 0.1mg  patch, lisinopril 40mg  and amlodipine 10mg  daily and reports adherence to medications.  Today's blood pressure is 131/101  Plan -continue current blood pressure regimen

## 2017-03-02 NOTE — Progress Notes (Signed)
Internal Medicine Clinic Attending  Case discussed with Dr. Hoffman at the time of the visit.  We reviewed the resident's history and exam and pertinent patient test results.  I agree with the assessment, diagnosis, and plan of care documented in the resident's note.  

## 2017-03-20 ENCOUNTER — Other Ambulatory Visit: Payer: Self-pay | Admitting: Internal Medicine

## 2017-03-23 ENCOUNTER — Ambulatory Visit: Payer: Self-pay

## 2017-03-23 ENCOUNTER — Encounter: Payer: Self-pay | Admitting: Internal Medicine
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
## 2017-05-07 ENCOUNTER — Encounter: Payer: Self-pay | Admitting: Internal Medicine

## 2017-05-20 ENCOUNTER — Other Ambulatory Visit: Payer: Self-pay | Admitting: Internal Medicine

## 2017-05-26 ENCOUNTER — Ambulatory Visit (INDEPENDENT_AMBULATORY_CARE_PROVIDER_SITE_OTHER): Payer: Medicaid Other | Admitting: Internal Medicine

## 2017-05-26 ENCOUNTER — Encounter: Payer: Self-pay | Admitting: Internal Medicine

## 2017-05-26 ENCOUNTER — Other Ambulatory Visit: Payer: Self-pay

## 2017-05-26 VITALS — BP 154/106 | HR 69 | Temp 98.0°F | Ht 68.0 in | Wt 277.6 lb

## 2017-05-26 DIAGNOSIS — F17210 Nicotine dependence, cigarettes, uncomplicated: Secondary | ICD-10-CM

## 2017-05-26 DIAGNOSIS — R05 Cough: Secondary | ICD-10-CM

## 2017-05-26 DIAGNOSIS — F329 Major depressive disorder, single episode, unspecified: Secondary | ICD-10-CM | POA: Diagnosis not present

## 2017-05-26 DIAGNOSIS — M546 Pain in thoracic spine: Secondary | ICD-10-CM | POA: Diagnosis not present

## 2017-05-26 DIAGNOSIS — I10 Essential (primary) hypertension: Secondary | ICD-10-CM | POA: Diagnosis not present

## 2017-05-26 DIAGNOSIS — M79601 Pain in right arm: Secondary | ICD-10-CM

## 2017-05-26 DIAGNOSIS — M1A0720 Idiopathic chronic gout, left ankle and foot, without tophus (tophi): Secondary | ICD-10-CM | POA: Diagnosis not present

## 2017-05-26 DIAGNOSIS — R059 Cough, unspecified: Secondary | ICD-10-CM

## 2017-05-26 DIAGNOSIS — Z79899 Other long term (current) drug therapy: Secondary | ICD-10-CM | POA: Diagnosis not present

## 2017-05-26 DIAGNOSIS — F419 Anxiety disorder, unspecified: Secondary | ICD-10-CM

## 2017-05-26 DIAGNOSIS — K219 Gastro-esophageal reflux disease without esophagitis: Secondary | ICD-10-CM | POA: Diagnosis not present

## 2017-05-26 DIAGNOSIS — M62830 Muscle spasm of back: Secondary | ICD-10-CM | POA: Insufficient documentation

## 2017-05-26 DIAGNOSIS — S46819A Strain of other muscles, fascia and tendons at shoulder and upper arm level, unspecified arm, initial encounter: Secondary | ICD-10-CM

## 2017-05-26 MED ORDER — KETOROLAC TROMETHAMINE 30 MG/ML IJ SOLN
30.0000 mg | Freq: Once | INTRAMUSCULAR | Status: AC
  Administered 2017-05-26: 30 mg via INTRAMUSCULAR

## 2017-05-26 MED ORDER — FLUTICASONE PROPIONATE 50 MCG/ACT NA SUSP: 2.0000 | Freq: Every day | NASAL | 2 refills | Status: DC

## 2017-05-26 MED ORDER — COLCHICINE 0.6 MG PO TABS: 0.6000 mg | ORAL_TABLET | Freq: Every day | ORAL | 0 refills | Status: DC

## 2017-05-26 MED ORDER — BACLOFEN 5 MG PO TABS: 1.0000 | ORAL_TABLET | Freq: Every day | ORAL | 0 refills | Status: AC

## 2017-05-26 NOTE — Patient Instructions (Addendum)
It was a pleasure to see you today [name]. Please make the following changes:  -Please take baclofen for your shoulder pain -Please take flonase for your allergies to help your cough  If you have any questions or concerns, please call our clinic at [phone] between 9am-5pm and after hours call [phone] and ask for the internal medicine resident on call. If you feel you are having a medical emergency please call 911.   Thank you, we look forward to help you remain healthy!  Jeanette Mage, MD Internal Medicine PGY1
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
## 2017-05-27 NOTE — Progress Notes (Signed)
Internal Medicine Clinic Attending  I saw and evaluated the patient.  I personally confirmed the key portions of the history and exam documented by Dr. Chundi and I reviewed pertinent patient test results.  The assessment, diagnosis, and plan were formulated together and I agree with the documentation in the resident's note. 

## 2017-05-27 NOTE — Assessment & Plan Note (Signed)
Refilled the patient's colchicine 0.6mg  qd

## 2017-05-27 NOTE — Assessment & Plan Note (Signed)
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 However, she states that the pain got worse over the past 2-3 days. The pain is present from her right shoulder and radiates down to her right hand. She describes the pain to be 8/10 intensity, sharp in nature, constantly present, and accompanied with numbness and tingling. She states that she has taken ibuprofen and applied heat without relief.  On exam, the patient had difficulty moving her arm in any direction, but it was more marked with external rotation. The pain could be localized to be originating at the right upper thoracic back. It is likely that the patient has a sprain of her trapezius muscle.   Although the patient reports symptoms of numbness and tingling, it is unlikely that the patient has neuropathy. No cervical imaging could be found on records.   -Gave patient 30mg  Toradol injection in clinic -Prescribed 5mg  baclofen for 14 days -Instructed the patient to continue using heat and move around to help alleviate the pain

## 2017-06-02 ENCOUNTER — Ambulatory Visit (INDEPENDENT_AMBULATORY_CARE_PROVIDER_SITE_OTHER): Payer: Medicaid Other | Admitting: Internal Medicine

## 2017-06-02 VITALS — BP 181/124 | HR 81 | Temp 98.4°F | Wt 279.9 lb

## 2017-06-02 DIAGNOSIS — Z8739 Personal history of other diseases of the musculoskeletal system and connective tissue: Secondary | ICD-10-CM

## 2017-06-02 DIAGNOSIS — M25461 Effusion, right knee: Secondary | ICD-10-CM

## 2017-06-02 DIAGNOSIS — M25561 Pain in right knee: Secondary | ICD-10-CM

## 2017-06-02 LAB — SYNOVIAL CELL COUNT + DIFF, W/ CRYSTALS
Crystals, Fluid: NONE SEEN
Eosinophils-Synovial: 0 % (ref 0–1)
Lymphocytes-Synovial Fld: 1 % (ref 0–20)
Monocyte-Macrophage-Synovial Fluid: 53 % (ref 50–90)
Neutrophil, Synovial: 46 % — ABNORMAL HIGH (ref 0–25)
Other Cells-SYN: 0
WBC, Synovial: 580 /mm3 — ABNORMAL HIGH (ref 0–200)

## 2017-06-02 LAB — GRAM STAIN: Gram Stain: NONE SEEN
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
## 2017-06-02 NOTE — Patient Instructions (Addendum)
Ms. Paulsen,   For your knee pain, please continue to take the colchicine 0.6 mg twice daily until your pain improves. You can also use tylenol and voltaren gel.   FOLLOW-UP INSTRUCTIONS When: 1-2 months with Dr. Heber [place]  For: general check up  What to bring: all of your medication bottles

## 2017-06-04 ENCOUNTER — Encounter: Payer: Self-pay | Admitting: Internal Medicine

## 2017-06-04 DIAGNOSIS — M25461 Effusion, right knee: Secondary | ICD-10-CM | POA: Insufficient documentation

## 2017-06-04 NOTE — Progress Notes (Signed)
Internal Medicine Clinic Attending  I saw and evaluated the patient.  I personally confirmed the key portions of the history and exam documented by Dr. Hetty Ely and I reviewed pertinent patient test results.  The assessment, diagnosis, and plan were formulated together and I agree with the documentation in the resident's note. I was present for the entirety of the procedure.   Long axis view of the right suprapatellar bursa with large anechoic free flowing effusion. This was the area that was drained. Cell count consistent with inflammatory, maybe related to recent fall, I am not convinced it was gout because of negative crystal study. We should re-examine the knee when this acute flare resolves to see if the effusion totally resolves as well.

## 2017-06-04 NOTE — Assessment & Plan Note (Signed)
Patient describes right knee pain which started suddenly on two days ago. Since the pain began the knee has become progressively more swollen and now it is difficult to walk on that knee. She denies trauma or falls, chills or nausea. She does have a history of gout but usually it is her toes that are affected. The gout diagnosis is not crystal proven. POC US exam revealed a large knee effusion, 60 cc of yellow non cloudy fluid were drained from the knee.  - synovial fluid crystal, cells and cultures > 500 wbcs, no crystals seen  - continue colchicine 0.6 mg twice daily, tylenol and voltaren  - may benefit from maintenance urate lowering therapy and treat to target initiated after this flair has resolved

## 2017-06-05 ENCOUNTER — Telehealth: Payer: Self-pay | Admitting: Internal Medicine

## 2017-06-05 NOTE — Telephone Encounter (Signed)
Patient is wanting to know if the results came back from the fluid from leg and patient would like some pain medicine for her leg

## 2017-06-07 LAB — CULTURE, BODY FLUID W GRAM STAIN -BOTTLE: Culture: NO GROWTH

## 2017-06-09 NOTE — Telephone Encounter (Signed)
Please communicate results of the knee fluid to the patient:  Consistent with inflammation could be trauma or gout although there were no gout crystals seen in the fluid so this may be less likely.  She should try taking the colchicine 2 tablets now ( 1.2 mg ) then one tablet (0.6 mg ) an hour later followed by one tablet in the morning and one at night starting tomorrow. This should help her pain, if it has not started helping in a few days and the pain is still present she should call back

## 2017-06-23 ENCOUNTER — Other Ambulatory Visit: Payer: Self-pay | Admitting: Internal Medicine

## 2017-06-25 NOTE — Telephone Encounter (Signed)
Have attempted to reach pt and no answer, attempt 3/6 and 3/7

## 2017-07-02 ENCOUNTER — Other Ambulatory Visit: Payer: Self-pay | Admitting: Internal Medicine

## 2017-07-09 ENCOUNTER — Ambulatory Visit (HOSPITAL_COMMUNITY)
Admission: EM | Admit: 2017-07-09 | Discharge: 2017-07-09 | Disposition: A | Discharge status: Home / Self Care | Payer: Medicaid Other | Attending: Family Medicine | Admitting: Family Medicine

## 2017-07-09 ENCOUNTER — Encounter (HOSPITAL_COMMUNITY): Payer: Self-pay | Admitting: Family Medicine

## 2017-07-09 DIAGNOSIS — M25561 Pain in right knee: Secondary | ICD-10-CM

## 2017-07-09 MED ORDER — MELOXICAM 15 MG PO TABS: 15.0000 mg | ORAL_TABLET | Freq: Every day | ORAL | 0 refills | Status: AC

## 2017-07-09 MED ORDER — HYDROCODONE-ACETAMINOPHEN 5-325 MG PO TABS: 1.0000 | ORAL_TABLET | Freq: Four times a day (QID) | ORAL | 0 refills | Status: DC | PRN

## 2017-07-09 NOTE — ED Triage Notes (Signed)
Pt here for left knee pain. Reports that last month some fluid was drawn from knee. sts constant knee pain.
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
## 2017-07-30 ENCOUNTER — Encounter: Payer: Self-pay | Admitting: Internal Medicine

## 2017-07-30 ENCOUNTER — Other Ambulatory Visit: Payer: Self-pay | Admitting: Internal Medicine

## 2017-07-30 NOTE — Telephone Encounter (Signed)
Patient is requesting a refill on pain medicine and fluticasone, pls contact patient

## 2017-08-03 NOTE — Telephone Encounter (Signed)
Norco is not a current medication I prescribe.  She needs to be seen for evaluation if she is requiring narcotic medication.  Thanks!

## 2017-08-11 ENCOUNTER — Other Ambulatory Visit: Payer: Self-pay | Admitting: Internal Medicine

## 2017-09-03 ENCOUNTER — Encounter: Payer: Self-pay | Admitting: Internal Medicine
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
## 2017-09-10 ENCOUNTER — Encounter: Payer: Self-pay | Admitting: Internal Medicine

## 2017-09-15 ENCOUNTER — Other Ambulatory Visit: Payer: Self-pay | Admitting: Internal Medicine

## 2017-09-15 DIAGNOSIS — R05 Cough: Secondary | ICD-10-CM

## 2017-09-15 DIAGNOSIS — R059 Cough, unspecified: Secondary | ICD-10-CM

## 2017-10-01 ENCOUNTER — Encounter: Payer: Self-pay | Admitting: Internal Medicine

## 2017-10-06 ENCOUNTER — Ambulatory Visit: Payer: Self-pay

## 2017-10-06 ENCOUNTER — Encounter: Payer: Self-pay | Admitting: Internal Medicine

## 2017-10-12 ENCOUNTER — Encounter: Payer: Self-pay | Admitting: Internal Medicine

## 2017-10-13 ENCOUNTER — Ambulatory Visit (HOSPITAL_COMMUNITY)
Admission: RE | Admit: 2017-10-13 | Discharge: 2017-10-13 | Disposition: A | Discharge status: Home / Self Care | Payer: Medicaid Other | Source: Ambulatory Visit | Attending: Internal Medicine | Admitting: Internal Medicine

## 2017-10-13 ENCOUNTER — Encounter: Payer: Self-pay | Admitting: Internal Medicine

## 2017-10-13 ENCOUNTER — Ambulatory Visit: Payer: Self-pay

## 2017-10-13 ENCOUNTER — Ambulatory Visit (INDEPENDENT_AMBULATORY_CARE_PROVIDER_SITE_OTHER): Payer: Medicaid Other | Admitting: Internal Medicine

## 2017-10-13 VITALS — BP 192/132 | HR 62 | Temp 97.3°F | Ht 67.5 in

## 2017-10-13 DIAGNOSIS — I10 Essential (primary) hypertension: Secondary | ICD-10-CM

## 2017-10-13 DIAGNOSIS — F339 Major depressive disorder, recurrent, unspecified: Secondary | ICD-10-CM

## 2017-10-13 DIAGNOSIS — M25532 Pain in left wrist: Secondary | ICD-10-CM | POA: Diagnosis not present

## 2017-10-13 DIAGNOSIS — M25561 Pain in right knee: Secondary | ICD-10-CM | POA: Diagnosis not present

## 2017-10-13 DIAGNOSIS — G8929 Other chronic pain: Secondary | ICD-10-CM | POA: Insufficient documentation

## 2017-10-13 DIAGNOSIS — M79601 Pain in right arm: Secondary | ICD-10-CM

## 2017-10-13 DIAGNOSIS — M549 Dorsalgia, unspecified: Secondary | ICD-10-CM | POA: Diagnosis not present

## 2017-10-13 DIAGNOSIS — Z79899 Other long term (current) drug therapy: Secondary | ICD-10-CM | POA: Diagnosis not present

## 2017-10-13 DIAGNOSIS — M25519 Pain in unspecified shoulder: Secondary | ICD-10-CM

## 2017-10-13 MED ORDER — DULOXETINE HCL 30 MG PO CPEP: ORAL_CAPSULE | ORAL | 0 refills | Status: DC

## 2017-10-13 MED ORDER — SERTRALINE HCL 25 MG PO TABS: 25.0000 mg | ORAL_TABLET | Freq: Every day | ORAL | 0 refills | Status: DC

## 2017-10-13 NOTE — Assessment & Plan Note (Signed)
Blood pressure markedly elevated today at 190/132.  It appears that she has chronic issues with her hypertension and is currently on amlodipine 10, lisinopril 40, clonidine 0.1 mg patch weekly.  She is wearing her clonidine patch today and reports she is compliant with her medications.  Her blood pressure was much improved at her follow-up visit after starting the amlodipine.  She denies any symptoms today including no chest pain, shortness of breath, headaches, blurred vision.  Plan: We will have patient continue her current medications and she will need follow-up on her chronic blood pressure management

## 2017-10-13 NOTE — Patient Instructions (Signed)
Jeanette Gamble,  I would like to get some x-rays of your cervical spine today.  You can go upstairs today and have that done or come back later this week at your convenience.  I am going to also change your pressure medicine to another medicine that can also help with pain.  I am going to send in a prescription for your Zoloft at a lower dose, 25 mg, for a 7-day course.  After you finish the 25 mg for the next week you can stop that medication.  The following week and when she used to start the duloxetine (Cymbalta) 30 mg daily.  Take 1 pill daily for a week and then the following week you can take 2 pills daily.  Go ahead and get an appointment scheduled with your primary care physician for follow-up.

## 2017-10-13 NOTE — Assessment & Plan Note (Signed)
See HPI for full details of today's visit.  Plan: We will get C-spine x-rays today.  Will follow results.

## 2017-10-13 NOTE — Assessment & Plan Note (Signed)
See HPI for full details.  Assessment and plan: Patient with numerous chronic pain complaints.  I will switch her Zoloft that she is on for depression to duloxetine to day.  Taper/ramping instruction provided patient today and she voiced understanding.  We will have patient follow-up with her PCP for further evaluation and management.
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
## 2017-10-14 NOTE — Progress Notes (Signed)
Internal Medicine Clinic Attending  Case discussed with Dr. Boswell at the time of the visit.  We reviewed the resident's history and exam and pertinent patient test results.  I agree with the assessment, diagnosis, and plan of care documented in the resident's note.  

## 2017-10-16 ENCOUNTER — Other Ambulatory Visit: Payer: Self-pay | Admitting: Internal Medicine

## 2017-10-16 DIAGNOSIS — I10 Essential (primary) hypertension: Secondary | ICD-10-CM

## 2017-10-16 NOTE — Telephone Encounter (Signed)
Please make future appointment.

## 2017-10-19 NOTE — Telephone Encounter (Signed)
Dr. Kalman Shan will not be available until November 24, 2017.  Patient has been dismissed from practice that will be effective on 11/19/17.  No future appointment can be scheduled.  Forwarding back to triage.

## 2017-10-21 ENCOUNTER — Emergency Department (HOSPITAL_COMMUNITY): Payer: Medicaid Other

## 2017-10-21 ENCOUNTER — Encounter (HOSPITAL_COMMUNITY): Payer: Self-pay | Admitting: Emergency Medicine

## 2017-10-21 ENCOUNTER — Other Ambulatory Visit: Payer: Self-pay

## 2017-10-21 ENCOUNTER — Emergency Department (HOSPITAL_COMMUNITY)
Admission: EM | Admit: 2017-10-21 | Discharge: 2017-10-21 | Disposition: A | Discharge status: Home / Self Care | Payer: Medicaid Other | Attending: Emergency Medicine | Admitting: Emergency Medicine

## 2017-10-21 DIAGNOSIS — N83209 Unspecified ovarian cyst, unspecified side: Secondary | ICD-10-CM | POA: Diagnosis not present

## 2017-10-21 DIAGNOSIS — Z7982 Long term (current) use of aspirin: Secondary | ICD-10-CM | POA: Diagnosis not present

## 2017-10-21 DIAGNOSIS — Z79899 Other long term (current) drug therapy: Secondary | ICD-10-CM | POA: Diagnosis not present

## 2017-10-21 DIAGNOSIS — N83201 Unspecified ovarian cyst, right side: Secondary | ICD-10-CM | POA: Diagnosis not present

## 2017-10-21 DIAGNOSIS — R1031 Right lower quadrant pain: Secondary | ICD-10-CM | POA: Diagnosis not present

## 2017-10-21 DIAGNOSIS — F17210 Nicotine dependence, cigarettes, uncomplicated: Secondary | ICD-10-CM | POA: Diagnosis not present

## 2017-10-21 DIAGNOSIS — I10 Essential (primary) hypertension: Secondary | ICD-10-CM | POA: Diagnosis not present

## 2017-10-21 DIAGNOSIS — D259 Leiomyoma of uterus, unspecified: Secondary | ICD-10-CM | POA: Diagnosis not present

## 2017-10-21 DIAGNOSIS — R109 Unspecified abdominal pain: Secondary | ICD-10-CM | POA: Diagnosis not present

## 2017-10-21 LAB — URINALYSIS, ROUTINE W REFLEX MICROSCOPIC
Bilirubin Urine: NEGATIVE
Glucose, UA: NEGATIVE mg/dL
Ketones, ur: NEGATIVE mg/dL
Nitrite: NEGATIVE
Protein, ur: NEGATIVE mg/dL
Specific Gravity, Urine: 1.015 (ref 1.005–1.030)
pH: 5 (ref 5.0–8.0)

## 2017-10-21 LAB — COMPREHENSIVE METABOLIC PANEL
ALT: 17 U/L (ref 0–44)
AST: 18 U/L (ref 15–41)
Albumin: 3.6 g/dL (ref 3.5–5.0)
Alkaline Phosphatase: 63 U/L (ref 38–126)
Anion gap: 5 (ref 5–15)
BUN: 20 mg/dL (ref 6–20)
CO2: 23 mmol/L (ref 22–32)
Calcium: 8.6 mg/dL — ABNORMAL LOW (ref 8.9–10.3)
Chloride: 110 mmol/L (ref 98–111)
Creatinine, Ser: 1.37 mg/dL — ABNORMAL HIGH (ref 0.44–1.00)
GFR calc Af Amer: 56 mL/min — ABNORMAL LOW (ref >60)
GFR calc non Af Amer: 49 mL/min — ABNORMAL LOW (ref >60)
Glucose, Bld: 102 mg/dL — ABNORMAL HIGH (ref 70–99)
Potassium: 3.8 mmol/L (ref 3.5–5.1)
Sodium: 138 mmol/L (ref 135–145)
Total Bilirubin: 0.3 mg/dL (ref 0.3–1.2)
Total Protein: 6.9 g/dL (ref 6.5–8.1)

## 2017-10-21 LAB — CBC
HCT: 31.6 % — ABNORMAL LOW (ref 36.0–46.0)
Hemoglobin: 9.4 g/dL — ABNORMAL LOW (ref 12.0–15.0)
MCH: 25.5 pg — ABNORMAL LOW (ref 26.0–34.0)
MCHC: 29.7 g/dL — ABNORMAL LOW (ref 30.0–36.0)
MCV: 85.9 fL (ref 78.0–100.0)
Platelets: 221 10*3/uL (ref 150–400)
RBC: 3.68 MIL/uL — ABNORMAL LOW (ref 3.87–5.11)
RDW: 16.1 % — ABNORMAL HIGH (ref 11.5–15.5)
WBC: 8.8 10*3/uL (ref 4.0–10.5)

## 2017-10-21 LAB — I-STAT BETA HCG BLOOD, ED (MC, WL, AP ONLY): I-stat hCG, quantitative: <5 m[IU]/mL (ref <5)

## 2017-10-21 LAB — LIPASE, BLOOD: Lipase: 28 U/L (ref 11–51)

## 2017-10-21 MED ORDER — ONDANSETRON HCL 4 MG/2ML IJ SOLN
4.0000 mg | Freq: Once | INTRAMUSCULAR | Status: AC
  Administered 2017-10-21: 4 mg via INTRAVENOUS
  Filled 2017-10-21: qty 2

## 2017-10-21 MED ORDER — ONDANSETRON HCL 4 MG/2ML IJ SOLN: 4.0000 mg | Freq: Once | INTRAMUSCULAR | Status: DC

## 2017-10-21 MED ORDER — ONDANSETRON 4 MG PO TBDP: 4.0000 mg | ORAL_TABLET | Freq: Three times a day (TID) | ORAL | 0 refills | Status: DC | PRN

## 2017-10-21 MED ORDER — IOHEXOL 300 MG/ML  SOLN
100.0000 mL | Freq: Once | INTRAMUSCULAR | Status: AC | PRN
  Administered 2017-10-21: 100 mL via INTRAVENOUS

## 2017-10-21 MED ORDER — MORPHINE SULFATE (PF) 4 MG/ML IV SOLN
4.0000 mg | Freq: Once | INTRAVENOUS | Status: AC
  Administered 2017-10-21: 4 mg via INTRAVENOUS
  Filled 2017-10-21: qty 1

## 2017-10-21 MED ORDER — HYDROCODONE-ACETAMINOPHEN 5-325 MG PO TABS: 1.0000 | ORAL_TABLET | Freq: Four times a day (QID) | ORAL | 0 refills | Status: DC | PRN

## 2017-10-21 NOTE — ED Notes (Signed)
Upon ambulating to bathroom pt got very nauseous and started dry heaving. Will attempt to get urine after zofran has kicked in.

## 2017-10-21 NOTE — Discharge Instructions (Addendum)
The ultrasound showed evidence of what could possibly be hemorrhagic cyst.  This is likely the source of your pain.  However, there are other diagnoses that could not be excluded.  For this reason, a repeat ultrasound in 6 to 12 weeks is recommended, preferably in the week following your normal menstrual cycle.  This should be set up through OB/GYN.  You should also follow-up with OB/GYN on this matter regardless within the next week or two.  Your hemoglobin level today was lower than it has been in the past, however, we do not have a recent value with which to compare.  Due to this, you should have a repeat hemoglobin level checked within the next week.   Antiinflammatory medications: Take 600 mg of ibuprofen every 6 hours or 440 mg (over the counter dose) to 500 mg (prescription dose) of naproxen every 12 hours for the next 3 days. After this time, these medications may be used as needed for pain. Take these medications with food to avoid upset stomach. Choose only one of these medications, do not take them together. Tylenol: Should you continue to have additional pain while taking the ibuprofen or naproxen, you may add in tylenol as needed. Your daily total maximum amount of tylenol from all sources should be limited to 4000mg /day for persons without liver problems, or 2000mg /day for those with liver problems. Vicodin: May take Vicodin as needed for severe pain.  Do not drive or perform other dangerous activities while taking the Vicodin.  Please note that each pill of Vicodin contains 325 mg of Tylenol and the above dosage limits apply. Zofran: May use the Zofran, as needed, for nausea/vomiting.  Return to the ED for significantly worsening pain, persistent vomiting, abnormal vaginal bleeding with the pain, chest pain, shortness of breath, dizziness, or any other major concerns.

## 2017-10-21 NOTE — ED Triage Notes (Signed)
Pt reports she took her first dose of Duloxetine HCL yesterday (3pm)  and attributes her emesis and abdominal pain (started midnight).  Pt reports she has pain when she urinates and has bowel movements.
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
## 2017-10-21 NOTE — ED Notes (Signed)
Patient transported to CT 
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
## 2017-10-29 ENCOUNTER — Ambulatory Visit: Payer: Self-pay

## 2017-11-16 ENCOUNTER — Ambulatory Visit: Payer: Self-pay

## 2017-11-17 ENCOUNTER — Ambulatory Visit: Payer: Self-pay

## 2017-12-08 ENCOUNTER — Encounter: Payer: Self-pay | Admitting: Obstetrics and Gynecology

## 2017-12-11 ENCOUNTER — Ambulatory Visit: Payer: Self-pay | Admitting: Family Medicine

## 2018-01-21 ENCOUNTER — Other Ambulatory Visit: Payer: Self-pay | Admitting: Internal Medicine

## 2018-05-20 ENCOUNTER — Other Ambulatory Visit: Payer: Self-pay | Admitting: Internal Medicine

## 2018-05-20 DIAGNOSIS — R05 Cough: Secondary | ICD-10-CM

## 2018-05-20 DIAGNOSIS — R059 Cough, unspecified: Secondary | ICD-10-CM

## 2018-06-23 ENCOUNTER — Encounter: Payer: Self-pay | Admitting: Obstetrics & Gynecology

## 2018-07-15 ENCOUNTER — Ambulatory Visit: Payer: Self-pay | Admitting: Family Medicine

## 2018-07-28 IMAGING — CT CT ABD-PELV W/ CM
2 of 4 series · 16 of 46 positions shown, 18 images · IV contrast (Omni 300)
Comparison: 02/15/2009 CT abdomen and pelvis

CLINICAL DATA: 36 y/o F; abdominal pain, and discitis is suspected.

EXAM:
CT ABDOMEN AND PELVIS WITH CONTRAST
TECHNIQUE: Multidetector CT imaging of the abdomen and pelvis was performed
using the standard protocol following bolus administration of
intravenous contrast.
CONTRAST:  100mL OMNIPAQUE IOHEXOL 300 MG/ML  SOLN

[Series 3: a/p w/ 5mm · axial · 0.74mm/px · z∈[+882,+1317]mm · 13 of 95 slices shown, 15 images]
[im 4/95  soft-tissue]
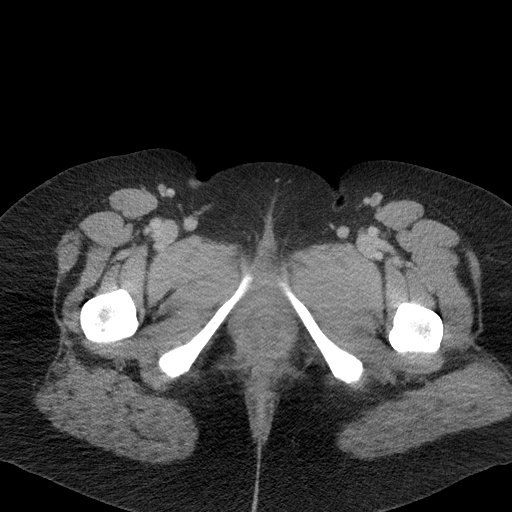
[im 4/95  bone]
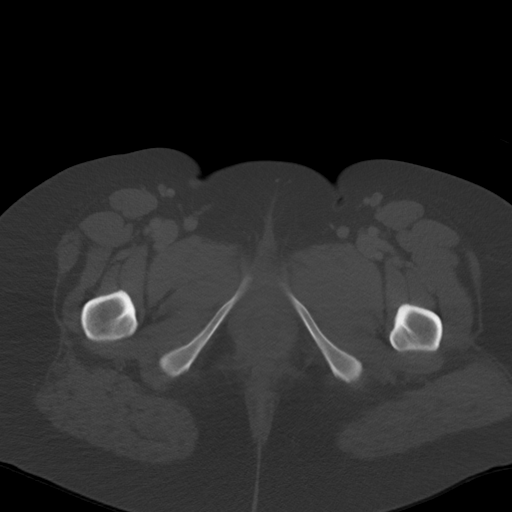
[im 12/95  soft-tissue]
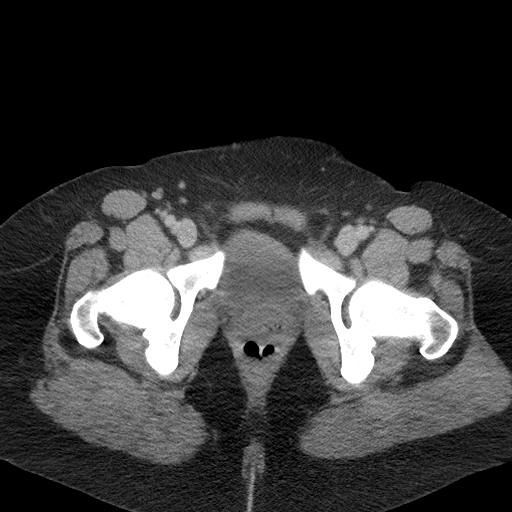
[im 19/95  soft-tissue]
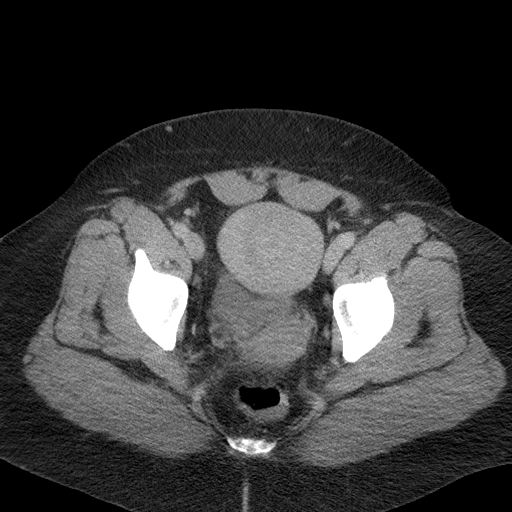
[im 27/95  soft-tissue]
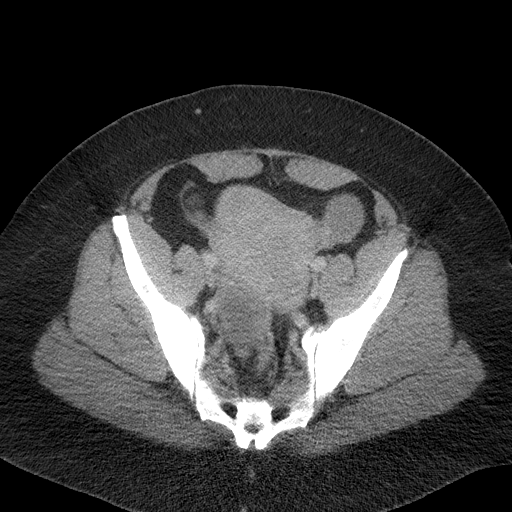
[im 34/95  soft-tissue]
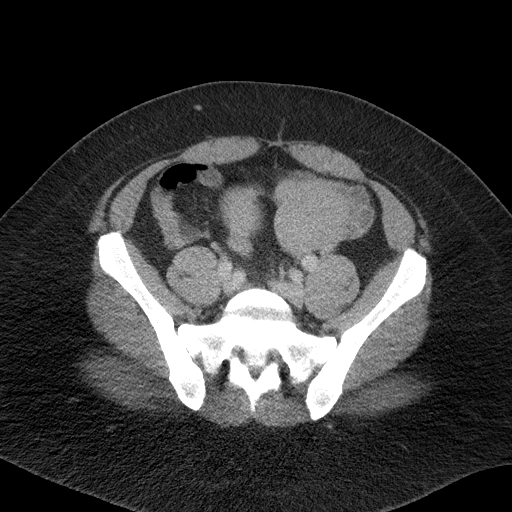
[im 42/95  soft-tissue]
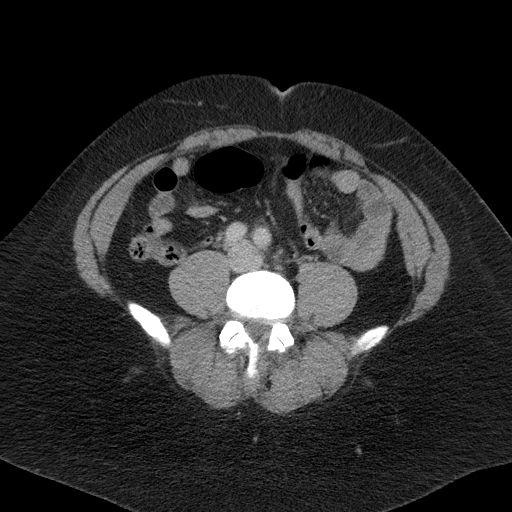
[im 49/95  soft-tissue]
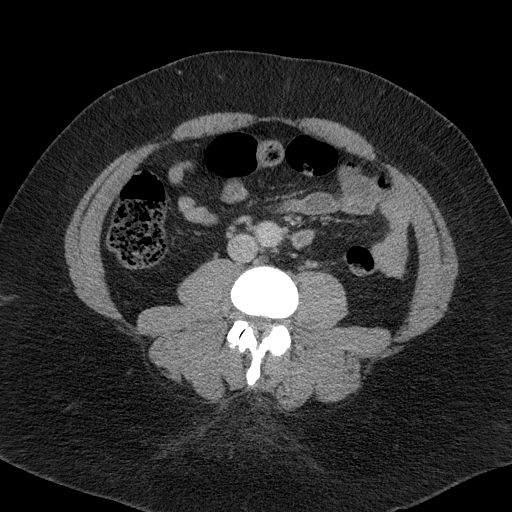
[im 53/95  soft-tissue]
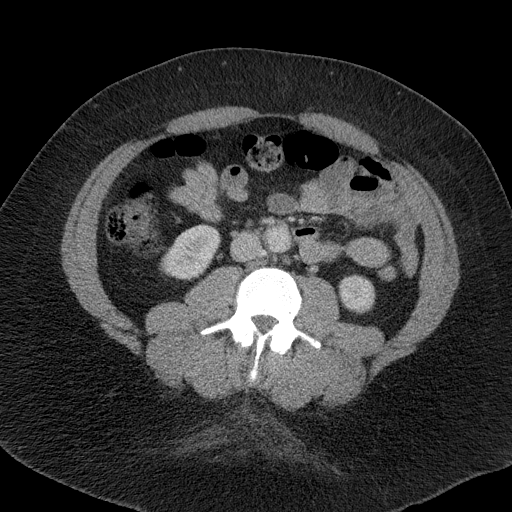
[im 61/95  soft-tissue]
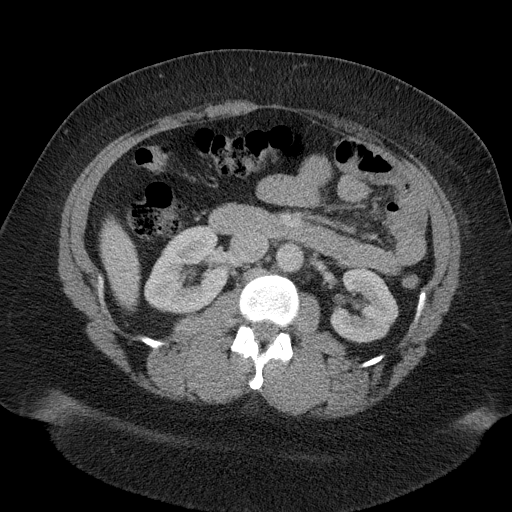
[im 61/95  bone]
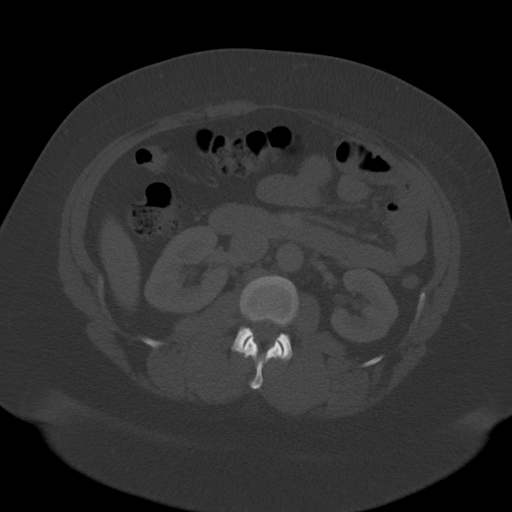
[im 68/95  soft-tissue]
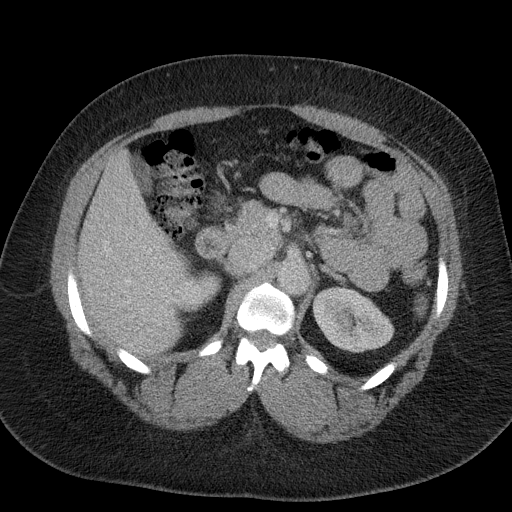
[im 76/95  soft-tissue]
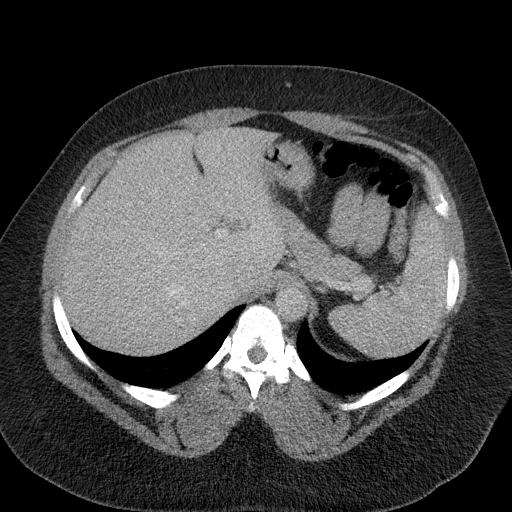
[im 83/95  soft-tissue]
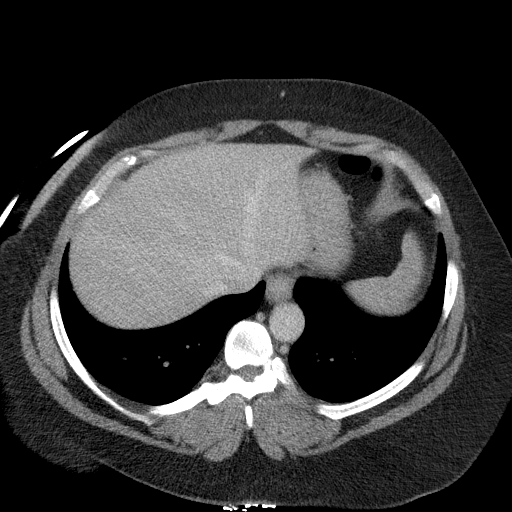
[im 91/95  soft-tissue]
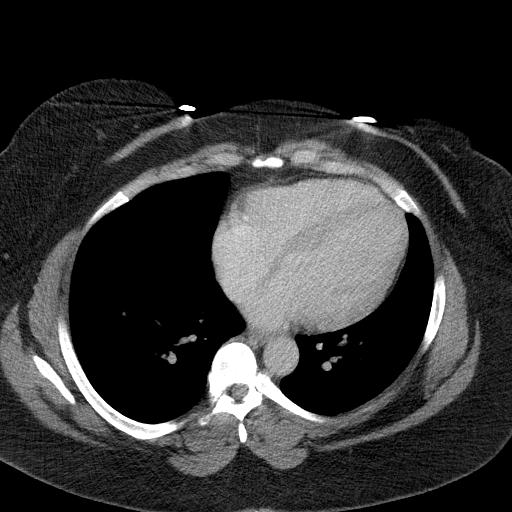

[Series 6: a/p w/ cor · coronal · 0.70mm/px · 3 of 151 slices shown]
[im 51/151  soft-tissue]
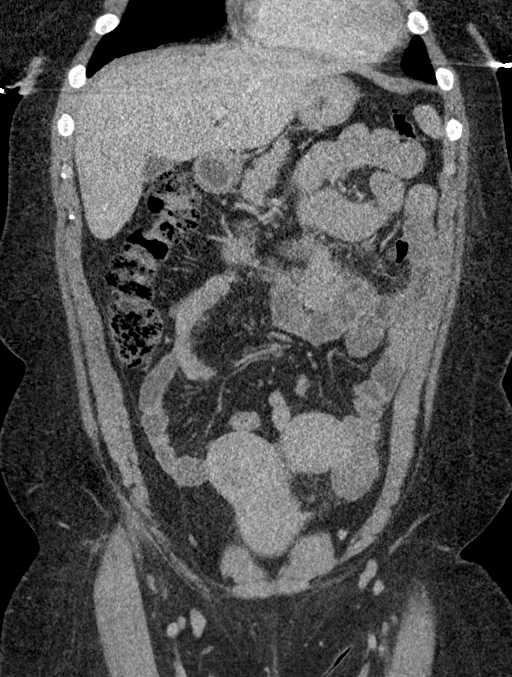
[im 67/151  soft-tissue]
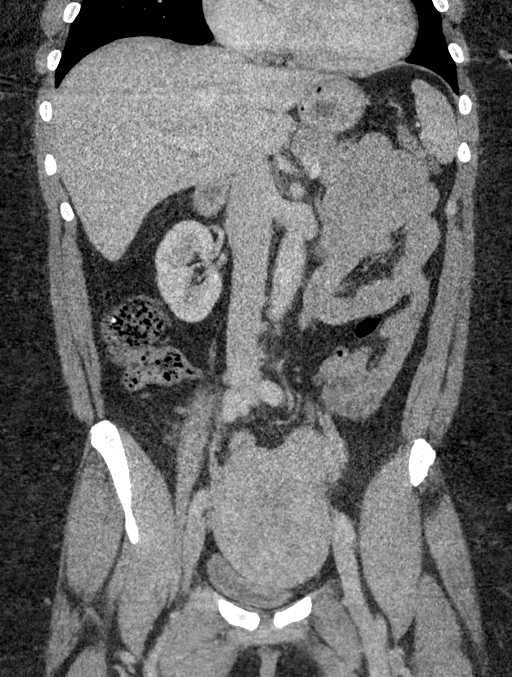
[im 84/151  soft-tissue]
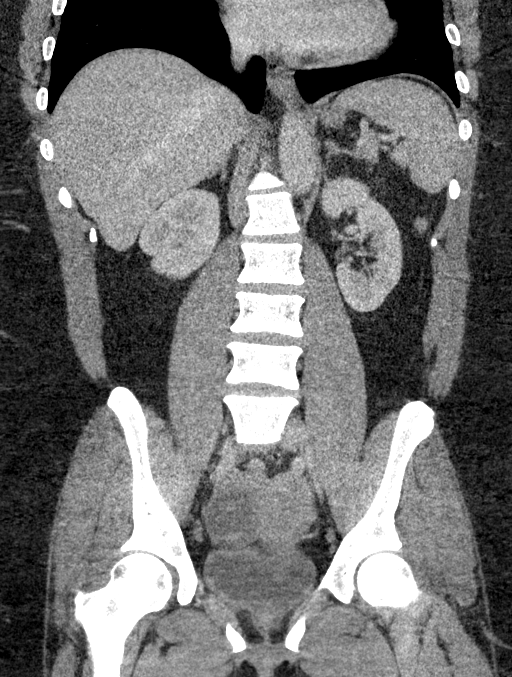

[16 of 46 positions shown; findings below may reference images not displayed]

FINDINGS: Lower chest: Stable left lower lobe 4 mm juxtapleural nodule, likely
intrapulmonary lymph node.

Hepatobiliary: No focal liver abnormality is seen. No gallstones,
gallbladder wall thickening, or biliary dilatation.

Pancreas: Unremarkable. No pancreatic ductal dilatation or
surrounding inflammatory changes.

Spleen: Normal in size without focal abnormality.

Adrenals/Urinary Tract: Adrenal glands are unremarkable. Kidneys are
normal, without renal calculi, focal lesion, or hydronephrosis.
Bladder is unremarkable.

Stomach/Bowel: Stomach is within normal limits. Appendix appears
normal. No evidence of bowel wall thickening, distention, or
inflammatory changes.

Vascular/Lymphatic: No significant vascular findings are present. No
enlarged abdominal or pelvic lymph nodes.

Reproductive: Multiple uterine myomas measuring up to 8 cm. Right
adnexal hypoattenuating mass measuring 6.6 x 4.4 x 4.6 cm.

Other: No abdominal wall hernia or abnormality. No abdominopelvic
ascites.

Musculoskeletal: No acute or significant osseous findings.
IMPRESSION: 1. Right adnexal hypoattenuating mass measuring up to 6.6 cm,
possibly ovarian cystic lesion or ovarian torsion. Pelvic ultrasound
is recommended.
2. Multiple uterine myomas measuring up to 8 cm.
3. Normal appendix.

These results were called by telephone at the time of interpretation
on 10/21/2017 at [DATE] to Dr. RANOLDO BEHARIE , who verbally
acknowledged these results.

1.

By: Elana Dellinger M.D.

## 2018-09-02 ENCOUNTER — Ambulatory Visit: Payer: Medicaid Other | Attending: Family Medicine | Admitting: Family Medicine

## 2018-09-02 ENCOUNTER — Other Ambulatory Visit: Payer: Self-pay

## 2018-09-21 ENCOUNTER — Other Ambulatory Visit: Payer: Self-pay | Admitting: Internal Medicine

## 2018-09-21 DIAGNOSIS — I10 Essential (primary) hypertension: Secondary | ICD-10-CM

## 2018-11-26 ENCOUNTER — Ambulatory Visit: Payer: Medicaid Other | Admitting: Internal Medicine

## 2019-01-13 ENCOUNTER — Ambulatory Visit: Payer: Medicaid Other | Admitting: Internal Medicine

## 2019-03-02 ENCOUNTER — Ambulatory Visit (HOSPITAL_COMMUNITY)
Admission: EM | Admit: 2019-03-02 | Discharge: 2019-03-02 | Discharge status: Against Medical Advice | Payer: Medicaid Other

## 2019-03-02 ENCOUNTER — Other Ambulatory Visit: Payer: Self-pay

## 2019-03-02 DIAGNOSIS — Z20828 Contact with and (suspected) exposure to other viral communicable diseases: Secondary | ICD-10-CM | POA: Diagnosis not present

## 2019-03-02 DIAGNOSIS — K624 Stenosis of anus and rectum: Secondary | ICD-10-CM

## 2019-03-02 DIAGNOSIS — Z20822 Contact with and (suspected) exposure to covid-19: Secondary | ICD-10-CM

## 2019-03-03 ENCOUNTER — Ambulatory Visit: Payer: Medicaid Other | Attending: Internal Medicine | Admitting: Internal Medicine

## 2019-03-03 ENCOUNTER — Other Ambulatory Visit: Payer: Self-pay

## 2019-03-03 DIAGNOSIS — M109 Gout, unspecified: Secondary | ICD-10-CM | POA: Diagnosis not present

## 2019-03-03 DIAGNOSIS — N838 Other noninflammatory disorders of ovary, fallopian tube and broad ligament: Secondary | ICD-10-CM

## 2019-03-03 DIAGNOSIS — N83201 Unspecified ovarian cyst, right side: Secondary | ICD-10-CM | POA: Insufficient documentation

## 2019-03-03 DIAGNOSIS — N839 Noninflammatory disorder of ovary, fallopian tube and broad ligament, unspecified: Secondary | ICD-10-CM | POA: Insufficient documentation

## 2019-03-03 DIAGNOSIS — N1831 Chronic kidney disease, stage 3a: Secondary | ICD-10-CM | POA: Insufficient documentation

## 2019-03-03 DIAGNOSIS — K529 Noninfective gastroenteritis and colitis, unspecified: Secondary | ICD-10-CM | POA: Diagnosis not present

## 2019-03-03 DIAGNOSIS — N946 Dysmenorrhea, unspecified: Secondary | ICD-10-CM | POA: Insufficient documentation

## 2019-03-03 DIAGNOSIS — Z2821 Immunization not carried out because of patient refusal: Secondary | ICD-10-CM

## 2019-03-03 DIAGNOSIS — G8929 Other chronic pain: Secondary | ICD-10-CM | POA: Insufficient documentation

## 2019-03-03 DIAGNOSIS — I10 Essential (primary) hypertension: Secondary | ICD-10-CM | POA: Diagnosis not present

## 2019-03-03 DIAGNOSIS — E785 Hyperlipidemia, unspecified: Secondary | ICD-10-CM

## 2019-03-03 DIAGNOSIS — E669 Obesity, unspecified: Secondary | ICD-10-CM | POA: Insufficient documentation

## 2019-03-03 DIAGNOSIS — Z791 Long term (current) use of non-steroidal anti-inflammatories (NSAID): Secondary | ICD-10-CM | POA: Diagnosis not present

## 2019-03-03 DIAGNOSIS — Z79899 Other long term (current) drug therapy: Secondary | ICD-10-CM | POA: Insufficient documentation

## 2019-03-03 DIAGNOSIS — F17200 Nicotine dependence, unspecified, uncomplicated: Secondary | ICD-10-CM | POA: Diagnosis not present

## 2019-03-03 DIAGNOSIS — G43829 Menstrual migraine, not intractable, without status migrainosus: Secondary | ICD-10-CM | POA: Diagnosis not present

## 2019-03-03 DIAGNOSIS — F17210 Nicotine dependence, cigarettes, uncomplicated: Secondary | ICD-10-CM

## 2019-03-03 DIAGNOSIS — I129 Hypertensive chronic kidney disease with stage 1 through stage 4 chronic kidney disease, or unspecified chronic kidney disease: Secondary | ICD-10-CM | POA: Diagnosis not present

## 2019-03-03 DIAGNOSIS — N920 Excessive and frequent menstruation with regular cycle: Secondary | ICD-10-CM | POA: Diagnosis not present

## 2019-03-03 DIAGNOSIS — Z131 Encounter for screening for diabetes mellitus: Secondary | ICD-10-CM

## 2019-03-03 MED ORDER — ATORVASTATIN CALCIUM 40 MG PO TABS
40.0000 mg | ORAL_TABLET | Freq: Every day | ORAL | 2 refills | Status: DC
Start: 2019-03-03 — End: 2019-06-20

## 2019-03-03 MED ORDER — CHANTIX STARTING MONTH PAK 0.5 MG X 11 & 1 MG X 42 PO TABS: ORAL_TABLET | ORAL | 0 refills | Status: DC

## 2019-03-03 MED ORDER — CATAPRES-TTS-1 0.1 MG/24HR TD PTWK: 0.1000 mg | MEDICATED_PATCH | TRANSDERMAL | 11 refills | Status: DC

## 2019-03-03 MED ORDER — LISINOPRIL 10 MG PO TABS: 10.0000 mg | ORAL_TABLET | Freq: Every day | ORAL | 6 refills | Status: DC

## 2019-03-03 MED ORDER — AMLODIPINE BESYLATE 10 MG PO TABS: ORAL_TABLET | ORAL | 11 refills | Status: DC

## 2019-03-03 NOTE — Progress Notes (Addendum)
Virtual Visit via Telephone Note Due to current restrictions/limitations of in-office visits due to the COVID-19 pandemic, this scheduled clinical appointment was converted to a telehealth visit  I connected with Jeanette Gamble on 03/03/19 at 8:58 a.m by telephone and verified that I am speaking with the correct person using two identifiers. I am in my office.  The patient is at home.  Only the patient and myself participated in this encounter.  I discussed the limitations, risks, security and privacy concerns of performing an evaluation and management service by telephone and the availability of in person appointments. I also discussed with the patient that there may be a patient responsible charge related to this service. The patient expressed understanding and agreed to proceed.   History of Present Illness: This is a new patient visit.  Previous provider was at [facility]. Pt dismissed from the practice due to missed appts. Patient with history of HTN, HL, obesity, tob dep, CKD 3, complex mass right ovary as seen on ultrasound 10/2017, chronic back pain  HTN: reports compliance with meds. However, I noted that her RFs expired in the system. Pt them admitted that she has been out of meds for over 6-8 mths.  Previously she was on amlodipine, clonidine patch, lisinopril 40 mg daily. Admits to HA since being out of meds.  Checked BP about 1 wk ago and SBP in the 200s so she started taking some of her mother's Lisinopril Review of chemistries reveal that she may have CKD with eGFR ranging from 45-71.  .  Tob Dep:  Smokes 1/2 pk a day.  Smoked since age 2.  Quit cold [place] b/w 2003-2005.  Restarted due to stress of change in financial situation.  Would like to give a trial of quitting again.  Request RF Colchicine which she takes for gout  C/o diarrhea, cramping and vomiting x 2 days.  Started after "I ate some bad sea food." Diarrhea several times a day. No  blood in stools or fever.   Not taking anything for her symptoms.  Reports no diarrhea or vomiting since yesterday but some nausea.  C/o heavy, painful menses and requesting pain medication.  Menses were not as heavy when she was younger. Menses last 6 days.  She has to wear Depends.  Changes Depends about 3 x a day.  Last CBC reveals that she has normocytic anemia.   Gets migraine at start of menses.  Takes Midol but does not seem to help any more.  I see that she had a pelvic ultrasound done last year that revealed fibroids in the uterus and a complex right ovarian/adnexal mass.  Follow-up ultrasound in 6 to 12 weeks was recommended.  However patient states she was not aware that she had a cyst or mass on the ovary.  Outpatient Encounter Medications as of 03/03/2019  Medication Sig  . amLODipine (NORVASC) 10 MG tablet TAKE 1 TABLET (10 MG TOTAL) BY MOUTH DAILY.  Marland Kitchen atorvastatin (LIPITOR) 40 MG tablet Take 1 tablet (40 mg total) by mouth daily.  . baclofen (LIORESAL) 10 MG tablet TAKE 1/2 TABLET ( 5 MG ) BY MOUTH AT BEDTIME FOR 14 DAYS (Patient not taking: Reported on 10/21/2017)  . CATAPRES-TTS-1 0.1 MG/24HR patch PLACE 1 PATCH (0.1 MG TOTAL) ONTO THE SKIN EVERY 7 (SEVEN) DAYS.  Marland Kitchen colchicine 0.6 MG tablet Take 1 tablet (0.6 mg total) by mouth daily. Start with 2 tablet and then take 1 tablet twice daily.  Marland Kitchen  diclofenac sodium (VOLTAREN) 1 % GEL Apply 2 g topically 4 (four) times daily.  . DULoxetine (CYMBALTA) 30 MG capsule Take 1 capsule (30 mg total) by mouth daily for 7 days, THEN 2 capsules (60 mg total) daily.  . fluticasone (FLONASE) 50 MCG/ACT nasal spray USE TWO SPRAYS INTO EACH NOSTRIL DAILY  . HYDROcodone-acetaminophen (NORCO/VICODIN) 5-325 MG tablet Take 1-2 tablets by mouth every 6 (six) hours as needed for severe pain.  Marland Kitchen lisinopril (PRINIVIL,ZESTRIL) 40 MG tablet TAKE 1 TABLET (40 MG TOTAL) BY MOUTH DAILY.  Marland Kitchen ondansetron (ZOFRAN ODT) 4 MG disintegrating tablet Take 1 tablet (4 mg  total) by mouth every 8 (eight) hours as needed for nausea or vomiting.  . ondansetron (ZOFRAN) 4 MG tablet Take 1 tablet (4 mg total) every 8 (eight) hours as needed by mouth for nausea or vomiting. (Patient not taking: Reported on 10/21/2017)  . ranitidine (ZANTAC) 150 MG tablet Take 150 mg by mouth as needed for heartburn.  . sertraline (ZOLOFT) 25 MG tablet Take 1 tablet (25 mg total) by mouth daily for 7 days.  . traMADol (ULTRAM) 50 MG tablet Take 1 tablet (50 mg total) every 4 (four) hours as needed by mouth for severe pain. (Patient not taking: Reported on 10/21/2017)  . Vitamin D, Cholecalciferol, 400 units CAPS Take 800 mg by mouth daily. (Patient not taking: Reported on 10/21/2017)   No facility-administered encounter medications on file as of 03/03/2019.       Observations/Objective: No direct observation done as this was a telephone encounter. Lab Results  Component Value Date   WBC 8.8 10/21/2017   HGB 9.4 (L) 10/21/2017   HCT 31.6 (L) 10/21/2017   MCV 85.9 10/21/2017   PLT 221 10/21/2017     Chemistry      Component Value Date/Time   NA 138 10/21/2017 0450   NA 140 12/02/2016 1331   K 3.8 10/21/2017 0450   CL 110 10/21/2017 0450   CO2 23 10/21/2017 0450   BUN 20 10/21/2017 0450   BUN 14 12/02/2016 1331   CREATININE 1.37 (H) 10/21/2017 0450   CREATININE 1.09 10/24/2014 1031      Component Value Date/Time   CALCIUM 8.6 (L) 10/21/2017 0450   ALKPHOS 63 10/21/2017 0450   AST 18 10/21/2017 0450   ALT 17 10/21/2017 0450   BILITOT 0.3 10/21/2017 0450       Assessment and Plan: 1. Essential hypertension Restart amlodipine, clonidine patch and lisinopril.  However I have restarted lisinopril at 10 mg given she has been off of this for over  6 months.  DASH diet discussed and encouraged. - CBC; Future - Comprehensive metabolic panel; Future - amLODipine (NORVASC) 10 MG tablet; TAKE 1 TABLET (10 MG TOTAL) BY MOUTH DAILY.  Dispense: 30 tablet; Refill: 11 - lisinopril  (ZESTRIL) 10 MG tablet; Take 1 tablet (10 mg total) by mouth daily.  Dispense: 30 tablet; Refill: 6 - CATAPRES-TTS-1 0.1 MG/24HR patch; Place 1 patch (0.1 mg total) onto the skin once a week.  Dispense: 4 patch; Refill: 11  2. Tobacco dependence Patient advised to quit smoking. Discussed health risks associated with smoking including lung and other types of cancers, chronic lung diseases and CV risks.. Pt ready to give trail of quitting.  Discussed methods to help quit including quitting cold [place], use of NRT, Chantix and Bupropion.  Patient wanting to try Chantix.  I went over the possible side effects of Chantix including mood swings and bad dreams.  Less than 5  minutes spent on counseling. - varenicline (CHANTIX STARTING MONTH PAK) 0.5 MG X 11 & 1 MG X 42 tablet; Take one 0.5 mg tablet by mouth once daily for 3 days, then increase to one 0.5 mg tablet twice daily for 4 days, then increase to one 1 mg tablet twice daily.  Dispense: 53 tablet; Refill: 0  3. Acute gastroenteritis Symptoms consistent with acute gastroenteritis likely viral.  Recommend pushing fluids and taking Pepto-Bismol  4. Influenza vaccination declined Patient declines flu vaccine  5. MENORRHAGIA 6. Dysmenorrhea I recommend taking Tylenol 500 mg as needed for pain. Check CBC and iron studies. - Ambulatory referral to Gynecology - US Pelvic Complete With Transvaginal; Future  7. Ovarian mass, right - Ambulatory referral to Gynecology - US Pelvic Complete With Transvaginal; Future  8. Stage 3a chronic kidney disease Patient will come to the lab to have blood test done.  Advised to avoid taking NSAIDs.  9. Menstrual migraine without status migrainosus, not intractable We will prescribe some Fioricet to use as needed. Will not use Imitrex at this time due to elev BP  10. Hyperlipidemia, unspecified hyperlipidemia type - Lipid panel; Future - atorvastatin (LIPITOR) 40 MG tablet; Take 1 tablet (40 mg total) by mouth  daily.  Dispense: 30 tablet; Refill: 2  11. Diabetes mellitus screening - Hemoglobin A1c; Future   Follow Up Instructions: Follow-up in 4 weeks in person.   I discussed the assessment and treatment plan with the patient. The patient was provided an opportunity to ask questions and all were answered. The patient agreed with the plan and demonstrated an understanding of the instructions.   The patient was advised to call back or seek an in-person evaluation if the symptoms worsen or if the condition fails to improve as anticipated.  I provided 27 minutes of non-face-to-face time during this encounter.   Karle Plumber, MD

## 2019-03-04 ENCOUNTER — Other Ambulatory Visit: Payer: Medicaid Other

## 2019-03-04 ENCOUNTER — Telehealth: Payer: Self-pay

## 2019-03-04 ENCOUNTER — Encounter: Payer: Self-pay | Admitting: Internal Medicine

## 2019-03-04 ENCOUNTER — Other Ambulatory Visit: Payer: Self-pay

## 2019-03-04 MED ORDER — BUTALBITAL-APAP-CAFFEINE 50-325-40 MG PO TABS: ORAL_TABLET | ORAL | 1 refills | Status: DC

## 2019-03-04 NOTE — Telephone Encounter (Signed)
Went on the [employer] and did prior auth for [place]   CPT- [id number] Ultrasound, pelvic (nonobstetric), B-scan and/or real time with image documentation, complete           76830 Ultrasound, transvaginal  ICD- N83.8 Other noninflammatory disorders of ovary, fallopian tube and broad ligament          N94.6 Dysmenorrhea, unspecified    Authorization [id number] Effective-03/04/2019 Expires-08/31/2019

## 2019-03-05 LAB — NOVEL CORONAVIRUS, NAA: SARS-CoV-2, NAA: NOT DETECTED

## 2019-03-08 ENCOUNTER — Telehealth: Payer: Self-pay | Admitting: Internal Medicine

## 2019-03-08 NOTE — Telephone Encounter (Signed)
Patient called in and is requesting COVID-19 results be faxed. Pt will be calling back with number for employer to have results faxed to.

## 2019-03-09 ENCOUNTER — Other Ambulatory Visit: Payer: Medicaid Other

## 2019-03-11 ENCOUNTER — Other Ambulatory Visit: Payer: Medicaid Other

## 2019-03-18 ENCOUNTER — Ambulatory Visit (HOSPITAL_COMMUNITY): Admission: RE | Admit: 2019-03-18 | Payer: Medicaid Other | Source: Ambulatory Visit

## 2019-03-24 ENCOUNTER — Other Ambulatory Visit: Payer: Self-pay

## 2019-03-24 ENCOUNTER — Ambulatory Visit (HOSPITAL_COMMUNITY)
Admission: RE | Admit: 2019-03-24 | Discharge: 2019-03-24 | Disposition: A | Discharge status: Home / Self Care | Payer: Medicaid Other | Source: Ambulatory Visit | Attending: Internal Medicine | Admitting: Internal Medicine

## 2019-03-24 DIAGNOSIS — N946 Dysmenorrhea, unspecified: Secondary | ICD-10-CM | POA: Diagnosis not present

## 2019-03-24 DIAGNOSIS — D259 Leiomyoma of uterus, unspecified: Secondary | ICD-10-CM | POA: Diagnosis not present

## 2019-03-24 DIAGNOSIS — N838 Other noninflammatory disorders of ovary, fallopian tube and broad ligament: Secondary | ICD-10-CM | POA: Diagnosis not present

## 2019-03-28 ENCOUNTER — Telehealth: Payer: Self-pay

## 2019-03-28 NOTE — Telephone Encounter (Signed)
Contacted pt to go over US results pt is aware and doesn't have any questions or concerns  

## 2019-03-29 ENCOUNTER — Ambulatory Visit: Payer: Medicaid Other | Attending: Internal Medicine

## 2019-03-29 ENCOUNTER — Other Ambulatory Visit: Payer: Self-pay

## 2019-03-29 DIAGNOSIS — N920 Excessive and frequent menstruation with regular cycle: Secondary | ICD-10-CM | POA: Diagnosis not present

## 2019-03-29 DIAGNOSIS — E785 Hyperlipidemia, unspecified: Secondary | ICD-10-CM

## 2019-03-29 DIAGNOSIS — Z131 Encounter for screening for diabetes mellitus: Secondary | ICD-10-CM | POA: Diagnosis not present

## 2019-03-29 DIAGNOSIS — I10 Essential (primary) hypertension: Secondary | ICD-10-CM

## 2019-03-30 ENCOUNTER — Telehealth: Payer: Self-pay | Admitting: Internal Medicine

## 2019-03-30 ENCOUNTER — Other Ambulatory Visit: Payer: Self-pay | Admitting: Internal Medicine

## 2019-03-30 LAB — LIPID PANEL
Chol/HDL Ratio: 2.1 ratio (ref 0.0–4.4)
Cholesterol, Total: 124 mg/dL (ref 100–199)
HDL: 58 mg/dL (ref >39)
LDL Chol Calc (NIH): 54 mg/dL (ref 0–99)
Triglycerides: 55 mg/dL (ref 0–149)
VLDL Cholesterol Cal: 12 mg/dL (ref 5–40)

## 2019-03-30 LAB — CBC
Hematocrit: 27.5 % — ABNORMAL LOW (ref 34.0–46.6)
Hemoglobin: 8.2 g/dL — ABNORMAL LOW (ref 11.1–15.9)
MCH: 21.4 pg — ABNORMAL LOW (ref 26.6–33.0)
MCHC: 29.8 g/dL — ABNORMAL LOW (ref 31.5–35.7)
MCV: 72 fL — ABNORMAL LOW (ref 79–97)
Platelets: 283 x10E3/uL (ref 150–450)
RBC: 3.84 x10E6/uL (ref 3.77–5.28)
RDW: 16.3 % — ABNORMAL HIGH (ref 11.7–15.4)
WBC: 7.8 x10E3/uL (ref 3.4–10.8)

## 2019-03-30 LAB — COMPREHENSIVE METABOLIC PANEL
ALT: 13 IU/L (ref 0–32)
AST: 13 IU/L (ref 0–40)
Albumin/Globulin Ratio: 1.2 (ref 1.2–2.2)
Albumin: 4 g/dL (ref 3.8–4.8)
Alkaline Phosphatase: 69 IU/L (ref 39–117)
BUN/Creatinine Ratio: 16 (ref 9–23)
BUN: 19 mg/dL (ref 6–20)
Bilirubin Total: 0.4 mg/dL (ref 0.0–1.2)
CO2: 20 mmol/L (ref 20–29)
Calcium: 8.8 mg/dL (ref 8.7–10.2)
Chloride: 106 mmol/L (ref 96–106)
Creatinine, Ser: 1.18 mg/dL — ABNORMAL HIGH (ref 0.57–1.00)
GFR calc Af Amer: 67 mL/min/{1.73_m2} (ref >59)
GFR calc non Af Amer: 58 mL/min/{1.73_m2} — ABNORMAL LOW (ref >59)
Globulin, Total: 3.3 g/dL (ref 1.5–4.5)
Glucose: 85 mg/dL (ref 65–99)
Potassium: 4.7 mmol/L (ref 3.5–5.2)
Sodium: 139 mmol/L (ref 134–144)
Total Protein: 7.3 g/dL (ref 6.0–8.5)

## 2019-03-30 LAB — IRON,TIBC AND FERRITIN PANEL
Ferritin: 6 ng/mL — ABNORMAL LOW (ref 15–150)
Iron Saturation: 6 % — CL (ref 15–55)
Iron: 20 ug/dL — ABNORMAL LOW (ref 27–159)
Total Iron Binding Capacity: 363 ug/dL (ref 250–450)
UIBC: 343 ug/dL (ref 131–425)

## 2019-03-30 LAB — HEMOGLOBIN A1C
Est. average glucose Bld gHb Est-mCnc: 111 mg/dL
Hgb A1c MFr Bld: 5.5 % (ref 4.8–5.6)

## 2019-03-30 MED ORDER — FERROUS SULFATE 325 (65 FE) MG PO TABS: 325.0000 mg | ORAL_TABLET | Freq: Two times a day (BID) | ORAL | 1 refills | Status: DC

## 2019-03-30 NOTE — Telephone Encounter (Signed)
-----   Message from Ena Dawley sent at 03/29/2019  1:03 PM EST ----- Good Afternoon Dr Wynetta Emery  Patient has an appointment   04-13-2019 @ 10:35 am  at [facility]    ----- Message ----- From: Ladell Pier, MD Sent: 03/27/2019  12:02 PM EST To: Ena Dawley  Any appointment with GYN as yet?

## 2019-04-13 ENCOUNTER — Encounter: Payer: Medicaid Other | Admitting: Obstetrics and Gynecology

## 2019-04-13 ENCOUNTER — Encounter: Payer: Self-pay | Admitting: Obstetrics and Gynecology

## 2019-04-13 NOTE — Progress Notes (Signed)
Patient did not keep her OB appointment for 04/13/2019.  Durene Romans MD Attending Center for [employer])

## 2019-04-21 ENCOUNTER — Other Ambulatory Visit: Payer: Self-pay | Admitting: Internal Medicine

## 2019-04-21 DIAGNOSIS — G43829 Menstrual migraine, not intractable, without status migrainosus: Secondary | ICD-10-CM

## 2019-04-25 ENCOUNTER — Ambulatory Visit: Payer: Medicaid Other | Admitting: Internal Medicine

## 2019-05-27 ENCOUNTER — Other Ambulatory Visit: Payer: Self-pay

## 2019-05-27 ENCOUNTER — Encounter: Payer: Self-pay | Admitting: Internal Medicine

## 2019-05-27 ENCOUNTER — Ambulatory Visit: Payer: Medicaid Other | Attending: Internal Medicine | Admitting: Internal Medicine

## 2019-05-27 VITALS — BP 166/109 | HR 80 | Temp 97.5°F | Resp 16 | Wt 244.5 lb

## 2019-05-27 DIAGNOSIS — D219 Benign neoplasm of connective and other soft tissue, unspecified: Secondary | ICD-10-CM

## 2019-05-27 DIAGNOSIS — F17210 Nicotine dependence, cigarettes, uncomplicated: Secondary | ICD-10-CM | POA: Diagnosis not present

## 2019-05-27 DIAGNOSIS — G43909 Migraine, unspecified, not intractable, without status migrainosus: Secondary | ICD-10-CM | POA: Diagnosis not present

## 2019-05-27 DIAGNOSIS — N920 Excessive and frequent menstruation with regular cycle: Secondary | ICD-10-CM | POA: Insufficient documentation

## 2019-05-27 DIAGNOSIS — F329 Major depressive disorder, single episode, unspecified: Secondary | ICD-10-CM | POA: Insufficient documentation

## 2019-05-27 DIAGNOSIS — E785 Hyperlipidemia, unspecified: Secondary | ICD-10-CM | POA: Insufficient documentation

## 2019-05-27 DIAGNOSIS — F17200 Nicotine dependence, unspecified, uncomplicated: Secondary | ICD-10-CM

## 2019-05-27 DIAGNOSIS — I129 Hypertensive chronic kidney disease with stage 1 through stage 4 chronic kidney disease, or unspecified chronic kidney disease: Secondary | ICD-10-CM | POA: Diagnosis not present

## 2019-05-27 DIAGNOSIS — N1831 Chronic kidney disease, stage 3a: Secondary | ICD-10-CM | POA: Insufficient documentation

## 2019-05-27 DIAGNOSIS — D259 Leiomyoma of uterus, unspecified: Secondary | ICD-10-CM | POA: Insufficient documentation

## 2019-05-27 DIAGNOSIS — L720 Epidermal cyst: Secondary | ICD-10-CM | POA: Insufficient documentation

## 2019-05-27 DIAGNOSIS — I10 Essential (primary) hypertension: Secondary | ICD-10-CM | POA: Diagnosis present

## 2019-05-27 DIAGNOSIS — D509 Iron deficiency anemia, unspecified: Secondary | ICD-10-CM

## 2019-05-27 DIAGNOSIS — I700 Atherosclerosis of aorta: Secondary | ICD-10-CM | POA: Diagnosis not present

## 2019-05-27 DIAGNOSIS — K219 Gastro-esophageal reflux disease without esophagitis: Secondary | ICD-10-CM | POA: Insufficient documentation

## 2019-05-27 DIAGNOSIS — M25511 Pain in right shoulder: Secondary | ICD-10-CM

## 2019-05-27 DIAGNOSIS — G8929 Other chronic pain: Secondary | ICD-10-CM

## 2019-05-27 DIAGNOSIS — M545 Low back pain, unspecified: Secondary | ICD-10-CM

## 2019-05-27 DIAGNOSIS — Z79899 Other long term (current) drug therapy: Secondary | ICD-10-CM | POA: Insufficient documentation

## 2019-05-27 MED ORDER — OMEPRAZOLE 20 MG PO CPDR: 20.0000 mg | DELAYED_RELEASE_CAPSULE | Freq: Every day | ORAL | 3 refills | Status: DC

## 2019-05-27 MED ORDER — DICLOFENAC SODIUM 1 % EX GEL: 2.0000 g | Freq: Four times a day (QID) | CUTANEOUS | 1 refills | Status: DC

## 2019-05-27 MED ORDER — METHOCARBAMOL 500 MG PO TABS: 500.0000 mg | ORAL_TABLET | Freq: Every day | ORAL | 1 refills | Status: DC | PRN

## 2019-05-27 NOTE — Progress Notes (Signed)
Pt states she is having some lower back pain  Pt states she has been having pain in right arm and yesterday she had a shooting pain

## 2019-05-27 NOTE — Patient Instructions (Addendum)
Give patient a follow-up appointment with the clinical pharmacist in 2 weeks for repeat blood pressure check.  You should call your pharmacy to get a refill on the clonidine patch.  Try to check your blood pressure at least twice a week.  Please write down your numbers and bring them in in 2 weeks when you come to see the clinical pharmacist for repeat blood pressure check.  You can go to the radiology department at [facility] to have your x-rays done.  I have prescribed a muscle relaxant and an anti-inflammatory pain rub for you to use as needed.  I have sent a prescription called omeprazole for you to take for the heartburn.  We can discuss the heartburn in more detail on your next visit.

## 2019-05-27 NOTE — Progress Notes (Signed)
Patient ID: Jeanette Gamble, female    DOB: 05-28-1980  MRN: [id number]  CC: Follow-up (4 week )   Subjective: Jeanette Gamble is a 39 y.o. female who presents for  Chronic ds management.  First visit with me was 02/2019 to establish care Her concerns today include:  Patient with history of HTN, HL, obesity, tob dep, CKD 3, complex mass right ovary as seen on ultrasound 10/2017, chronic back pain  Last eval 02/2019  HTN: On last visit she admitted that she had been out of all 3 of her medications for 6-8 months.  Refills were given.  She tells me that she was taking them consistently but ran out of the clonidine patch last week.  Of note she still has refills on the prescription.  She states that her pharmacy delivers her medications and she thinks they forgot to include it in the last delivery.  She plans to call them today.  Reports compliance with amlodipine and lisinopril.  Denies any headaches.  No lower extremity edema Checks BP 1-2 x a wk.  Reports SBP 160s/100-120s Limits salt in foods GFR improved from 56 in July 2000 19-67 on recent blood test done 03/2019.  Menorrhagia/Dysmenorrhea: Pelvic ultrasound revealed fibroids and thickened endometrial lining.  She has appt with GYN later this mth.  CBC revealed a hemoglobin of 8.2 with MCV of 72.  Iron studies consistent with iron deficiency anemia.  I prescribed iron tablets.  She did not start them as yet again stating that her pharmacy did not deliver.  She endorses some dizziness and fatigue.  Tob Dep:  Did get Chantix but has not started as yet.  Had 2 close deaths in the family and this threw her off and felt a bit stressed.  She will start the Chantix when she feels she is ready to give a trial of quitting  HL:  Taking and tolerating Atorvastatin  Migraine: Reports good relief with Fioricet as abortive therapy.   C/o chronic mid to lower back pain and right shoulder pain.  She points to the mid thoracic to upper lumbar region.   Pain started in 2016 when she injured herself at work.  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  She fell landing on her buttock and hitting her right shoulder against the side of the truck.  She felt that "her spine was pushed up" when she hit the ground.  She saw an orthopedic specialist at the time who told her that she had little scoliosis in the lower back. -Since then she reports that the pain in her lower back has gotten a little worse.  There is no radiation of pain down the legs.  No numbness or tingling.  It hurts to sit or stand for long periods.  It is also worse when she tries to lift anything.  She has noticed a knot in the lower back on the right side.  She sometimes gets sharp shooting pains from this not.  It has been there a while and has not increased in size. -She also has chronic pain in the posterior aspect of the right shoulder over the shoulder blade related to this work injury. -She tells me that she tried various over-the-counter medications but nothing seems to help.  When she takes Tylenol she has to take 3-4 at a time but even that strategy does not work well.  Requesting something  for pain. -She was in the process of trying to apply for disability last yr. currently still working. Patient Active Problem List   Diagnosis Date Noted  . Tobacco dependence 03/03/2019  . Dysmenorrhea 03/03/2019  . Ovarian mass, right 03/03/2019  . Stage 3a chronic kidney disease 03/03/2019  . Menstrual migraine without status migrainosus, not intractable 03/03/2019  . Hyperlipidemia 03/03/2019  . Right arm pain 10/13/2017  . Chronic pain 10/13/2017  . Effusion of bursa of right knee 06/04/2017  . Muscle spasm of back 05/26/2017  . Chronic back pain 11/06/2016  . Palpitations 01/09/2016  . Vitamin D deficiency 12/04/2015  . Atherosclerosis of aortic arch ([place]) 12/04/2015  . Health care maintenance 02/20/2012  . Chronic gout  involving toe of left 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 02/18/2012  . GERD (gastroesophageal reflux disease) 03/24/2011  . Major depressive disorder 04/02/2010  . MENORRHAGIA 06/21/2009  . OBESITY, MORBID 02/14/2009  . RISK OF SLEEP APNEA 04/12/2007  . SMOKER 02/04/2006  . Essential hypertension 02/04/2006  . OVARIAN CYST 02/04/2006     Current Outpatient Medications on File Prior to Visit  Medication Sig Dispense Refill  . amLODipine (NORVASC) 10 MG tablet TAKE 1 TABLET (10 MG TOTAL) BY MOUTH DAILY. 30 tablet 11  . atorvastatin (LIPITOR) 40 MG tablet Take 1 tablet (40 mg total) by mouth daily. 30 tablet 2  . butalbital-acetaminophen-caffeine (FIORICET) 50-325-40 MG tablet TAKE 1 TABLET BY MOUTH EVERY 6 TO 8 HOURS AS NEEDED FOR HEADACHE. 30 tablet 0  . CATAPRES-TTS-1 0.1 MG/24HR patch Place 1 patch (0.1 mg total) onto the skin once a week. 4 patch 11  . colchicine 0.6 MG tablet Take 1 tablet (0.6 mg total) by mouth daily. Start with 2 tablet and then take 1 tablet twice daily. 30 tablet 0  . ferrous sulfate 325 (65 FE) MG tablet Take 1 tablet (325 mg total) by mouth 2 (two) times daily with a meal. 120 tablet 1  . lisinopril (ZESTRIL) 10 MG tablet Take 1 tablet (10 mg total) by mouth daily. 30 tablet 6  . varenicline (CHANTIX STARTING MONTH PAK) 0.5 MG X 11 & 1 MG X 42 tablet Take one 0.5 mg tablet by mouth once daily for 3 days, then increase to one 0.5 mg tablet twice daily for 4 days, then increase to one 1 mg tablet twice daily. 53 tablet 0   No current facility-administered medications on file prior to visit.    No Known Allergies  Social History   Socioeconomic History  . Marital status: Single    Spouse name: Not on file  . Number of children: Not on file  . Years of education: Not on file  . Highest education level: Not on file  Occupational History  . Not on file  Tobacco Use  . Smoking status: Current Every Day Smoker    Packs/day: 2.00    Years: 15.00    Pack years: 30.00     Types: Cigarettes    Last attempt to quit: 06/01/2015    Years since quitting: 3.9  . Smokeless tobacco: Never Used  . Tobacco comment: SOmetimes more.  Substance and Sexual Activity  . Alcohol use: Yes    Alcohol/week: 1.0 standard drinks    Types: 1 Glasses of wine per week    Comment: Has cut down significantly but drinks occasionally wine.   . Drug use: No  . Sexual activity: Yes    Birth control/protection: None  Other Topics Concern  . Not on file  Social History Narrative   Has a 55 year old son (2011)   Just quit smoking      Family history of DM, Cancer (type?)   Social Determinants of Health   Financial Resource Strain:   . Difficulty of Paying Living Expenses: Not on file  Food Insecurity:   . Worried About Charity fundraiser in the Last Year: Not on file  . Ran Out of Food in the Last Year: Not on file  Transportation Needs:   . Lack of Transportation (Medical): Not on file  . Lack of Transportation (Non-Medical): Not on file  Physical Activity:   . Days of Exercise per Week: Not on file  . Minutes of Exercise per Session: Not on file  Stress:   . Feeling of Stress : Not on file  Social Connections:   . Frequency of Communication with Friends and Family: Not on file  . Frequency of Social Gatherings with Friends and Family: Not on file  . Attends Religious Services: Not on file  . Active Member of Clubs or Organizations: Not on file  . Attends Archivist Meetings: Not on file  . Marital Status: Not on file  Intimate Partner Violence:   . Fear of Current or Ex-Partner: Not on file  . Emotionally Abused: Not on file  . Physically Abused: Not on file  . Sexually Abused: Not on file    Family History  Problem Relation Age of Onset  . Scoliosis Mother   . Hypertension Mother   . Arthritis Mother   . Hyperlipidemia Mother     Past Surgical History:  Procedure Laterality Date  . CESAREAN SECTION  2003    ROS: Review of Systems Negative  except as stated above  PHYSICAL EXAM: BP (!) 166/109   Pulse 80   Temp (!) 97.5 F (36.4 C)   Resp 16   Wt 244 lb 8 oz (110.9 kg)   SpO2 100%   BMI 37.18 kg/m   Physical Exam  General appearance - alert, well appearing, young to middle-aged African-American female and in no distress Mental status - alert, oriented to person, place, and time, normal mood, behavior, speech, dress, motor activity, and thought processes Neck - supple, no significant adenopathy Chest - clear to auscultation, no wheezes, rales or rhonchi, symmetric air entry Heart - normal rate, regular rhythm, normal S1, S2, no murmurs, rubs, clicks or gallops Musculoskeletal -Extremities -mild tenderness on palpation of mid through lower thoracic and upper lumbar spine.  No tenderness on surrounding paraspinal muscles.  She appears uncomfortable with straight leg raise on either side.  Power in the lower extremities 5/5 bilaterally. Right shoulder: No point tenderness on palpation of the anterior or posterior joint.  Most of her tenderness appears to be over the trapezius and supraspinatus muscles.  Drop arm test is negative.  Good passive range of motion Skin: 1 cm subcutaneous, soft movable mass located in the right upper lumbar paraspinal muscle.  It is not tender to touch  CMP Latest Ref Rng & Units 03/29/2019 10/21/2017 12/02/2016  Glucose 65 - 99 mg/dL 85 102(H) 80  BUN 6 - 20 mg/dL 19 20 14   Creatinine 0.57 - 1.00 mg/dL 1.18(H) 1.37(H) 1.15(H)  Sodium 134 - 144 mmol/L 139 138 140  Potassium 3.5 - 5.2 mmol/L 4.7 3.8 4.2  Chloride 96 - 106 mmol/L 106 110 103  CO2 20 - 29 mmol/L 20 23 20   Calcium 8.7 - 10.2 mg/dL 8.8 8.6(L) 9.0  Total Protein 6.0 - 8.5 g/dL 7.3 6.9 -  Total Bilirubin 0.0 - 1.2 mg/dL 0.4 0.3 -  Alkaline Phos 39 - 117 IU/L 69 63 -  AST 0 - 40 IU/L 13 18 -  ALT 0 - 32 IU/L 13 17 -   Lipid Panel     Component Value Date/Time   CHOL 124 03/29/2019 1428   TRIG 55 03/29/2019 1428   HDL 58 03/29/2019  1428   CHOLHDL 2.1 03/29/2019 1428   CHOLHDL 4.0 02/14/2011 1038   VLDL 10 02/14/2011 1038   LDLCALC 54 03/29/2019 1428    CBC    Component Value Date/Time   WBC 7.8 03/29/2019 1428   WBC 8.8 10/21/2017 0450   RBC 3.84 03/29/2019 1428   RBC 3.68 (L) 10/21/2017 0450   HGB 8.2 (L) 03/29/2019 1428   HCT 27.5 (L) 03/29/2019 1428   PLT 283 03/29/2019 1428   MCV 72 (L) 03/29/2019 1428   MCH 21.4 (L) 03/29/2019 1428   MCH 25.5 (L) 10/21/2017 0450   MCHC 29.8 (L) 03/29/2019 1428   MCHC 29.7 (L) 10/21/2017 0450   RDW 16.3 (H) 03/29/2019 1428   LYMPHSABS 2.1 04/27/2013 1140   MONOABS 0.4 04/27/2013 1140   EOSABS 0.3 04/27/2013 1140   BASOSABS 0.0 04/27/2013 1140    ASSESSMENT AND PLAN: 1. Essential hypertension Not at goal.  She has been out of the clonidine patch. Patient advised to contact her pharmacy and make sure that they deliver all 3 blood pressure medications. -Advised to check blood pressure at least twice a week and record numbers.  Follow-up with clinical pharmacist in 2 weeks.  Advised to bring her blood pressure monitoring device with her to that visit so that blood pressure can be checked on both her device and ours.  If blood pressure still elevated we can add thiazide diuretic DASH diet discussed and encouraged  2. Tobacco dependence Strongly advised to quit smoking.  Health risks associated with smoking discussed.  Patient has Chantix and will start it when she is ready to give a trial of quitting.  Less than 5 minutes spent on counseling.  3. MENORRHAGIA 4. Fibroids Patient to keep appointment with gynecologist.  5. Iron deficiency anemia, unspecified iron deficiency anemia type Advised that dizziness and fatigue is likely due to the anemia.  Encouraged her to get the iron supplement as prescribed and start taking it  6. Chronic bilateral low back pain without sciatica No alarm features. We will get baseline imaging studies. Consider referral to physical  therapy Prescribed Voltaren gel to use as needed.  Prescribe Robaxin to use as needed.  Advised patient that the muscle relaxant can cause drowsiness - diclofenac Sodium (VOLTAREN) 1 % GEL; Apply 2 g topically 4 (four) times daily.  Dispense: 100 g; Refill: 1 - methocarbamol (ROBAXIN) 500 MG tablet; Take 1 tablet (500 mg total) by mouth daily as needed for muscle spasms.  Dispense: 30 tablet; Refill: 1 - DG Thoracic Spine W/Swimmers; Future  7. Chronic right shoulder pain Start with baseline imaging studies.  Further management will be based on results - diclofenac Sodium (VOLTAREN) 1 % GEL; Apply 2 g topically 4 (four) times daily.  Dispense: 100 g; Refill: 1 - methocarbamol (ROBAXIN) 500 MG tablet; Take 1 tablet (500 mg total) by mouth daily as needed for muscle spasms.  Dispense: 30 tablet; Refill: 1 - DG Shoulder Right; Future  8. Epidermal cyst Observe for now  9. Gastroesophageal reflux disease without esophagitis At the  end of the visit patient reports that she had been having some heartburn.  Advised patient that we will need to discuss on follow-up visit in the interest of time.  However until then I have prescribed some omeprazole for her to use - omeprazole (PRILOSEC) 20 MG capsule; Take 1 capsule (20 mg total) by mouth daily.  Dispense: 30 capsule; Refill: 3    Patient was given the opportunity to ask questions.  Patient verbalized understanding of the plan and was able to repeat key elements of the plan.   Orders Placed This Encounter  Procedures  . DG Shoulder Right  . DG Thoracic Spine W/Swimmers     Requested Prescriptions   Signed Prescriptions Disp Refills  . diclofenac Sodium (VOLTAREN) 1 % GEL 100 g 1    Sig: Apply 2 g topically 4 (four) times daily.  . methocarbamol (ROBAXIN) 500 MG tablet 30 tablet 1    Sig: Take 1 tablet (500 mg total) by mouth daily as needed for muscle spasms.  Marland Kitchen omeprazole (PRILOSEC) 20 MG capsule 30 capsule 3    Sig: Take 1 capsule (20  mg total) by mouth daily.    Return in about 3 months (around 08/24/2019).  Karle Plumber, MD, FACP

## 2019-05-29 DIAGNOSIS — D509 Iron deficiency anemia, unspecified: Secondary | ICD-10-CM | POA: Insufficient documentation

## 2019-05-29 DIAGNOSIS — D219 Benign neoplasm of connective and other soft tissue, unspecified: Secondary | ICD-10-CM | POA: Insufficient documentation

## 2019-05-29 DIAGNOSIS — D259 Leiomyoma of uterus, unspecified: Secondary | ICD-10-CM | POA: Insufficient documentation

## 2019-05-29 DIAGNOSIS — L720 Epidermal cyst: Secondary | ICD-10-CM | POA: Insufficient documentation

## 2019-06-17 ENCOUNTER — Encounter: Payer: Self-pay | Admitting: Obstetrics and Gynecology

## 2019-06-17 ENCOUNTER — Encounter: Payer: Medicaid Other | Admitting: Obstetrics and Gynecology

## 2019-06-18 ENCOUNTER — Other Ambulatory Visit: Payer: Self-pay | Admitting: Internal Medicine

## 2019-06-18 DIAGNOSIS — E785 Hyperlipidemia, unspecified: Secondary | ICD-10-CM

## 2019-06-28 ENCOUNTER — Emergency Department (HOSPITAL_COMMUNITY): Payer: Medicaid Other

## 2019-06-28 ENCOUNTER — Emergency Department (HOSPITAL_COMMUNITY)
Admission: EM | Admit: 2019-06-28 | Discharge: 2019-06-28 | Disposition: A | Discharge status: Home / Self Care | Payer: Medicaid Other | Attending: Emergency Medicine | Admitting: Emergency Medicine

## 2019-06-28 ENCOUNTER — Other Ambulatory Visit: Payer: Self-pay

## 2019-06-28 ENCOUNTER — Encounter (HOSPITAL_COMMUNITY): Payer: Self-pay | Admitting: *Deleted

## 2019-06-28 DIAGNOSIS — S6992XA Unspecified injury of left wrist, hand and finger(s), initial encounter: Secondary | ICD-10-CM | POA: Diagnosis not present

## 2019-06-28 DIAGNOSIS — G43909 Migraine, unspecified, not intractable, without status migrainosus: Secondary | ICD-10-CM | POA: Insufficient documentation

## 2019-06-28 DIAGNOSIS — M25561 Pain in right knee: Secondary | ICD-10-CM | POA: Insufficient documentation

## 2019-06-28 DIAGNOSIS — M25522 Pain in left elbow: Secondary | ICD-10-CM | POA: Diagnosis not present

## 2019-06-28 DIAGNOSIS — S59912A Unspecified injury of left forearm, initial encounter: Secondary | ICD-10-CM | POA: Diagnosis not present

## 2019-06-28 DIAGNOSIS — M79622 Pain in left upper arm: Secondary | ICD-10-CM | POA: Insufficient documentation

## 2019-06-28 DIAGNOSIS — S0990XA Unspecified injury of head, initial encounter: Secondary | ICD-10-CM | POA: Diagnosis not present

## 2019-06-28 DIAGNOSIS — S299XXA Unspecified injury of thorax, initial encounter: Secondary | ICD-10-CM | POA: Diagnosis not present

## 2019-06-28 DIAGNOSIS — M79642 Pain in left hand: Secondary | ICD-10-CM | POA: Insufficient documentation

## 2019-06-28 DIAGNOSIS — S4992XA Unspecified injury of left shoulder and upper arm, initial encounter: Secondary | ICD-10-CM | POA: Diagnosis not present

## 2019-06-28 DIAGNOSIS — S59902A Unspecified injury of left elbow, initial encounter: Secondary | ICD-10-CM | POA: Diagnosis not present

## 2019-06-28 DIAGNOSIS — M25512 Pain in left shoulder: Secondary | ICD-10-CM | POA: Diagnosis not present

## 2019-06-28 DIAGNOSIS — M25532 Pain in left wrist: Secondary | ICD-10-CM | POA: Diagnosis not present

## 2019-06-28 DIAGNOSIS — M79632 Pain in left forearm: Secondary | ICD-10-CM | POA: Diagnosis not present

## 2019-06-28 DIAGNOSIS — R0789 Other chest pain: Secondary | ICD-10-CM | POA: Diagnosis not present

## 2019-06-28 DIAGNOSIS — R1084 Generalized abdominal pain: Secondary | ICD-10-CM | POA: Insufficient documentation

## 2019-06-28 DIAGNOSIS — I10 Essential (primary) hypertension: Secondary | ICD-10-CM | POA: Diagnosis not present

## 2019-06-28 DIAGNOSIS — M79602 Pain in left arm: Secondary | ICD-10-CM | POA: Diagnosis not present

## 2019-06-28 DIAGNOSIS — Y999 Unspecified external cause status: Secondary | ICD-10-CM | POA: Insufficient documentation

## 2019-06-28 DIAGNOSIS — F17210 Nicotine dependence, cigarettes, uncomplicated: Secondary | ICD-10-CM | POA: Insufficient documentation

## 2019-06-28 DIAGNOSIS — R109 Unspecified abdominal pain: Secondary | ICD-10-CM | POA: Diagnosis not present

## 2019-06-28 DIAGNOSIS — M549 Dorsalgia, unspecified: Secondary | ICD-10-CM | POA: Insufficient documentation

## 2019-06-28 DIAGNOSIS — Y92410 Unspecified street and highway as the place of occurrence of the external cause: Secondary | ICD-10-CM | POA: Diagnosis not present

## 2019-06-28 DIAGNOSIS — M542 Cervicalgia: Secondary | ICD-10-CM | POA: Insufficient documentation

## 2019-06-28 DIAGNOSIS — Y9389 Activity, other specified: Secondary | ICD-10-CM | POA: Diagnosis not present

## 2019-06-28 DIAGNOSIS — S8991XA Unspecified injury of right lower leg, initial encounter: Secondary | ICD-10-CM | POA: Diagnosis not present

## 2019-06-28 DIAGNOSIS — S3991XA Unspecified injury of abdomen, initial encounter: Secondary | ICD-10-CM | POA: Diagnosis not present

## 2019-06-28 DIAGNOSIS — R079 Chest pain, unspecified: Secondary | ICD-10-CM | POA: Diagnosis not present

## 2019-06-28 DIAGNOSIS — R519 Headache, unspecified: Secondary | ICD-10-CM | POA: Diagnosis not present

## 2019-06-28 DIAGNOSIS — S199XXA Unspecified injury of neck, initial encounter: Secondary | ICD-10-CM | POA: Diagnosis not present

## 2019-06-28 HISTORY — DX: Benign neoplasm of connective and other soft tissue, unspecified: D21.9

## 2019-06-28 LAB — COMPREHENSIVE METABOLIC PANEL
ALT: 18 U/L (ref 0–44)
AST: 19 U/L (ref 15–41)
Albumin: 3.6 g/dL (ref 3.5–5.0)
Alkaline Phosphatase: 56 U/L (ref 38–126)
Anion gap: 8 (ref 5–15)
BUN: 24 mg/dL — ABNORMAL HIGH (ref 6–20)
CO2: 25 mmol/L (ref 22–32)
Calcium: 8.9 mg/dL (ref 8.9–10.3)
Chloride: 106 mmol/L (ref 98–111)
Creatinine, Ser: 1.4 mg/dL — ABNORMAL HIGH (ref 0.44–1.00)
GFR calc Af Amer: 55 mL/min — ABNORMAL LOW (ref >60)
GFR calc non Af Amer: 47 mL/min — ABNORMAL LOW (ref >60)
Glucose, Bld: 96 mg/dL (ref 70–99)
Potassium: 4 mmol/L (ref 3.5–5.1)
Sodium: 139 mmol/L (ref 135–145)
Total Bilirubin: 0.5 mg/dL (ref 0.3–1.2)
Total Protein: 6.6 g/dL (ref 6.5–8.1)

## 2019-06-28 LAB — CBC
HCT: 32.7 % — ABNORMAL LOW (ref 36.0–46.0)
Hemoglobin: 9.7 g/dL — ABNORMAL LOW (ref 12.0–15.0)
MCH: 24.9 pg — ABNORMAL LOW (ref 26.0–34.0)
MCHC: 29.7 g/dL — ABNORMAL LOW (ref 30.0–36.0)
MCV: 83.8 fL (ref 80.0–100.0)
Platelets: 224 10*3/uL (ref 150–400)
RBC: 3.9 MIL/uL (ref 3.87–5.11)
RDW: 24.9 % — ABNORMAL HIGH (ref 11.5–15.5)
WBC: 6.8 10*3/uL (ref 4.0–10.5)
nRBC: 0 % (ref 0.0–0.2)

## 2019-06-28 LAB — I-STAT BETA HCG BLOOD, ED (MC, WL, AP ONLY): I-stat hCG, quantitative: <5 m[IU]/mL (ref <5)

## 2019-06-28 MED ORDER — SODIUM CHLORIDE (PF) 0.9 % IJ SOLN
INTRAMUSCULAR | Status: AC
  Filled 2019-06-28: qty 50

## 2019-06-28 MED ORDER — CYCLOBENZAPRINE HCL 10 MG PO TABS: 10.0000 mg | ORAL_TABLET | Freq: Two times a day (BID) | ORAL | 0 refills | Status: DC | PRN

## 2019-06-28 MED ORDER — HYDROMORPHONE HCL 1 MG/ML IJ SOLN
1.0000 mg | Freq: Once | INTRAMUSCULAR | Status: AC
  Administered 2019-06-28: 1 mg via INTRAVENOUS
  Filled 2019-06-28: qty 1

## 2019-06-28 MED ORDER — OXYCODONE HCL 5 MG PO TABS
5.0000 mg | ORAL_TABLET | Freq: Once | ORAL | Status: AC
  Administered 2019-06-28: 5 mg via ORAL
  Filled 2019-06-28: qty 1

## 2019-06-28 MED ORDER — ONDANSETRON 4 MG PO TBDP
4.0000 mg | ORAL_TABLET | Freq: Once | ORAL | Status: AC
  Administered 2019-06-28: 22:00:00 4 mg via ORAL
  Filled 2019-06-28: qty 1

## 2019-06-28 MED ORDER — IOHEXOL 300 MG/ML  SOLN
100.0000 mL | Freq: Once | INTRAMUSCULAR | Status: AC | PRN
  Administered 2019-06-28: 100 mL via INTRAVENOUS
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
## 2019-06-28 NOTE — ED Notes (Signed)
Pt transported to Xray at this time.
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
## 2019-06-28 NOTE — ED Notes (Signed)
Pt moved to room 13, talking on cell phone during process.
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
## 2019-07-01 ENCOUNTER — Ambulatory Visit: Payer: Medicaid Other | Attending: Internal Medicine | Admitting: Internal Medicine

## 2019-07-01 ENCOUNTER — Other Ambulatory Visit: Payer: Self-pay

## 2019-07-01 ENCOUNTER — Other Ambulatory Visit: Payer: Self-pay | Admitting: Internal Medicine

## 2019-07-01 DIAGNOSIS — M25512 Pain in left shoulder: Secondary | ICD-10-CM

## 2019-07-01 DIAGNOSIS — M25511 Pain in right shoulder: Secondary | ICD-10-CM

## 2019-07-01 DIAGNOSIS — R519 Headache, unspecified: Secondary | ICD-10-CM | POA: Diagnosis not present

## 2019-07-01 DIAGNOSIS — M791 Myalgia, unspecified site: Secondary | ICD-10-CM

## 2019-07-01 DIAGNOSIS — M542 Cervicalgia: Secondary | ICD-10-CM

## 2019-07-01 DIAGNOSIS — M549 Dorsalgia, unspecified: Secondary | ICD-10-CM | POA: Diagnosis not present

## 2019-07-01 DIAGNOSIS — G8929 Other chronic pain: Secondary | ICD-10-CM

## 2019-07-01 MED ORDER — ACETAMINOPHEN-CODEINE 300-30 MG PO TABS: 2.0000 | ORAL_TABLET | Freq: Three times a day (TID) | ORAL | 0 refills | Status: DC | PRN
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
## 2019-07-04 ENCOUNTER — Telehealth: Payer: Self-pay | Admitting: Internal Medicine

## 2019-07-04 ENCOUNTER — Other Ambulatory Visit: Payer: Self-pay

## 2019-07-04 ENCOUNTER — Ambulatory Visit (HOSPITAL_COMMUNITY)
Admission: RE | Admit: 2019-07-04 | Discharge: 2019-07-04 | Disposition: A | Discharge status: Home / Self Care | Payer: Medicaid Other | Source: Ambulatory Visit | Attending: Internal Medicine | Admitting: Internal Medicine

## 2019-07-04 DIAGNOSIS — M25512 Pain in left shoulder: Secondary | ICD-10-CM | POA: Diagnosis not present

## 2019-07-04 DIAGNOSIS — G8929 Other chronic pain: Secondary | ICD-10-CM | POA: Insufficient documentation

## 2019-07-04 DIAGNOSIS — M549 Dorsalgia, unspecified: Secondary | ICD-10-CM | POA: Diagnosis present

## 2019-07-04 DIAGNOSIS — S299XXA Unspecified injury of thorax, initial encounter: Secondary | ICD-10-CM | POA: Diagnosis not present

## 2019-07-04 DIAGNOSIS — M47816 Spondylosis without myelopathy or radiculopathy, lumbar region: Secondary | ICD-10-CM | POA: Diagnosis not present

## 2019-07-04 DIAGNOSIS — M25511 Pain in right shoulder: Secondary | ICD-10-CM | POA: Insufficient documentation

## 2019-07-04 NOTE — Telephone Encounter (Signed)
Please fill if appropriate to continue the usage

## 2019-07-04 NOTE — Telephone Encounter (Signed)
Attempted to do attestation for rider waiver but got patient's voicemail. Gave her our contact information to call back.

## 2019-07-04 NOTE — Telephone Encounter (Signed)
   Jeanette Gamble DOB: 21-Sep-1980 MRN: [id number]   RIDER WAIVER AND RELEASE OF LIABILITY  For purposes of improving physical access to our facilities, [facility] is pleased to partner with third parties to provide [facility] patients or other authorized individuals the option of convenient, on-demand ground transportation services (the [employer]") through use of the technology service that enables users to request on-demand ground transportation from independent third-party providers.  By opting to use and accept these [employer], I, the undersigned, hereby agree on behalf of myself, and on behalf of any minor child using the [employer] for whom I am the parent or legal guardian, as follows:  1. Government social research officer provided to me are provided by independent third-party transportation providers who are not [employer] or employees and who are unaffiliated with [employer]. 2. [facility] is neither a transportation carrier nor a common or public carrier. 3. [facility] has no control over the quality or safety of the transportation that occurs as a result of the [employer]. 4. [facility] cannot guarantee that any third-party transportation provider will complete any arranged transportation service. 5. [facility] makes no representation, warranty, or guarantee regarding the reliability, timeliness, quality, safety, suitability, or availability of any of the Transport Services or that they will be error free. 6. I fully understand that traveling by vehicle involves risks and dangers of serious bodily injury, including permanent disability, paralysis, and death. I agree, on behalf of myself and on behalf of any minor child using the Transport Services for whom I am the parent or legal guardian, that the entire risk arising out of my use of the [employer] remains solely with me, to the maximum extent permitted under applicable law. 7. The [employer] are provided "as is" and "as available." [facility] disclaims all representations and warranties, express, implied or statutory, not expressly set out in these terms, including the implied warranties of merchantability and fitness for a particular purpose. 8. I hereby waive and release [facility], its agents, employees, officers, directors, representatives, insurers, attorneys, assigns, successors, subsidiaries, and affiliates from any and all past, present, or future claims, demands, liabilities, actions, causes of action, or suits of any kind directly or indirectly arising from acceptance and use of the [employer]. 9. I further waive and release [facility] and its affiliates from all present and future liability and responsibility for any injury or death to persons or damages to property caused by or related to the use of the [employer]. 10. I have read this Waiver and Release of Liability, and I understand the terms used in it and their legal significance. This Waiver is freely and voluntarily given with the understanding that my right (as well as the right of any minor child for whom I am the parent or legal guardian using the [employer]) to legal recourse against [place] in connection with the [employer] is knowingly surrendered in return for use of these services.   I attest that I read the consent document to [name], gave Ms. Driver the opportunity to ask questions and answered the questions asked (if any). I affirm that [name] then provided consent for she's participation in this program.     Jeanette Gamble

## 2019-07-05 ENCOUNTER — Telehealth: Payer: Self-pay

## 2019-07-05 NOTE — Telephone Encounter (Signed)
Contacted pt to go over xray results pt didn't answer lvm asking pt to give a call back at her earliest convenience

## 2019-07-06 ENCOUNTER — Other Ambulatory Visit: Payer: Self-pay

## 2019-07-06 ENCOUNTER — Ambulatory Visit: Payer: Medicaid Other | Admitting: Internal Medicine

## 2019-07-06 ENCOUNTER — Encounter: Payer: Self-pay | Admitting: Internal Medicine

## 2019-07-06 VITALS — BP 153/102 | HR 67 | Temp 98.2°F | Wt 252.7 lb

## 2019-07-06 DIAGNOSIS — M1A0720 Idiopathic chronic gout, left ankle and foot, without tophus (tophi): Secondary | ICD-10-CM

## 2019-07-06 DIAGNOSIS — M5011 Cervical disc disorder with radiculopathy,  high cervical region: Secondary | ICD-10-CM | POA: Diagnosis not present

## 2019-07-06 DIAGNOSIS — M4802 Spinal stenosis, cervical region: Secondary | ICD-10-CM | POA: Diagnosis not present

## 2019-07-06 DIAGNOSIS — R202 Paresthesia of skin: Secondary | ICD-10-CM

## 2019-07-06 DIAGNOSIS — K219 Gastro-esophageal reflux disease without esophagitis: Secondary | ICD-10-CM | POA: Diagnosis not present

## 2019-07-06 DIAGNOSIS — G8911 Acute pain due to trauma: Secondary | ICD-10-CM

## 2019-07-06 DIAGNOSIS — Z79899 Other long term (current) drug therapy: Secondary | ICD-10-CM

## 2019-07-06 DIAGNOSIS — G8929 Other chronic pain: Secondary | ICD-10-CM

## 2019-07-06 DIAGNOSIS — R29898 Other symptoms and signs involving the musculoskeletal system: Secondary | ICD-10-CM

## 2019-07-06 DIAGNOSIS — M542 Cervicalgia: Secondary | ICD-10-CM

## 2019-07-06 DIAGNOSIS — M5412 Radiculopathy, cervical region: Secondary | ICD-10-CM | POA: Diagnosis not present

## 2019-07-06 DIAGNOSIS — M25511 Pain in right shoulder: Secondary | ICD-10-CM

## 2019-07-06 DIAGNOSIS — M67912 Unspecified disorder of synovium and tendon, left shoulder: Secondary | ICD-10-CM

## 2019-07-06 DIAGNOSIS — R200 Anesthesia of skin: Secondary | ICD-10-CM

## 2019-07-06 DIAGNOSIS — M545 Low back pain, unspecified: Secondary | ICD-10-CM

## 2019-07-06 DIAGNOSIS — R29818 Other symptoms and signs involving the nervous system: Secondary | ICD-10-CM

## 2019-07-06 MED ORDER — OMEPRAZOLE 20 MG PO CPDR: 20.0000 mg | DELAYED_RELEASE_CAPSULE | Freq: Every day | ORAL | 3 refills | Status: DC

## 2019-07-06 MED ORDER — METHOCARBAMOL 500 MG PO TABS: 500.0000 mg | ORAL_TABLET | Freq: Every day | ORAL | 1 refills | Status: DC | PRN

## 2019-07-06 MED ORDER — COLCHICINE 0.6 MG PO TABS: 0.6000 mg | ORAL_TABLET | Freq: Every day | ORAL | 0 refills | Status: DC

## 2019-07-06 MED FILL — OMEPRAZOLE DR 20 MG CAPSULE: 20 | 30 days supply | Qty: 30 | Fill #0
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
## 2019-07-06 NOTE — Patient Instructions (Addendum)
You were seen for evaluation of your shoulder and neck pain. Here are my recommendations:  1) We have ordered an MRI of your neck and left shoulder.  This will help in telling us if there is injury to your nerves or tendons.  Please pick up your Tylenol 3 sent from your prior PCP as well as the Robaxin to help with your pain.  2) I have sent a prescription for colchicine and we will check your uric acid level today. If it is elevated, we can start allopurinol to lower this level.  3) I have sent a refill for omeprazole to the pharmacy  Thank you for allowing [place] to be part of your medical care!  Please call with any questions or concerns
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
## 2019-07-06 NOTE — Assessment & Plan Note (Signed)
On review of prior encounters, patient has been treated with colchicine for gout prophylaxis.  Patient with prior elevated uric acid level to 9.2 during acute flare.  Patient has no history of joint aspiration.  Plan: *Prescription sent for colchicine *We will recheck uric acid level and if it remains elevated, will start patient on allopurinol *Return in 4 weeks for recheck of uric acid, if it is at goal, colchicine will be discontinued

## 2019-07-06 NOTE — Assessment & Plan Note (Signed)
Refill sent for omeprazole.  

## 2019-07-07 ENCOUNTER — Telehealth: Payer: Self-pay | Admitting: Internal Medicine

## 2019-07-07 ENCOUNTER — Telehealth: Payer: Self-pay | Admitting: *Deleted

## 2019-07-07 LAB — URIC ACID: Uric Acid: 7.1 mg/dL — ABNORMAL HIGH (ref 2.6–6.2)

## 2019-07-07 NOTE — Telephone Encounter (Signed)
Attempts to do PA for Colchicine. [place].  Allopurinol, Mitigare Capsules,  Probenecid tablets, Probenecid-colchicine tablets.  Message to Dr. Benjamine Mola to consider a change.  Sander Nephew, RN 07/07/2919 2:16 PM.

## 2019-07-07 NOTE — Telephone Encounter (Signed)
Spoke with patient on phone. Patient states employer asked her to bring a doctors note when she returns to work. I can write patient note for up to 7 days but after that patient will require FMLA paperwork. Given work is not requiring note at this time (only upon return, which is likely not for a while), I suggested patient contact HR and get started on FMLA paperwork. Patient in agreement with plan

## 2019-07-07 NOTE — Telephone Encounter (Signed)
Pt requesting phone call back in reference to a Work Excuse.  Pt seen yesterday in the [facility] .  Please call the patient back.

## 2019-07-08 ENCOUNTER — Other Ambulatory Visit: Payer: Self-pay | Admitting: Internal Medicine

## 2019-07-08 MED ORDER — ALLOPURINOL 100 MG PO TABS: 100.0000 mg | ORAL_TABLET | Freq: Every day | ORAL | 2 refills | Status: DC

## 2019-07-08 MED FILL — ALLOPURINOL 100 MG TABS: 100 | 30 days supply | Qty: 30 | Fill #0

## 2019-07-08 NOTE — Progress Notes (Signed)
Patient with history of gout and elevated uric acid (see prior encounter). Prescription sent for allopurinol. Plan discussed with patient.  Jeanmarie Hubert, MD 07/08/2019, 12:42 PM

## 2019-07-18 NOTE — Progress Notes (Signed)
Internal Medicine Clinic Attending  Case discussed with Dr. MacLean at the time of the visit.  We reviewed the resident's history and exam and pertinent patient test results.  I agree with the assessment, diagnosis, and plan of care documented in the resident's note.    

## 2019-07-19 ENCOUNTER — Other Ambulatory Visit: Payer: Self-pay | Admitting: Internal Medicine

## 2019-07-19 ENCOUNTER — Other Ambulatory Visit: Payer: Self-pay

## 2019-07-19 DIAGNOSIS — M25512 Pain in left shoulder: Secondary | ICD-10-CM

## 2019-07-19 DIAGNOSIS — G8929 Other chronic pain: Secondary | ICD-10-CM

## 2019-07-19 DIAGNOSIS — M542 Cervicalgia: Secondary | ICD-10-CM

## 2019-07-19 DIAGNOSIS — M545 Low back pain, unspecified: Secondary | ICD-10-CM

## 2019-07-19 MED FILL — ALLOPURINOL 100 MG TABS: 100 | 30 days supply | Qty: 30 | Fill #0

## 2019-07-19 MED FILL — OMEPRAZOLE DR 20 MG CAPSULE: 20 | 30 days supply | Qty: 30 | Fill #0

## 2019-07-19 NOTE — Telephone Encounter (Signed)
Rx was last sent on 07/01/19 by [facility], Dr. Wynetta Emery (#30) Pt's LOV 07/06/19 in [facility] with Dr. Aundra Dubin Pt's Ages is tomorrow in [facility]. Will forward to Dr. Aundra Dubin and PCP. SChaplin, RN,BSN

## 2019-07-19 NOTE — Telephone Encounter (Signed)
Acetaminophen-Codeine (TYLENOL/CODEINE #3) 300-30 MG tablet   Refill request @  [place], [place] - [address]. [phone] (Phone) [phone] (Fax)

## 2019-07-20 ENCOUNTER — Ambulatory Visit (INDEPENDENT_AMBULATORY_CARE_PROVIDER_SITE_OTHER): Payer: Medicaid Other | Admitting: Internal Medicine

## 2019-07-20 ENCOUNTER — Encounter: Payer: Self-pay | Admitting: Internal Medicine

## 2019-07-20 DIAGNOSIS — M25512 Pain in left shoulder: Secondary | ICD-10-CM

## 2019-07-20 DIAGNOSIS — I10 Essential (primary) hypertension: Secondary | ICD-10-CM

## 2019-07-20 DIAGNOSIS — M542 Cervicalgia: Secondary | ICD-10-CM | POA: Diagnosis not present

## 2019-07-20 DIAGNOSIS — G8911 Acute pain due to trauma: Secondary | ICD-10-CM

## 2019-07-20 DIAGNOSIS — Z79899 Other long term (current) drug therapy: Secondary | ICD-10-CM

## 2019-07-20 DIAGNOSIS — M549 Dorsalgia, unspecified: Secondary | ICD-10-CM

## 2019-07-20 MED ORDER — LISINOPRIL 20 MG PO TABS: 20.0000 mg | ORAL_TABLET | Freq: Every day | ORAL | 3 refills | Status: DC

## 2019-07-20 MED ORDER — ACETAMINOPHEN-CODEINE #3 300-30 MG PO TABS: 1.0000 | ORAL_TABLET | ORAL | 0 refills | Status: DC | PRN

## 2019-07-20 MED ORDER — CARVEDILOL 6.25 MG PO TABS: 6.2500 mg | ORAL_TABLET | Freq: Two times a day (BID) | ORAL | 2 refills | Status: DC

## 2019-07-20 NOTE — Patient Instructions (Addendum)
You were seen for followup on your back pain. Here are my recommendations  1) We have sent a prescription for tylenol #3 to your pharmacy  2) Please continue to work with physical therapy as it can help improve your pain  3) For your blood pressure, we have increased your lisinopril to 20 mg daily and have started carvedilol 6.25 mg twice daily  Thank you for allowing [place] to be part of your medical care!
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
## 2019-07-22 NOTE — Assessment & Plan Note (Addendum)
Patient on regimen of amlodipine 10 mg daily.  Patient has continued elevation in blood pressure to 192/140.  BP is elevated to 190/132 at last encounter.  Pain is likely contributory to hypertension.  On review encounter from June 2019, patient had similar elevation in blood pressure and was continued on amlodipine, lisinopril, and clonidine.  Patient states that she ran out of clonidine a while ago, is not taking. Renal function and electrolytes were stable on BMP from 06/28/19.  A/P: Given persistently elevated blood pressure, will increase lisinopril dosage and add carvedilol. Not candidate for HCTZ at this time due to gout history *Continue amlodipine 10 mg daily *Increase lisinopril to 20 mg once daily *Start carvedilol 6.25 mg twice daily

## 2019-07-24 ENCOUNTER — Ambulatory Visit (HOSPITAL_COMMUNITY)
Admission: RE | Admit: 2019-07-24 | Discharge: 2019-07-24 | Disposition: A | Discharge status: Home / Self Care | Payer: Medicaid Other | Source: Ambulatory Visit | Attending: Internal Medicine | Admitting: Internal Medicine

## 2019-07-24 ENCOUNTER — Other Ambulatory Visit: Payer: Self-pay

## 2019-07-24 DIAGNOSIS — R29898 Other symptoms and signs involving the musculoskeletal system: Secondary | ICD-10-CM | POA: Diagnosis present

## 2019-07-24 DIAGNOSIS — M25512 Pain in left shoulder: Secondary | ICD-10-CM | POA: Diagnosis not present

## 2019-07-24 DIAGNOSIS — M67912 Unspecified disorder of synovium and tendon, left shoulder: Secondary | ICD-10-CM

## 2019-07-24 DIAGNOSIS — M5412 Radiculopathy, cervical region: Secondary | ICD-10-CM | POA: Diagnosis present

## 2019-07-24 DIAGNOSIS — R202 Paresthesia of skin: Secondary | ICD-10-CM | POA: Diagnosis present

## 2019-07-24 DIAGNOSIS — R29818 Other symptoms and signs involving the nervous system: Secondary | ICD-10-CM | POA: Insufficient documentation

## 2019-07-24 DIAGNOSIS — R200 Anesthesia of skin: Secondary | ICD-10-CM | POA: Insufficient documentation

## 2019-07-24 DIAGNOSIS — M542 Cervicalgia: Secondary | ICD-10-CM | POA: Diagnosis not present

## 2019-07-25 NOTE — Progress Notes (Signed)
Internal Medicine Clinic Attending  Case discussed with Dr. MacLean at the time of the visit.  We reviewed the resident's history and exam and pertinent patient test results.  I agree with the assessment, diagnosis, and plan of care documented in the resident's note.    

## 2019-07-27 ENCOUNTER — Ambulatory Visit (INDEPENDENT_AMBULATORY_CARE_PROVIDER_SITE_OTHER): Payer: Medicaid Other | Admitting: Internal Medicine

## 2019-07-27 VITALS — BP 175/142 | HR 74 | Ht 68.0 in | Wt 246.0 lb

## 2019-07-27 DIAGNOSIS — I10 Essential (primary) hypertension: Secondary | ICD-10-CM | POA: Diagnosis not present

## 2019-07-27 DIAGNOSIS — F17210 Nicotine dependence, cigarettes, uncomplicated: Secondary | ICD-10-CM | POA: Diagnosis not present

## 2019-07-27 DIAGNOSIS — M549 Dorsalgia, unspecified: Secondary | ICD-10-CM

## 2019-07-27 DIAGNOSIS — G8929 Other chronic pain: Secondary | ICD-10-CM

## 2019-07-27 DIAGNOSIS — G8911 Acute pain due to trauma: Secondary | ICD-10-CM | POA: Diagnosis not present

## 2019-07-27 DIAGNOSIS — M79602 Pain in left arm: Secondary | ICD-10-CM | POA: Diagnosis present

## 2019-07-27 DIAGNOSIS — Z79899 Other long term (current) drug therapy: Secondary | ICD-10-CM

## 2019-07-27 MED ORDER — CARVEDILOL 12.5 MG PO TABS: 12.5000 mg | ORAL_TABLET | Freq: Two times a day (BID) | ORAL | 0 refills | Status: DC

## 2019-07-27 MED ORDER — TIZANIDINE HCL 4 MG PO TABS: 4.0000 mg | ORAL_TABLET | Freq: Three times a day (TID) | ORAL | 1 refills | Status: DC | PRN

## 2019-07-27 MED ORDER — DICLOFENAC SODIUM 1 % EX GEL: 2.0000 g | Freq: Four times a day (QID) | CUTANEOUS | 1 refills | Status: DC

## 2019-07-27 MED FILL — tiZANidine HCL 4 MG TABS: 4 | 30 days supply | Qty: 90 | Fill #0

## 2019-07-27 MED FILL — DICLOFENAC SODIUM 1 % GEL: 1 | 13 days supply | Qty: 100 | Fill #0

## 2019-07-27 NOTE — Patient Instructions (Signed)
Ms. Jasko, I have written you a prescription for a different muscle relaxer called tizanadine.  I have also referred you for physical therapy.  I expect your improvement will take time.  We will continue to work on getting you feeling better.  Let us know if you have any improvement in the next few weeks.  Please discontinue the arm sling.
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
## 2019-07-28 ENCOUNTER — Encounter: Payer: Self-pay | Admitting: Internal Medicine

## 2019-07-28 ENCOUNTER — Ambulatory Visit: Payer: Medicaid Other | Attending: Internal Medicine | Admitting: Physical Therapy

## 2019-07-28 DIAGNOSIS — M79602 Pain in left arm: Secondary | ICD-10-CM | POA: Insufficient documentation

## 2019-07-28 NOTE — Assessment & Plan Note (Signed)
Still hypertensive, systolic somewhat improved.  She reports she is taking her medications lisinopril 20, amlodipine 10 and carvedilol 6.25, she did not bring them today.  Last dispense date in our system is February, pt adamant she is taking them all.    -increase carvedilol to 12.5 -continue to assess compliance
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
## 2019-07-29 NOTE — Progress Notes (Signed)
Internal Medicine Clinic Attending  Case discussed with Dr. Winfrey  at the time of the visit.  We reviewed the resident's history and exam and pertinent patient test results.  I agree with the assessment, diagnosis, and plan of care documented in the resident's note.  

## 2019-08-11 ENCOUNTER — Ambulatory Visit: Payer: Medicaid Other

## 2019-08-11 NOTE — Addendum Note (Signed)
Addended by: Hulan Fray on: 08/11/2019 05:27 PM   Modules accepted: Orders

## 2019-08-19 ENCOUNTER — Other Ambulatory Visit: Payer: Self-pay | Admitting: Internal Medicine

## 2019-08-22 ENCOUNTER — Encounter: Payer: Medicaid Other | Admitting: Internal Medicine

## 2019-08-23 ENCOUNTER — Ambulatory Visit: Payer: Medicaid Other | Attending: Internal Medicine

## 2019-08-23 ENCOUNTER — Other Ambulatory Visit: Payer: Self-pay

## 2019-08-23 DIAGNOSIS — M6281 Muscle weakness (generalized): Secondary | ICD-10-CM | POA: Insufficient documentation

## 2019-08-23 DIAGNOSIS — M25512 Pain in left shoulder: Secondary | ICD-10-CM | POA: Diagnosis not present

## 2019-08-23 DIAGNOSIS — M25612 Stiffness of left shoulder, not elsewhere classified: Secondary | ICD-10-CM | POA: Diagnosis present

## 2019-08-24 ENCOUNTER — Encounter: Payer: Self-pay | Admitting: Internal Medicine

## 2019-08-24 ENCOUNTER — Encounter: Payer: Medicaid Other | Admitting: Internal Medicine

## 2019-08-24 ENCOUNTER — Other Ambulatory Visit: Payer: Self-pay

## 2019-08-24 NOTE — Progress Notes (Signed)
Entered in error
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
## 2019-08-26 ENCOUNTER — Ambulatory Visit: Payer: Medicaid Other

## 2019-08-26 ENCOUNTER — Encounter: Payer: Self-pay | Admitting: *Deleted

## 2019-08-26 NOTE — Telephone Encounter (Signed)
Call to patient about appointment this morning.  Patient rescheduled appointment for Monday.  Sander Nephew, RN 08/26/2019 11:12 AM.

## 2019-08-29 ENCOUNTER — Ambulatory Visit: Payer: Medicaid Other

## 2019-09-05 ENCOUNTER — Ambulatory Visit: Payer: Medicaid Other | Admitting: Internal Medicine

## 2019-09-05 NOTE — Telephone Encounter (Signed)
This encounter was created in error - please disregard.

## 2019-09-06 ENCOUNTER — Ambulatory Visit: Payer: Medicaid Other

## 2019-09-06 ENCOUNTER — Ambulatory Visit (INDEPENDENT_AMBULATORY_CARE_PROVIDER_SITE_OTHER): Payer: Medicaid Other | Admitting: Internal Medicine

## 2019-09-06 ENCOUNTER — Other Ambulatory Visit: Payer: Self-pay

## 2019-09-06 DIAGNOSIS — M545 Low back pain, unspecified: Secondary | ICD-10-CM

## 2019-09-06 DIAGNOSIS — M25519 Pain in unspecified shoulder: Secondary | ICD-10-CM | POA: Diagnosis not present

## 2019-09-06 DIAGNOSIS — I10 Essential (primary) hypertension: Secondary | ICD-10-CM

## 2019-09-06 DIAGNOSIS — M542 Cervicalgia: Secondary | ICD-10-CM | POA: Diagnosis not present

## 2019-09-06 DIAGNOSIS — M25512 Pain in left shoulder: Secondary | ICD-10-CM

## 2019-09-06 DIAGNOSIS — G8929 Other chronic pain: Secondary | ICD-10-CM | POA: Diagnosis not present

## 2019-09-06 DIAGNOSIS — M549 Dorsalgia, unspecified: Secondary | ICD-10-CM | POA: Diagnosis not present

## 2019-09-06 DIAGNOSIS — M6281 Muscle weakness (generalized): Secondary | ICD-10-CM

## 2019-09-06 DIAGNOSIS — M25612 Stiffness of left shoulder, not elsewhere classified: Secondary | ICD-10-CM

## 2019-09-06 MED ORDER — ALLOPURINOL 100 MG PO TABS: 100.0000 mg | ORAL_TABLET | Freq: Every day | ORAL | 2 refills | Status: DC

## 2019-09-06 MED ORDER — ACETAMINOPHEN-CODEINE 300-30 MG PO TABS: 1.0000 | ORAL_TABLET | Freq: Three times a day (TID) | ORAL | 0 refills | Status: AC | PRN
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
## 2019-09-08 ENCOUNTER — Telehealth: Payer: Self-pay

## 2019-09-08 ENCOUNTER — Ambulatory Visit: Payer: Medicaid Other

## 2019-09-08 NOTE — Assessment & Plan Note (Signed)
Patient BP 164/111 today. Suspect this is in setting of acute ongoing pain. Patient is on amlodipine 10mg  daily, lisinopril 20mg  daily and carvedilol 12.5mg  twice daily (not taking). Patient notes that she is taking her amlodipine and lisinopril. However, per chart review, questions regarding medication compliance.   Plan: Continue to monitor and encourage medication compliance Continue amlodipine 10mg  daily Continue lisinopril 20mg  daily Patient to bring medications to next visit

## 2019-09-08 NOTE — Telephone Encounter (Signed)
Attempted to contact pt re: a no show for an appointment on 09/08/19. A message was not able to be left.
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
## 2019-09-09 NOTE — Progress Notes (Signed)
Internal Medicine Clinic Attending  Case discussed with Dr. Aslam at the time of the visit.  We reviewed the resident's history and exam and pertinent patient test results.  I agree with the assessment, diagnosis, and plan of care documented in the resident's note.  

## 2019-09-12 NOTE — Progress Notes (Signed)
Internal Medicine Clinic Attending  Case discussed with Dr. Aslam at the time of the visit.  We reviewed the resident's history and exam and pertinent patient test results.  I agree with the assessment, diagnosis, and plan of care documented in the resident's note.  

## 2019-09-13 ENCOUNTER — Ambulatory Visit: Payer: Medicaid Other

## 2019-09-15 ENCOUNTER — Other Ambulatory Visit: Payer: Self-pay

## 2019-09-15 ENCOUNTER — Other Ambulatory Visit: Payer: Self-pay | Admitting: Internal Medicine

## 2019-09-15 ENCOUNTER — Ambulatory Visit: Payer: Medicaid Other

## 2019-09-15 DIAGNOSIS — G8929 Other chronic pain: Secondary | ICD-10-CM

## 2019-09-15 DIAGNOSIS — M79602 Pain in left arm: Secondary | ICD-10-CM

## 2019-09-15 DIAGNOSIS — I10 Essential (primary) hypertension: Secondary | ICD-10-CM

## 2019-09-15 DIAGNOSIS — M6281 Muscle weakness (generalized): Secondary | ICD-10-CM

## 2019-09-15 DIAGNOSIS — E785 Hyperlipidemia, unspecified: Secondary | ICD-10-CM

## 2019-09-15 DIAGNOSIS — M25612 Stiffness of left shoulder, not elsewhere classified: Secondary | ICD-10-CM

## 2019-09-15 DIAGNOSIS — M25512 Pain in left shoulder: Secondary | ICD-10-CM
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
## 2019-09-16 NOTE — Telephone Encounter (Signed)
Next appt scheduled 6/21 with PCP.

## 2019-09-20 ENCOUNTER — Ambulatory Visit: Payer: Medicaid Other

## 2019-09-22 ENCOUNTER — Ambulatory Visit: Payer: Medicaid Other

## 2019-10-10 ENCOUNTER — Encounter: Payer: Medicaid Other | Admitting: Internal Medicine

## 2019-10-19 ENCOUNTER — Other Ambulatory Visit: Payer: Self-pay | Admitting: Internal Medicine

## 2019-10-19 DIAGNOSIS — K219 Gastro-esophageal reflux disease without esophagitis: Secondary | ICD-10-CM

## 2019-10-19 DIAGNOSIS — E785 Hyperlipidemia, unspecified: Secondary | ICD-10-CM

## 2019-10-19 NOTE — Telephone Encounter (Signed)
Requested medication (s) are due for refill today: Atorvastatin, yes  Requested medication (s) are on the active medication list: yes  Last refill: 09/15/19  Future visit scheduled: no  Notes to clinic: Does Dr Wynetta Emery still care for this pt?  Requested medication (s) are due for refill today: Omeprazole, yes  Requested medication (s) are on the active medication list: yes  Last refill:  yes  Future visit scheduled:no  Notes to clinic:  script per Dr Hannah Beat Requested Prescriptions  Pending Prescriptions Disp Refills   atorvastatin (LIPITOR) 40 MG tablet [Pharmacy Med Name: ATORVASTATIN CALCIUM 40MG  TABLET] 30 tablet 2    Sig: TAKE 1 TABLET (40 MG TOTAL) BY MOUTH DAILY.      Cardiovascular:  Antilipid - Statins Failed - 10/19/2019  5:44 PM      Failed - LDL in normal range and within 360 days    LDL Chol Calc (NIH)  Date Value Ref Range Status  03/29/2019 54 0 - 99 mg/dL Final          Passed - Total Cholesterol in normal range and within 360 days    Cholesterol, Total  Date Value Ref Range Status  03/29/2019 124 100 - 199 mg/dL Final          Passed - HDL in normal range and within 360 days    HDL  Date Value Ref Range Status  03/29/2019 58 >39 mg/dL Final          Passed - Triglycerides in normal range and within 360 days    Triglycerides  Date Value Ref Range Status  03/29/2019 55 0 - 149 mg/dL Final          Passed - Patient is not pregnant      Passed - Valid encounter within last 12 months    Recent Outpatient Visits           3 months ago [place], Deborah B, MD   4 months ago Essential hypertension   [place], Deborah B, MD   7 months ago Essential hypertension   [place], MD                omeprazole (PRILOSEC) 20 MG capsule [Pharmacy Med Name: OMEPRAZOLE 20MG  CAPSULE] 30 capsule 3     Sig: TAKE ONE CAPSULE BY MOUTH ONCE DAILY FOR ACID REFLUX      Gastroenterology: Proton Pump Inhibitors Passed - 10/19/2019  5:44 PM      Passed - Valid encounter within last 12 months    Recent Outpatient Visits           3 months ago [place], MD   4 months ago Essential hypertension   [place], Deborah B, MD   7 months ago Essential hypertension   [place] Ladell [place], MD

## 2019-10-25 ENCOUNTER — Encounter: Payer: Medicaid Other | Admitting: Internal Medicine

## 2019-10-28 ENCOUNTER — Other Ambulatory Visit: Payer: Self-pay | Admitting: *Deleted

## 2019-10-28 ENCOUNTER — Ambulatory Visit (INDEPENDENT_AMBULATORY_CARE_PROVIDER_SITE_OTHER): Payer: Medicaid Other | Admitting: Internal Medicine

## 2019-10-28 ENCOUNTER — Encounter: Payer: Self-pay | Admitting: Internal Medicine

## 2019-10-28 VITALS — BP 177/119 | HR 78 | Temp 98.2°F | Ht 68.0 in | Wt 258.9 lb

## 2019-10-28 DIAGNOSIS — M1A0720 Idiopathic chronic gout, left ankle and foot, without tophus (tophi): Secondary | ICD-10-CM

## 2019-10-28 DIAGNOSIS — I10 Essential (primary) hypertension: Secondary | ICD-10-CM

## 2019-10-28 DIAGNOSIS — N1831 Chronic kidney disease, stage 3a: Secondary | ICD-10-CM

## 2019-10-28 DIAGNOSIS — M79602 Pain in left arm: Secondary | ICD-10-CM

## 2019-10-28 MED ORDER — AMLODIPINE BESY-BENAZEPRIL HCL 10-20 MG PO CAPS: 1.0000 | ORAL_CAPSULE | Freq: Every day | ORAL | 3 refills | Status: DC

## 2019-10-28 MED FILL — AMLODIPINE-BENAZEPRIL 10-20: 10-20 | 90 days supply | Qty: 90 | Fill #0
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
## 2019-10-28 NOTE — Patient Instructions (Addendum)
Jeanette Gamble,  It was a pleasure seeing you in clinic. Today we discussed:   Blood pressure:  I have changed your blood pressure medication to a combination pill of amlodipine-benazapril 10-20mg  daily. Please take this around the same time every day. We will see you in 1-2 weeks for follow up on the blood pressure.  Ongoing shoulder pain: Please continue with physical therapy. You may continue to take Tylenol 3 and Tizanidine as needed for pain.   Gout: Please continue to take allopurinol daily.   I am checking some labs today. I will inform you of any abnormal results.   If you have any questions or concerns, please call our clinic at [phone] between 9am-5pm and after hours call [phone] and ask for the internal medicine resident on call. If you feel you are having a medical emergency please call 911.   Thank you, we look forward to helping you remain healthy!  To schedule an appointment for a COVID vaccine or be added to the vaccine wait list: Go to [email or web address]   OR Go to [email or web address]                  OR Call [phone]                                     OR Call [phone] and select Option 2

## 2019-10-29 LAB — CBC
Hematocrit: 35.2 % (ref 34.0–46.6)
Hemoglobin: 11.9 g/dL (ref 11.1–15.9)
MCH: 29.2 pg (ref 26.6–33.0)
MCHC: 33.8 g/dL (ref 31.5–35.7)
MCV: 87 fL (ref 79–97)
Platelets: 243 10*3/uL (ref 150–450)
RBC: 4.07 x10E6/uL (ref 3.77–5.28)
RDW: 13 % (ref 11.7–15.4)
WBC: 7.4 10*3/uL (ref 3.4–10.8)

## 2019-10-29 LAB — URIC ACID: Uric Acid: 7.5 mg/dL — ABNORMAL HIGH (ref 2.6–6.2)

## 2019-10-29 LAB — BMP8+ANION GAP
Anion Gap: 11 mmol/L (ref 10.0–18.0)
BUN/Creatinine Ratio: 10 (ref 9–23)
BUN: 12 mg/dL (ref 6–20)
CO2: 24 mmol/L (ref 20–29)
Calcium: 9.3 mg/dL (ref 8.7–10.2)
Chloride: 103 mmol/L (ref 96–106)
Creatinine, Ser: 1.16 mg/dL — ABNORMAL HIGH (ref 0.57–1.00)
GFR calc Af Amer: 69 mL/min/{1.73_m2} (ref >59)
GFR calc non Af Amer: 59 mL/min/{1.73_m2} — ABNORMAL LOW (ref >59)
Glucose: 81 mg/dL (ref 65–99)
Potassium: 4.4 mmol/L (ref 3.5–5.2)
Sodium: 138 mmol/L (ref 134–144)

## 2019-10-31 ENCOUNTER — Telehealth: Payer: Self-pay | Admitting: Internal Medicine

## 2019-10-31 DIAGNOSIS — M79602 Pain in left arm: Secondary | ICD-10-CM
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
## 2019-10-31 NOTE — Assessment & Plan Note (Addendum)
Patient with history of poorly controlled hypertension despite being on multiple antihypertensives. BP 177/119 at this visit. She endorses taking her medications; although has not taken before this appointment.  Patient endorses difficulty with keeping up with all of her medications. She endorses occasional headaches but denies any vision changes, dizziness/lightheadedness, chest pain, shortness of breath or focal weakness.   Plan: Discontinue amlodipine and lisinopril individual tablets Start amlodipine-benazepril 10-20mg  combo daily Continue to monitor and encourage medication compliance BP check in 1-2 weeks

## 2019-10-31 NOTE — Assessment & Plan Note (Addendum)
Patient with history of gout affecting the left first MTP joint treated with colchicine for prophylaxis. She was last evaluated for this in March and started on allopurinol for elevated uric acid level. Patient endorses occasionally taking this and notes that she felt like she was having a gout flare few days prior which was relieved with colchicine. Discussed that her allopurinol is to decrease the amount of gout flares. Uric acid at this visit 7.5 (up from 7.1 at last visit). Suspect this to be in setting of medication noncompliance. Patient advised for medication compliance and dietary modifications.   Plan: Continue allopurinol 100mg  daily If uric acid remains elevated at next check, will increase allopurinol dose for goal uric acid <6 mg/dl Colchicine prn for gout flares

## 2019-10-31 NOTE — Assessment & Plan Note (Signed)
This appears to be at baseline with sCr ~1.1-1.2.   Plan: Continue to monitor  Avoid nephrotoxic agents as able

## 2019-10-31 NOTE — Telephone Encounter (Signed)
Referral placed. Thank you

## 2019-10-31 NOTE — Progress Notes (Signed)
Internal Medicine Clinic Attending  I saw and evaluated the patient.  I personally confirmed the key portions of the history and exam documented by Dr. Marva Panda and I reviewed pertinent patient test results.  The assessment, diagnosis, and plan were formulated together and I agree with the documentation in the resident's note. Nonadherence to medical therapy seems to be a major issue for the this patient.  We discussed the importance of blood pressure control and adherence to medication.  To this end I will try to simplify her regimen, she will start a combination amlodipine benazepril and will have a recheck we will slowly add medications ideally once daily medications and monitor responses.

## 2019-10-31 NOTE — Telephone Encounter (Signed)
Rec' call from the patient requesting a New Referral  Pt's old referral for Physical therapy with the [facility] has expired.  Per pt she was instructed to call for a new Referral to be placed for Ins purposes with Medicaid.

## 2019-11-03 ENCOUNTER — Ambulatory Visit: Payer: Medicaid Other | Attending: Internal Medicine

## 2019-11-03 DIAGNOSIS — M25512 Pain in left shoulder: Secondary | ICD-10-CM | POA: Insufficient documentation

## 2019-11-03 DIAGNOSIS — M542 Cervicalgia: Secondary | ICD-10-CM | POA: Insufficient documentation

## 2019-11-03 DIAGNOSIS — G8929 Other chronic pain: Secondary | ICD-10-CM | POA: Insufficient documentation

## 2019-11-11 ENCOUNTER — Encounter: Payer: Self-pay | Admitting: Internal Medicine

## 2019-11-11 ENCOUNTER — Ambulatory Visit (INDEPENDENT_AMBULATORY_CARE_PROVIDER_SITE_OTHER): Payer: Medicaid Other | Admitting: Internal Medicine

## 2019-11-11 DIAGNOSIS — I10 Essential (primary) hypertension: Secondary | ICD-10-CM | POA: Diagnosis not present

## 2019-11-11 DIAGNOSIS — M1A0720 Idiopathic chronic gout, left ankle and foot, without tophus (tophi): Secondary | ICD-10-CM

## 2019-11-11 MED ORDER — COLCHICINE 0.6 MG PO TABS: 0.6000 mg | ORAL_TABLET | Freq: Every day | ORAL | 2 refills | Status: DC

## 2019-11-11 MED ORDER — ALLOPURINOL 100 MG PO TABS: 200.0000 mg | ORAL_TABLET | Freq: Every day | ORAL | 2 refills | Status: DC
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
## 2019-11-11 NOTE — Assessment & Plan Note (Signed)
Patient presenting with one day of right first MTP joint pain, swelling and erythema similar to prior gout attacks. She endorses medication compliance with allopurinol since her last visit and denies any dietary indiscretions. Uric acid at last visit was elevated to 7.5. On examination, her right first MTP joint is edematous with surrounding erythema and is significantly tender to palpation. Consistent with gout flare.  Plan: Increase allopurinol to 200mg  daily Colchicine 1.2-1.8mg  today followed by 0.6mg  tablets 1-2 times/day prn until resolution of flare Uric acid level at next visit following resolution of acute flare

## 2019-11-11 NOTE — Patient Instructions (Addendum)
Ms. Kemnitz,  It was a pleasure seeing you in clinic. Today we discussed:   Gout Flare:  Please increase your allopurinol to 200mg  daily (2 tablets). I am also sending in prescription for colchicine. Please take 1.2mg  (2 tablets) as your first dose today. You may take an additional tablet one hour after the first dose today. Following this, you may take colchicine 1-2 times daily as needed until your flare resolves. Please continue to take allopurinol 200mg  daily even after your flare. We will follow up on your uric acid levels at your next visit and adjust medication as needed.  Hypertension: Please continue to take your BP medications as needed. We will follow up on this at your next visit.   If you have any questions or concerns, please call our clinic at [phone] between 9am-5pm and after hours call [phone] and ask for the internal medicine resident on call. If you feel you are having a medical emergency please call 911.   Thank you, we look forward to helping you remain healthy!  If you have not already done so, I recommend getting your COVID 19 shot. To schedule an appointment for a COVID vaccine or be added to the vaccine wait list: Go to [email or web address]   OR Go to [email or web address]                  OR Call [phone]                                     OR Call [phone] and select Option 2

## 2019-11-11 NOTE — Assessment & Plan Note (Signed)
Patient has history of poorly controlled hypertension. At the prior visit, patient was prescribed combination pill to reduce pill burden. She notes that this has not been delivered by the pharmacy yet and she is continuing to take the amlodipine 10mg  and lisinopril 20mg  daily. She denies any headaches, vision changes, chest pain, shortness of breath, lightheadedness/dizziness, or focal weakness.  She will follow up with pharmacy for medication delivery.  Plan: Amlodipine-benazepril 10-20mg  daily  F/u in 2 weeks

## 2019-11-15 NOTE — Progress Notes (Signed)
Internal Medicine Clinic Attending ° °Case discussed with Dr. Aslam  At the time of the visit.  We reviewed the resident’s history and exam and pertinent patient test results.  I agree with the assessment, diagnosis, and plan of care documented in the resident’s note.  °

## 2019-11-16 ENCOUNTER — Ambulatory Visit: Payer: Medicaid Other

## 2019-11-16 ENCOUNTER — Other Ambulatory Visit: Payer: Self-pay

## 2019-11-16 DIAGNOSIS — M542 Cervicalgia: Secondary | ICD-10-CM

## 2019-11-16 DIAGNOSIS — G8929 Other chronic pain: Secondary | ICD-10-CM | POA: Diagnosis not present

## 2019-11-16 DIAGNOSIS — M25512 Pain in left shoulder: Secondary | ICD-10-CM | POA: Diagnosis not present

## 2019-11-18 ENCOUNTER — Other Ambulatory Visit: Payer: Self-pay | Admitting: Internal Medicine

## 2019-11-18 DIAGNOSIS — E785 Hyperlipidemia, unspecified: Secondary | ICD-10-CM

## 2019-11-18 DIAGNOSIS — K219 Gastro-esophageal reflux disease without esophagitis: Secondary | ICD-10-CM

## 2019-11-18 NOTE — Telephone Encounter (Signed)
   Notes to clinic These meds meet protocol, unclear as to if the patient is still with your practice.
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
## 2019-11-25 ENCOUNTER — Other Ambulatory Visit: Payer: Self-pay

## 2019-11-25 ENCOUNTER — Ambulatory Visit (HOSPITAL_COMMUNITY)
Admission: RE | Admit: 2019-11-25 | Discharge: 2019-11-25 | Disposition: A | Discharge status: Home / Self Care | Payer: Medicaid Other | Source: Ambulatory Visit | Attending: Internal Medicine | Admitting: Internal Medicine

## 2019-11-25 ENCOUNTER — Ambulatory Visit (INDEPENDENT_AMBULATORY_CARE_PROVIDER_SITE_OTHER): Payer: Medicaid Other | Admitting: Internal Medicine

## 2019-11-25 ENCOUNTER — Encounter: Payer: Self-pay | Admitting: Internal Medicine

## 2019-11-25 VITALS — BP 157/111 | HR 61 | Temp 97.8°F | Ht 68.0 in | Wt 259.1 lb

## 2019-11-25 DIAGNOSIS — M5000 Cervical disc disorder with myelopathy, unspecified cervical region: Secondary | ICD-10-CM | POA: Insufficient documentation

## 2019-11-25 DIAGNOSIS — I10 Essential (primary) hypertension: Secondary | ICD-10-CM | POA: Diagnosis not present

## 2019-11-25 DIAGNOSIS — R600 Localized edema: Secondary | ICD-10-CM | POA: Insufficient documentation

## 2019-11-25 LAB — POCT URINALYSIS DIPSTICK
Bilirubin, UA: NEGATIVE
Glucose, UA: NEGATIVE
Ketones, UA: NEGATIVE
Leukocytes, UA: NEGATIVE
Nitrite, UA: NEGATIVE
Protein, UA: NEGATIVE
Spec Grav, UA: 1.02 (ref 1.010–1.025)
Urobilinogen, UA: 0.2 E.U./dL
pH, UA: 6.5 (ref 5.0–8.0)
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
## 2019-11-25 NOTE — Patient Instructions (Addendum)
It was nice seeing you today! Thank you for choosing [facility] for your Primary Care.    Today we talked about:   1. High Blood Pressure: The pharmacy will deliver your new medication either tomorrow or on Monday. Please start the Amlodipine-Benzapril when it arrives. Stop the individual Amlodipine, Lisinopril and Carvedilol. We are ordering some additional studies to see if there is additional causes of your blood pressure as well. We will call you to schedule that.   2. I sent in a referral to the neurosurgeon

## 2019-11-28 ENCOUNTER — Ambulatory Visit: Payer: Medicaid Other | Attending: Internal Medicine | Admitting: Physical Therapy

## 2019-11-28 DIAGNOSIS — G8929 Other chronic pain: Secondary | ICD-10-CM | POA: Insufficient documentation

## 2019-11-28 DIAGNOSIS — M542 Cervicalgia: Secondary | ICD-10-CM | POA: Insufficient documentation

## 2019-11-28 DIAGNOSIS — M25512 Pain in left shoulder: Secondary | ICD-10-CM | POA: Insufficient documentation

## 2019-11-28 DIAGNOSIS — M6281 Muscle weakness (generalized): Secondary | ICD-10-CM | POA: Insufficient documentation

## 2019-11-28 DIAGNOSIS — R600 Localized edema: Secondary | ICD-10-CM | POA: Insufficient documentation

## 2019-11-28 DIAGNOSIS — M25612 Stiffness of left shoulder, not elsewhere classified: Secondary | ICD-10-CM | POA: Insufficient documentation

## 2019-11-28 NOTE — Assessment & Plan Note (Addendum)
BP: (!) 157/111  Ms. Cullum states that she has not yet received the amlodipine-Benzapril that was prescribed at her last office visit with Dr. Marva Panda.  Due to this, she has been taking her prior medications that she has at home that includes amlodipine, lisinopril, and Coreg.  She notes that occasionally she does have chest pain with exertion but it does not always occur.  The chest pain will resolve with rest.  Occasionally, she has chest pain at rest as well though.  The pain does not radiate and feels substernal.  She denies any dyspnea on exertion, dizziness, vision changes, orthopnea but does note significant lower extremity edema.   Assessment/plan: Per chart review, patient initially developed gestational hypertension with her first pregnancy around the age of 76, however her medical course was complicated by persistent hypertension post pregnancy.  It appears that approximately 2 years after pregnancy, patient was still experiencing hypertension and at that time she had an MR a that did not note any renal artery stenosis.  Since this time, patient's blood pressure has persistently been elevated and uncontrolled.  Given her very young age at onset, secondary causes of hypertension could certainly be playing a role in this.  Besides the MRA for evaluation of renal artery stenosis, patient did not have any prior secondary work-up.  She is certainly at risk for OSA due to her body habitus.  She has been unable to complete prior sleep studies but is willing to try again.  We will also obtain formal ultrasound of the renal artery for reevaluation.  Work-up has also included an aldosterone/renin that is still pending and a urinalysis that was negative for proteinuria.  EKG was also obtained in the clinic to evaluate for any ischemic changes and was negative.  Stable EKG to prior in 2017.  Pharmacy was contacted and new medication will arrive within the next day or 2.  -Start amlodipine-Benzapril on  arrival to your home -Sleep study -Renal artery duplex -Aldosterone/renin pending -1 month follow-up
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
## 2019-11-28 NOTE — Assessment & Plan Note (Signed)
Jeanette Gamble endorses a several month history of bilateral lower extremity swelling with intermittent episodes of PND as well.  She however denies any overt orthopnea and is able to sleep without having to elevate her upper body.  She denies any cough or sputum production.  Assessment/plan: Patient has a longstanding history of uncontrolled hypertension which most certainly places her at risk of developing hypertensive cardiomyopathy, and subsequently heart failure.  Will obtain echo to assess for this.  -TTE ordered -We will hold off on initiating diuretics until results

## 2019-11-29 ENCOUNTER — Telehealth: Payer: Self-pay | Admitting: Physical Therapy

## 2019-11-29 NOTE — Telephone Encounter (Signed)
Contacted patient regarding no show to appointment yesterday. She reports she overslept and was in pain upon waking. She does plan to attend her next appointment.

## 2019-11-30 ENCOUNTER — Telehealth: Payer: Self-pay | Admitting: Physical Therapy

## 2019-11-30 ENCOUNTER — Ambulatory Visit: Payer: Medicaid Other | Admitting: Physical Therapy

## 2019-11-30 NOTE — Telephone Encounter (Signed)
Contacted patient regarding her second consecutive no-show to physical therapy. She reports her grandmother was sick. She was reminded of the attendance policy and she opted to schedule an appointment for tomorrow.

## 2019-11-30 NOTE — Progress Notes (Signed)
Internal Medicine Clinic Attending  Case discussed with Dr. Basaraba  At the time of the visit.  We reviewed the resident's history and exam and pertinent patient test results.  I agree with the assessment, diagnosis, and plan of care documented in the resident's note.  

## 2019-12-01 ENCOUNTER — Other Ambulatory Visit: Payer: Self-pay

## 2019-12-01 ENCOUNTER — Ambulatory Visit: Payer: Medicaid Other

## 2019-12-01 DIAGNOSIS — M542 Cervicalgia: Secondary | ICD-10-CM | POA: Diagnosis not present

## 2019-12-01 DIAGNOSIS — G8929 Other chronic pain: Secondary | ICD-10-CM | POA: Diagnosis not present

## 2019-12-01 DIAGNOSIS — M6281 Muscle weakness (generalized): Secondary | ICD-10-CM | POA: Diagnosis not present

## 2019-12-01 DIAGNOSIS — M25612 Stiffness of left shoulder, not elsewhere classified: Secondary | ICD-10-CM

## 2019-12-01 DIAGNOSIS — M25512 Pain in left shoulder: Secondary | ICD-10-CM | POA: Diagnosis not present
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
## 2019-12-04 LAB — ALDOSTERONE + RENIN ACTIVITY W/ RATIO
ALDOS/RENIN RATIO: 42.9 — ABNORMAL HIGH (ref 0.0–30.0)
ALDOSTERONE: 12.4 ng/dL (ref 0.0–30.0)
Renin: 0.289 ng/mL/hr (ref 0.167–5.380)

## 2019-12-05 ENCOUNTER — Telehealth: Payer: Self-pay | Admitting: Physical Therapy

## 2019-12-05 ENCOUNTER — Encounter: Payer: Medicaid Other | Admitting: Physical Therapy

## 2019-12-05 ENCOUNTER — Ambulatory Visit: Payer: Medicaid Other | Admitting: Physical Therapy

## 2019-12-05 NOTE — Telephone Encounter (Signed)
LVM regarding missed appointment today and that today is her 4th missed appointment. I referenced the attendance policy given on the first day and after 3 missed appointments, per policy, is grounds for discharge. At this point we are going to discharge her from physical therapy. If she has any comments or concerns she is welcome to call us back.   Hartley Urton PT, DPT, LAT, ATC  12/05/19  9:22 AM

## 2019-12-07 ENCOUNTER — Ambulatory Visit: Payer: Medicaid Other | Admitting: Physical Therapy

## 2019-12-14 ENCOUNTER — Ambulatory Visit: Payer: Medicaid Other

## 2019-12-16 ENCOUNTER — Ambulatory Visit: Payer: Medicaid Other

## 2019-12-19 ENCOUNTER — Ambulatory Visit (HOSPITAL_COMMUNITY): Admission: RE | Admit: 2019-12-19 | Payer: Medicaid Other | Source: Ambulatory Visit

## 2019-12-19 ENCOUNTER — Ambulatory Visit (HOSPITAL_COMMUNITY): Payer: Medicaid Other

## 2019-12-20 ENCOUNTER — Telehealth: Payer: Self-pay | Admitting: Internal Medicine

## 2019-12-20 NOTE — Telephone Encounter (Signed)
Attempted to contact patient to discuss lab results. VM left. Please see my results note for aldosterone/renin for more details.

## 2019-12-20 NOTE — Progress Notes (Signed)
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 Discussed results with Dr. Philipp Ovens and Dr. Heber Westport. Recommend patient have confirmatory testing (oral sodium loading versus saline infusion test). Patient's BP may be too uncontrolled for sodium loading, in which case the saline infusion test may be better.

## 2019-12-21 ENCOUNTER — Telehealth: Payer: Self-pay | Admitting: Student

## 2019-12-21 ENCOUNTER — Telehealth: Payer: Self-pay | Admitting: Internal Medicine

## 2019-12-21 NOTE — Telephone Encounter (Signed)
Pt sch for Friday 12/23/2019 with Dr. Eileen Stanford already and is stating she was bitten on her left eye and it is swollen, red, and hurting and causes headaches.  Please call patient back.

## 2019-12-21 NOTE — Telephone Encounter (Addendum)
Ms. Fiola called in for concern of a possible bug bite on her left eye, which is now swollen, red, and painful.  She states that it started on Sunday and has gotten progressively worse.  She reportedly did not see the bug but assumed that it was a bug bite.  She described that only her upper eyelid is swollen and red.  The lower is not affected.  Patient has been taking Benadryl with minimal relief and has been trying warm compress. She denied urticaria, itchy throat, loss of vision even though her sight is a little blurry.  She endorsed mild eye pain with eye movements especially at the outer corner.  She denied pus drainage from her eye or redness of her eye.  She denied fevers or chills.  This seems to fit the picture of a stye.  I advised her to continue warm compression and stop taking Benadryl.  She can try NSAIDs for pain.  Patient will follow up with the upcoming appointment this Friday, 12/23/2019 with Dr. Eileen Stanford.  I also advised her to call back the office tomorrow or go to urgent care/ED if symptoms are worsened such that loss of vision or unbearable pain.  She voiced understand.  All questions were answered.   I also informed her of the abnormal labs for her underlying hypertension and she will need further testing on her upcoming appointment.   Gaylan Gerold, DO Internal Medicine Residency My pager: [phone]

## 2019-12-21 NOTE — Telephone Encounter (Signed)
Forwarding to Yellow Team as they are calling patient regarding lab results. Hubbard Hartshorn, BSN, RN-BC

## 2019-12-21 NOTE — Telephone Encounter (Signed)
Pls contact pt regarding results [phone]

## 2019-12-21 NOTE — Telephone Encounter (Signed)
Dr. Alfonse Spruce called patient.   Thanks

## 2019-12-22 ENCOUNTER — Ambulatory Visit (HOSPITAL_COMMUNITY): Admission: RE | Admit: 2019-12-22 | Payer: Medicaid Other | Source: Ambulatory Visit

## 2019-12-22 ENCOUNTER — Encounter (HOSPITAL_COMMUNITY): Payer: Medicaid Other

## 2019-12-23 ENCOUNTER — Encounter: Payer: Medicaid Other | Admitting: Internal Medicine

## 2019-12-27 ENCOUNTER — Encounter: Payer: Medicaid Other | Admitting: Student

## 2020-01-05 ENCOUNTER — Ambulatory Visit (HOSPITAL_COMMUNITY): Admission: RE | Admit: 2020-01-05 | Payer: Medicaid Other | Source: Ambulatory Visit

## 2020-01-09 ENCOUNTER — Other Ambulatory Visit: Payer: Self-pay | Admitting: Internal Medicine

## 2020-01-09 DIAGNOSIS — G8929 Other chronic pain: Secondary | ICD-10-CM

## 2020-01-09 DIAGNOSIS — I10 Essential (primary) hypertension: Secondary | ICD-10-CM

## 2020-01-09 DIAGNOSIS — M545 Low back pain, unspecified: Secondary | ICD-10-CM

## 2020-01-09 DIAGNOSIS — M79602 Pain in left arm: Secondary | ICD-10-CM

## 2020-01-11 ENCOUNTER — Ambulatory Visit (HOSPITAL_COMMUNITY): Admission: RE | Admit: 2020-01-11 | Payer: Medicaid Other | Source: Ambulatory Visit

## 2020-01-11 ENCOUNTER — Encounter: Payer: Medicaid Other | Admitting: Student

## 2020-01-16 DIAGNOSIS — M4712 Other spondylosis with myelopathy, cervical region: Secondary | ICD-10-CM | POA: Diagnosis not present

## 2020-01-16 DIAGNOSIS — Z6839 Body mass index (BMI) 39.0-39.9, adult: Secondary | ICD-10-CM | POA: Diagnosis not present

## 2020-01-16 DIAGNOSIS — I10 Essential (primary) hypertension: Secondary | ICD-10-CM | POA: Diagnosis not present

## 2020-01-16 DIAGNOSIS — M5030 Other cervical disc degeneration, unspecified cervical region: Secondary | ICD-10-CM | POA: Diagnosis not present

## 2020-01-17 ENCOUNTER — Emergency Department (HOSPITAL_COMMUNITY): Payer: Medicaid Other

## 2020-01-17 ENCOUNTER — Encounter (HOSPITAL_COMMUNITY): Payer: Self-pay | Admitting: Emergency Medicine

## 2020-01-17 ENCOUNTER — Other Ambulatory Visit: Payer: Self-pay

## 2020-01-17 ENCOUNTER — Inpatient Hospital Stay (HOSPITAL_COMMUNITY)
Admission: EM | Admit: 2020-01-17 | Discharge: 2020-01-20 | DRG: 287 | Disposition: A | Discharge status: Home / Self Care | Payer: Medicaid Other | Attending: Interventional Cardiology | Admitting: Interventional Cardiology

## 2020-01-17 DIAGNOSIS — Z716 Tobacco abuse counseling: Secondary | ICD-10-CM

## 2020-01-17 DIAGNOSIS — Z8249 Family history of ischemic heart disease and other diseases of the circulatory system: Secondary | ICD-10-CM

## 2020-01-17 DIAGNOSIS — R079 Chest pain, unspecified: Secondary | ICD-10-CM | POA: Diagnosis not present

## 2020-01-17 DIAGNOSIS — R0602 Shortness of breath: Secondary | ICD-10-CM | POA: Diagnosis not present

## 2020-01-17 DIAGNOSIS — Z20822 Contact with and (suspected) exposure to covid-19: Secondary | ICD-10-CM | POA: Diagnosis present

## 2020-01-17 DIAGNOSIS — F17210 Nicotine dependence, cigarettes, uncomplicated: Secondary | ICD-10-CM | POA: Diagnosis present

## 2020-01-17 DIAGNOSIS — M109 Gout, unspecified: Secondary | ICD-10-CM | POA: Diagnosis present

## 2020-01-17 DIAGNOSIS — N1831 Chronic kidney disease, stage 3a: Secondary | ICD-10-CM | POA: Diagnosis present

## 2020-01-17 DIAGNOSIS — R778 Other specified abnormalities of plasma proteins: Secondary | ICD-10-CM

## 2020-01-17 DIAGNOSIS — R110 Nausea: Secondary | ICD-10-CM | POA: Diagnosis not present

## 2020-01-17 DIAGNOSIS — I160 Hypertensive urgency: Secondary | ICD-10-CM | POA: Diagnosis present

## 2020-01-17 DIAGNOSIS — I77810 Thoracic aortic ectasia: Secondary | ICD-10-CM | POA: Diagnosis present

## 2020-01-17 DIAGNOSIS — E785 Hyperlipidemia, unspecified: Secondary | ICD-10-CM | POA: Diagnosis present

## 2020-01-17 DIAGNOSIS — Z6841 Body Mass Index (BMI) 40.0 and over, adult: Secondary | ICD-10-CM

## 2020-01-17 DIAGNOSIS — I10 Essential (primary) hypertension: Secondary | ICD-10-CM | POA: Diagnosis not present

## 2020-01-17 DIAGNOSIS — I248 Other forms of acute ischemic heart disease: Principal | ICD-10-CM | POA: Diagnosis present

## 2020-01-17 DIAGNOSIS — R748 Abnormal levels of other serum enzymes: Secondary | ICD-10-CM | POA: Diagnosis not present

## 2020-01-17 DIAGNOSIS — R0789 Other chest pain: Secondary | ICD-10-CM | POA: Diagnosis not present

## 2020-01-17 DIAGNOSIS — I129 Hypertensive chronic kidney disease with stage 1 through stage 4 chronic kidney disease, or unspecified chronic kidney disease: Secondary | ICD-10-CM | POA: Diagnosis present

## 2020-01-17 DIAGNOSIS — Z79899 Other long term (current) drug therapy: Secondary | ICD-10-CM

## 2020-01-17 DIAGNOSIS — K219 Gastro-esophageal reflux disease without esophagitis: Secondary | ICD-10-CM | POA: Diagnosis present

## 2020-01-17 LAB — BASIC METABOLIC PANEL
Anion gap: 9 (ref 5–15)
BUN: 15 mg/dL (ref 6–20)
CO2: 24 mmol/L (ref 22–32)
Calcium: 8.8 mg/dL — ABNORMAL LOW (ref 8.9–10.3)
Chloride: 107 mmol/L (ref 98–111)
Creatinine, Ser: 1.21 mg/dL — ABNORMAL HIGH (ref 0.44–1.00)
GFR calc Af Amer: >60 mL/min (ref >60)
GFR calc non Af Amer: 56 mL/min — ABNORMAL LOW (ref >60)
Glucose, Bld: 77 mg/dL (ref 70–99)
Potassium: 3.5 mmol/L (ref 3.5–5.1)
Sodium: 140 mmol/L (ref 135–145)

## 2020-01-17 LAB — CBC
HCT: 35.9 % — ABNORMAL LOW (ref 36.0–46.0)
Hemoglobin: 11.3 g/dL — ABNORMAL LOW (ref 12.0–15.0)
MCH: 28.8 pg (ref 26.0–34.0)
MCHC: 31.5 g/dL (ref 30.0–36.0)
MCV: 91.6 fL (ref 80.0–100.0)
Platelets: 214 10*3/uL (ref 150–400)
RBC: 3.92 MIL/uL (ref 3.87–5.11)
RDW: 14.2 % (ref 11.5–15.5)
WBC: 8.4 10*3/uL (ref 4.0–10.5)
nRBC: 0 % (ref 0.0–0.2)

## 2020-01-17 LAB — TROPONIN I (HIGH SENSITIVITY): Troponin I (High Sensitivity): 38 ng/L — ABNORMAL HIGH (ref <18)

## 2020-01-17 LAB — PROTIME-INR
INR: 1 (ref 0.8–1.2)
Prothrombin Time: 12.7 seconds (ref 11.4–15.2)

## 2020-01-17 LAB — I-STAT BETA HCG BLOOD, ED (MC, WL, AP ONLY): I-stat hCG, quantitative: <5 m[IU]/mL (ref <5)

## 2020-01-17 NOTE — ED Triage Notes (Signed)
Patient arrived with EMS from home reports left upper chest pain with SOB this evening , denies fever or diaphoresis , she received ASA 324 mg , 2 NTG sl and Zofran 4 mg IV by EMS prior to arrival .

## 2020-01-18 ENCOUNTER — Encounter (HOSPITAL_COMMUNITY)
Admission: EM | Disposition: A | Discharge status: Home / Self Care | Payer: Self-pay | Source: Home / Self Care | Attending: Interventional Cardiology

## 2020-01-18 ENCOUNTER — Observation Stay (HOSPITAL_BASED_OUTPATIENT_CLINIC_OR_DEPARTMENT_OTHER): Payer: Medicaid Other

## 2020-01-18 DIAGNOSIS — I351 Nonrheumatic aortic (valve) insufficiency: Secondary | ICD-10-CM | POA: Diagnosis not present

## 2020-01-18 DIAGNOSIS — I200 Unstable angina: Secondary | ICD-10-CM

## 2020-01-18 DIAGNOSIS — Z720 Tobacco use: Secondary | ICD-10-CM | POA: Diagnosis not present

## 2020-01-18 DIAGNOSIS — R079 Chest pain, unspecified: Secondary | ICD-10-CM | POA: Diagnosis not present

## 2020-01-18 DIAGNOSIS — I214 Non-ST elevation (NSTEMI) myocardial infarction: Secondary | ICD-10-CM | POA: Diagnosis not present

## 2020-01-18 DIAGNOSIS — I10 Essential (primary) hypertension: Secondary | ICD-10-CM | POA: Diagnosis not present

## 2020-01-18 DIAGNOSIS — R778 Other specified abnormalities of plasma proteins: Secondary | ICD-10-CM

## 2020-01-18 DIAGNOSIS — E782 Mixed hyperlipidemia: Secondary | ICD-10-CM | POA: Diagnosis not present

## 2020-01-18 HISTORY — PX: LEFT HEART CATH AND CORONARY ANGIOGRAPHY: CATH118249

## 2020-01-18 LAB — RESPIRATORY PANEL BY RT PCR (FLU A&B, COVID)
Influenza A by PCR: NEGATIVE
Influenza B by PCR: NEGATIVE
SARS Coronavirus 2 by RT PCR: NEGATIVE

## 2020-01-18 LAB — ECHOCARDIOGRAM COMPLETE
Area-P 1/2: 2.13 cm2
Height: 68 in
P 1/2 time: 671 msec
S' Lateral: 3 cm
Weight: 4585.57 oz

## 2020-01-18 LAB — HEMOGLOBIN A1C
Hgb A1c MFr Bld: 5.4 % (ref 4.8–5.6)
Mean Plasma Glucose: 108.28 mg/dL

## 2020-01-18 LAB — TROPONIN I (HIGH SENSITIVITY)
Troponin I (High Sensitivity): 104 ng/L (ref <18)
Troponin I (High Sensitivity): 147 ng/L (ref <18)
Troponin I (High Sensitivity): 180 ng/L (ref <18)

## 2020-01-18 LAB — HIV ANTIBODY (ROUTINE TESTING W REFLEX): HIV Screen 4th Generation wRfx: NONREACTIVE

## 2020-01-18 LAB — HEPARIN LEVEL (UNFRACTIONATED): Heparin Unfractionated: 0.24 IU/mL — ABNORMAL LOW (ref 0.30–0.70)

## 2020-01-18 SURGERY — LEFT HEART CATH AND CORONARY ANGIOGRAPHY
Anesthesia: LOCAL

## 2020-01-18 MED ORDER — ONDANSETRON HCL 4 MG/2ML IJ SOLN
4.0000 mg | Freq: Four times a day (QID) | INTRAMUSCULAR | Status: DC | PRN
  Administered 2020-01-18: 4 mg via INTRAVENOUS
  Filled 2020-01-18: qty 2

## 2020-01-18 MED ORDER — HEPARIN SODIUM (PORCINE) 1000 UNIT/ML IJ SOLN
INTRAMUSCULAR | Status: AC
  Filled 2020-01-18: qty 1

## 2020-01-18 MED ORDER — LIDOCAINE HCL (PF) 1 % IJ SOLN
INTRAMUSCULAR | Status: AC
  Filled 2020-01-18: qty 30

## 2020-01-18 MED ORDER — MIDAZOLAM HCL 2 MG/2ML IJ SOLN
INTRAMUSCULAR | Status: DC | PRN
  Administered 2020-01-18: 2 mg via INTRAVENOUS

## 2020-01-18 MED ORDER — HEPARIN SODIUM (PORCINE) 1000 UNIT/ML IJ SOLN
INTRAMUSCULAR | Status: DC | PRN
  Administered 2020-01-18: 6000 [IU] via INTRAVENOUS

## 2020-01-18 MED ORDER — MIDAZOLAM HCL 2 MG/2ML IJ SOLN
INTRAMUSCULAR | Status: AC
  Filled 2020-01-18: qty 2

## 2020-01-18 MED ORDER — ASPIRIN EC 81 MG PO TBEC
81.0000 mg | DELAYED_RELEASE_TABLET | Freq: Every day | ORAL | Status: DC
  Administered 2020-01-19 – 2020-01-20 (×2): 81 mg via ORAL
  Filled 2020-01-18 (×2): qty 1

## 2020-01-18 MED ORDER — AMLODIPINE BESYLATE 10 MG PO TABS
10.0000 mg | ORAL_TABLET | Freq: Every day | ORAL | Status: DC
  Administered 2020-01-18 – 2020-01-20 (×3): 10 mg via ORAL
  Filled 2020-01-18 (×2): qty 1
  Filled 2020-01-18: qty 2

## 2020-01-18 MED ORDER — SODIUM CHLORIDE 0.9% FLUSH
3.0000 mL | Freq: Two times a day (BID) | INTRAVENOUS | Status: DC
  Administered 2020-01-19 – 2020-01-20 (×3): 3 mL via INTRAVENOUS

## 2020-01-18 MED ORDER — HEPARIN (PORCINE) 25000 UT/250ML-% IV SOLN
1400.0000 [IU]/h | INTRAVENOUS | Status: DC
  Administered 2020-01-18: 1200 [IU]/h via INTRAVENOUS
  Filled 2020-01-18: qty 250

## 2020-01-18 MED ORDER — FERROUS SULFATE 325 (65 FE) MG PO TABS
325.0000 mg | ORAL_TABLET | Freq: Two times a day (BID) | ORAL | Status: DC
  Administered 2020-01-18 – 2020-01-20 (×4): 325 mg via ORAL
  Filled 2020-01-18 (×4): qty 1

## 2020-01-18 MED ORDER — FENTANYL CITRATE (PF) 100 MCG/2ML IJ SOLN
INTRAMUSCULAR | Status: DC | PRN
Start: 2020-01-18 — End: 2020-01-18
  Administered 2020-01-18: 25 ug via INTRAVENOUS

## 2020-01-18 MED ORDER — SODIUM CHLORIDE 0.9 % WEIGHT BASED INFUSION
1.0000 mL/kg/h | INTRAVENOUS | Status: DC
  Administered 2020-01-18: 1 mL/kg/h via INTRAVENOUS

## 2020-01-18 MED ORDER — ATORVASTATIN CALCIUM 40 MG PO TABS
40.0000 mg | ORAL_TABLET | Freq: Every day | ORAL | Status: DC
  Administered 2020-01-18 – 2020-01-20 (×3): 40 mg via ORAL
  Filled 2020-01-18 (×3): qty 1

## 2020-01-18 MED ORDER — ASPIRIN 300 MG RE SUPP: 300.0000 mg | RECTAL | Status: AC

## 2020-01-18 MED ORDER — SODIUM CHLORIDE 0.9% FLUSH: 3.0000 mL | INTRAVENOUS | Status: DC | PRN

## 2020-01-18 MED ORDER — SODIUM CHLORIDE 0.9 % WEIGHT BASED INFUSION: 3.0000 mL/kg/h | INTRAVENOUS | Status: DC

## 2020-01-18 MED ORDER — ASPIRIN 81 MG PO CHEW
324.0000 mg | CHEWABLE_TABLET | ORAL | Status: AC
  Administered 2020-01-18: 324 mg via ORAL
  Filled 2020-01-18: qty 4

## 2020-01-18 MED ORDER — HYDRALAZINE HCL 20 MG/ML IJ SOLN: 10.0000 mg | INTRAMUSCULAR | Status: DC | PRN

## 2020-01-18 MED ORDER — SODIUM CHLORIDE 0.9 % IV SOLN: 250.0000 mL | INTRAVENOUS | Status: DC | PRN

## 2020-01-18 MED ORDER — ACETAMINOPHEN 325 MG PO TABS
650.0000 mg | ORAL_TABLET | ORAL | Status: DC | PRN
  Administered 2020-01-18 – 2020-01-19 (×2): 650 mg via ORAL
  Filled 2020-01-18 (×2): qty 2

## 2020-01-18 MED ORDER — NITROGLYCERIN 0.4 MG SL SUBL
0.4000 mg | SUBLINGUAL_TABLET | SUBLINGUAL | Status: DC | PRN
  Administered 2020-01-19: 0.4 mg via SUBLINGUAL
  Filled 2020-01-18: qty 1

## 2020-01-18 MED ORDER — SODIUM CHLORIDE 0.9 % IV SOLN: INTRAVENOUS | Status: AC

## 2020-01-18 MED ORDER — FENTANYL CITRATE (PF) 100 MCG/2ML IJ SOLN
INTRAMUSCULAR | Status: AC
  Filled 2020-01-18: qty 2

## 2020-01-18 MED ORDER — HYDRALAZINE HCL 25 MG PO TABS
25.0000 mg | ORAL_TABLET | Freq: Three times a day (TID) | ORAL | Status: DC
  Administered 2020-01-18 – 2020-01-19 (×6): 25 mg via ORAL
  Filled 2020-01-18 (×6): qty 1

## 2020-01-18 MED ORDER — IOHEXOL 350 MG/ML SOLN
INTRAVENOUS | Status: DC | PRN
  Administered 2020-01-18: 55 mL

## 2020-01-18 MED ORDER — ASPIRIN 81 MG PO CHEW: 81.0000 mg | CHEWABLE_TABLET | ORAL | Status: DC

## 2020-01-18 MED ORDER — VERAPAMIL HCL 2.5 MG/ML IV SOLN
INTRAVENOUS | Status: DC | PRN
  Administered 2020-01-18: 10 mL via INTRA_ARTERIAL

## 2020-01-18 MED ORDER — MORPHINE SULFATE (PF) 4 MG/ML IV SOLN
4.0000 mg | Freq: Once | INTRAVENOUS | Status: AC
  Administered 2020-01-18: 4 mg via INTRAVENOUS
  Filled 2020-01-18: qty 1

## 2020-01-18 MED ORDER — SODIUM CHLORIDE 0.9% FLUSH
3.0000 mL | Freq: Two times a day (BID) | INTRAVENOUS | Status: DC
  Administered 2020-01-18 – 2020-01-19 (×2): 3 mL via INTRAVENOUS

## 2020-01-18 MED ORDER — LIDOCAINE HCL (PF) 1 % IJ SOLN
INTRAMUSCULAR | Status: DC | PRN
  Administered 2020-01-18: 2 mL

## 2020-01-18 MED ORDER — HEPARIN BOLUS VIA INFUSION
4000.0000 [IU] | Freq: Once | INTRAVENOUS | Status: AC
  Administered 2020-01-18: 4000 [IU] via INTRAVENOUS
  Filled 2020-01-18: qty 4000

## 2020-01-18 MED ORDER — HEPARIN (PORCINE) IN NACL 1000-0.9 UT/500ML-% IV SOLN
INTRAVENOUS | Status: AC
  Filled 2020-01-18: qty 1000

## 2020-01-18 MED ORDER — VERAPAMIL HCL 2.5 MG/ML IV SOLN
INTRAVENOUS | Status: AC
  Filled 2020-01-18: qty 2

## 2020-01-18 MED ORDER — HEPARIN (PORCINE) IN NACL 1000-0.9 UT/500ML-% IV SOLN
INTRAVENOUS | Status: DC | PRN
  Administered 2020-01-18 (×2): 500 mL

## 2020-01-18 SURGICAL SUPPLY — 11 items
CATH INFINITI 5FR JK (CATHETERS) ×1 IMPLANT
CATH INFINITI JR4 5F (CATHETERS) ×1 IMPLANT
DEVICE RAD COMP TR BAND LRG (VASCULAR PRODUCTS) ×1 IMPLANT
GLIDESHEATH SLEND SS 6F .021 (SHEATH) ×1 IMPLANT
GUIDEWIRE INQWIRE 1.5J.035X260 (WIRE) IMPLANT
INQWIRE 1.5J .035X260CM (WIRE) ×2
KIT HEART LEFT (KITS) ×2 IMPLANT
PACK CARDIAC CATHETERIZATION (CUSTOM PROCEDURE TRAY) ×2 IMPLANT
SHEATH PROBE COVER 6X72 (BAG) ×1 IMPLANT
TRANSDUCER W/STOPCOCK (MISCELLANEOUS) ×2 IMPLANT
TUBING CIL FLEX 10 FLL-RA (TUBING) ×2 IMPLANT

## 2020-01-18 NOTE — Progress Notes (Signed)
  Echocardiogram 2D Echocardiogram has been performed.  Jeanette Gamble 01/18/2020, 9:49 AM

## 2020-01-18 NOTE — ED Notes (Signed)
Walked patient to the bathroom patient did well 
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
## 2020-01-18 NOTE — ED Notes (Signed)
Report given to harriet RN in cath lab.

## 2020-01-18 NOTE — Interval H&P Note (Signed)
Cath Lab Visit (complete for each Cath Lab visit)  Clinical Evaluation Leading to the Procedure:   ACS: Yes.    Non-ACS:   N/a      History and Physical Interval Note:  01/18/2020 5:52 PM  Jeanette Gamble  has presented today for surgery, with the diagnosis of nstemi.  The various methods of treatment have been discussed with the patient and family. After consideration of risks, benefits and other options for treatment, the patient has consented to  Procedure(s): LEFT HEART CATH AND CORONARY ANGIOGRAPHY (N/A) as a surgical intervention.  The patient's history has been reviewed, patient examined, no change in status, stable for surgery.  I have reviewed the patient's chart and labs.  Questions were answered to the patient's satisfaction.     [name]

## 2020-01-18 NOTE — Progress Notes (Signed)
Dr. Fletcher Anon at bedside talking w/patient; aware of c/o chest pain. Groggy.
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
## 2020-01-19 ENCOUNTER — Encounter (HOSPITAL_COMMUNITY): Payer: Self-pay | Admitting: Cardiovascular Disease

## 2020-01-19 DIAGNOSIS — R778 Other specified abnormalities of plasma proteins: Secondary | ICD-10-CM

## 2020-01-19 DIAGNOSIS — I160 Hypertensive urgency: Secondary | ICD-10-CM | POA: Diagnosis not present

## 2020-01-19 DIAGNOSIS — I129 Hypertensive chronic kidney disease with stage 1 through stage 4 chronic kidney disease, or unspecified chronic kidney disease: Secondary | ICD-10-CM | POA: Diagnosis present

## 2020-01-19 DIAGNOSIS — I248 Other forms of acute ischemic heart disease: Secondary | ICD-10-CM | POA: Diagnosis present

## 2020-01-19 DIAGNOSIS — N1831 Chronic kidney disease, stage 3a: Secondary | ICD-10-CM | POA: Diagnosis present

## 2020-01-19 DIAGNOSIS — I10 Essential (primary) hypertension: Secondary | ICD-10-CM | POA: Diagnosis not present

## 2020-01-19 DIAGNOSIS — Z20822 Contact with and (suspected) exposure to covid-19: Secondary | ICD-10-CM | POA: Diagnosis present

## 2020-01-19 DIAGNOSIS — R072 Precordial pain: Secondary | ICD-10-CM

## 2020-01-19 DIAGNOSIS — Z6841 Body Mass Index (BMI) 40.0 and over, adult: Secondary | ICD-10-CM | POA: Diagnosis not present

## 2020-01-19 DIAGNOSIS — M109 Gout, unspecified: Secondary | ICD-10-CM | POA: Diagnosis present

## 2020-01-19 DIAGNOSIS — K219 Gastro-esophageal reflux disease without esophagitis: Secondary | ICD-10-CM | POA: Diagnosis present

## 2020-01-19 DIAGNOSIS — I77810 Thoracic aortic ectasia: Secondary | ICD-10-CM | POA: Diagnosis present

## 2020-01-19 DIAGNOSIS — E785 Hyperlipidemia, unspecified: Secondary | ICD-10-CM | POA: Diagnosis present

## 2020-01-19 DIAGNOSIS — Z716 Tobacco abuse counseling: Secondary | ICD-10-CM | POA: Diagnosis not present

## 2020-01-19 DIAGNOSIS — F17210 Nicotine dependence, cigarettes, uncomplicated: Secondary | ICD-10-CM | POA: Diagnosis present

## 2020-01-19 DIAGNOSIS — Z79899 Other long term (current) drug therapy: Secondary | ICD-10-CM | POA: Diagnosis not present

## 2020-01-19 DIAGNOSIS — Z8249 Family history of ischemic heart disease and other diseases of the circulatory system: Secondary | ICD-10-CM | POA: Diagnosis not present

## 2020-01-19 LAB — LIPID PANEL
Cholesterol: 120 mg/dL (ref 0–200)
HDL: 48 mg/dL (ref >40)
LDL Cholesterol: 61 mg/dL (ref 0–99)
Total CHOL/HDL Ratio: 2.5 RATIO
Triglycerides: 53 mg/dL (ref <150)
VLDL: 11 mg/dL (ref 0–40)

## 2020-01-19 LAB — CBC
HCT: 34.5 % — ABNORMAL LOW (ref 36.0–46.0)
Hemoglobin: 11.1 g/dL — ABNORMAL LOW (ref 12.0–15.0)
MCH: 29.5 pg (ref 26.0–34.0)
MCHC: 32.2 g/dL (ref 30.0–36.0)
MCV: 91.8 fL (ref 80.0–100.0)
Platelets: 194 10*3/uL (ref 150–400)
RBC: 3.76 MIL/uL — ABNORMAL LOW (ref 3.87–5.11)
RDW: 13.9 % (ref 11.5–15.5)
WBC: 6.6 10*3/uL (ref 4.0–10.5)
nRBC: 0 % (ref 0.0–0.2)

## 2020-01-19 LAB — BASIC METABOLIC PANEL
Anion gap: 7 (ref 5–15)
BUN: 9 mg/dL (ref 6–20)
CO2: 26 mmol/L (ref 22–32)
Calcium: 8.8 mg/dL — ABNORMAL LOW (ref 8.9–10.3)
Chloride: 104 mmol/L (ref 98–111)
Creatinine, Ser: 1.04 mg/dL — ABNORMAL HIGH (ref 0.44–1.00)
GFR calc Af Amer: >60 mL/min (ref >60)
GFR calc non Af Amer: >60 mL/min (ref >60)
Glucose, Bld: 84 mg/dL (ref 70–99)
Potassium: 3.5 mmol/L (ref 3.5–5.1)
Sodium: 137 mmol/L (ref 135–145)

## 2020-01-19 MED ORDER — TIZANIDINE HCL 4 MG PO TABS
4.0000 mg | ORAL_TABLET | Freq: Three times a day (TID) | ORAL | Status: DC | PRN
  Administered 2020-01-19: 4 mg via ORAL
  Filled 2020-01-19: qty 1

## 2020-01-19 MED ORDER — LISINOPRIL 10 MG PO TABS
10.0000 mg | ORAL_TABLET | Freq: Once | ORAL | Status: AC
  Administered 2020-01-19: 10 mg via ORAL
  Filled 2020-01-19: qty 1

## 2020-01-19 MED ORDER — LISINOPRIL 10 MG PO TABS
20.0000 mg | ORAL_TABLET | Freq: Every day | ORAL | Status: DC
  Administered 2020-01-20: 20 mg via ORAL
  Filled 2020-01-19: qty 2

## 2020-01-19 MED ORDER — HYDRALAZINE HCL 25 MG PO TABS
25.0000 mg | ORAL_TABLET | Freq: Once | ORAL | Status: AC
  Administered 2020-01-19: 25 mg via ORAL
  Filled 2020-01-19: qty 1

## 2020-01-19 MED ORDER — HYDRALAZINE HCL 50 MG PO TABS
50.0000 mg | ORAL_TABLET | Freq: Three times a day (TID) | ORAL | Status: DC
  Administered 2020-01-20 (×2): 50 mg via ORAL
  Filled 2020-01-19 (×2): qty 1

## 2020-01-19 MED ORDER — LISINOPRIL 10 MG PO TABS
10.0000 mg | ORAL_TABLET | Freq: Every day | ORAL | Status: DC
  Administered 2020-01-19: 10 mg via ORAL
  Filled 2020-01-19: qty 1

## 2020-01-19 NOTE — Progress Notes (Signed)
    Called by nursing team regarding patient's persistently elevated BPs.  Awaiting a.m. labs for creatinine level at this time.  Will anticipate uptitrating lisinopril and adding HCTZ if creatinine stable post cath.  Last creatinine from 05/19/2019 at 1.21.  Avoid beta-blocker given sinus bradycardia on telemetry.  MD to follow  Kathyrn Drown NP-[facility] Pager: [phone]

## 2020-01-19 NOTE — Progress Notes (Signed)
@  2113 Dr. Sonia Side, on-call for attending, paged regarding pt's persistently elevated blood pressures (174/118 1 hour post early scheduled hydralazine administration) and also at pt's request to have home Zanaflex restarted.  Page promptly returned and orders received for increased Hydralazine order and Zanaflex. Will administer and continue to monitor.

## 2020-01-19 NOTE — Progress Notes (Signed)
Met w/ pt; Explained visitation policy - her son is 40 YO's; also explained visitation hours were till 8pm. Her son has been with his dad since pt admitted; offered to call anyone if she needed help with his transportation at close of visitation. She declined. Offered face time for them which she acknowedlged they had done. Pt reported she hopes to be discharged tomorrow. Support provided.  

## 2020-01-19 NOTE — Progress Notes (Addendum)
Progress Note  Patient Name: Jeanette Gamble Date of Encounter: 01/19/2020  Primary Cardiologist: New   Subjective   Doing well with no recurrent chest pain. BP remains elevated. Will titrate medications today  Inpatient Medications    Scheduled Meds: . amLODipine  10 mg Oral Daily  . aspirin EC  81 mg Oral Daily  . atorvastatin  40 mg Oral Daily  . ferrous sulfate  325 mg Oral BID WC  . hydrALAZINE  25 mg Oral Q8H  . lisinopril  10 mg Oral Daily  . sodium chloride flush  3 mL Intravenous Q12H  . sodium chloride flush  3 mL Intravenous Q12H   Continuous Infusions: . sodium chloride     PRN Meds: sodium chloride, acetaminophen, nitroGLYCERIN, ondansetron (ZOFRAN) IV, sodium chloride flush   Vital Signs    Vitals:   01/19/20 0300 01/19/20 0331 01/19/20 0400 01/19/20 0426  BP: (!) 163/96 (!) 163/96 (!) 166/101   Pulse: (!) 49 60 (!) 49 (!) 50  Resp: 13 18 17 11   Temp: 97.9 F (36.6 C) 97.9 F (36.6 C)    TempSrc: Oral Oral    SpO2: 100% 99% 99% 99%  Weight:      Height:        Intake/Output Summary (Last 24 hours) at 01/19/2020 0713 Last data filed at 01/19/2020 0131 Gross per 24 hour  Intake 813.75 ml  Output 800 ml  Net 13.75 ml   Filed Weights   01/17/20 2058  Weight: 130 kg    Physical Exam   General: Well developed, well nourished, NAD Neck: Negative for carotid bruits. No JVD Lungs:Clear to ausculation bilaterally. No wheezes, rales, or rhonchi. Breathing is unlabored. Cardiovascular: RRR with S1 S2. No murmurs Abdomen: Soft, non-tender, non-distended. No obvious abdominal masses. Extremities: No edema. Radial pulses 2+ bilaterally Neuro: Alert and oriented. No focal deficits. No facial asymmetry. MAE spontaneously. Psych: Responds to questions appropriately with normal affect.    Labs    Chemistry Recent Labs  Lab 01/17/20 2103  NA 140  K 3.5  CL 107  CO2 24  GLUCOSE 77  BUN 15  CREATININE 1.21*  CALCIUM 8.8*  GFRNONAA 56*   GFRAA >60  ANIONGAP 9     Hematology Recent Labs  Lab 01/17/20 2103 01/19/20 0243  WBC 8.4 6.6  RBC 3.92 3.76*  HGB 11.3* 11.1*  HCT 35.9* 34.5*  MCV 91.6 91.8  MCH 28.8 29.5  MCHC 31.5 32.2  RDW 14.2 13.9  PLT 214 194    Cardiac EnzymesNo results for input(s): TROPONINI in the last 168 hours. No results for input(s): TROPIPOC in the last 168 hours.   BNPNo results for input(s): BNP, PROBNP in the last 168 hours.   DDimer No results for input(s): DDIMER in the last 168 hours.   Radiology    DG Chest 2 View  Result Date: 01/17/2020 CLINICAL DATA:  Chest pain radiating to the left side of the chest and left arm. EXAM: CHEST - 2 VIEW COMPARISON:  None. FINDINGS: The cardiac silhouette is mildly enlarged and unchanged in size. Both lungs are clear. Moderate severity dextroscoliosis of the thoracic spine is seen. IMPRESSION: No active cardiopulmonary disease. Electronically Signed   By: Virgina Norfolk M.D.   On: 01/17/2020 22:22   CARDIAC CATHETERIZATION  Result Date: 01/18/2020 1.  Normal coronary arteries.  The coronary arteries are tortuous likely due to hypertension. 2.  Left ventricular angiography was not performed.  EF was normal by  echo.  LVEDP was mildly elevated at 13 mmHg. Recommendations: No culprit is identified for the patient's chest pain and mildly elevated troponin.  Suspect supply demand ischemia due to elevated blood pressure.  Recommend blood pressure control.  ECHOCARDIOGRAM COMPLETE  Result Date: 01/18/2020    ECHOCARDIOGRAM REPORT   Patient Name:   Jeanette Gamble [facility] Date of Exam: 01/18/2020 Medical Rec #:  [id number]        Height:       68.0 in Accession #:    [id number]       Weight:       286.6 lb Date of Birth:  27-Sep-1980        BSA:          2.381 m Patient Age:    39 years         BP:           156/95 mmHg Patient Gender: F                HR:           51 bpm. Exam Location:  Inpatient Procedure: 2D Echo, Cardiac Doppler and Color Doppler Indications:     Chest pain  History:        Patient has no prior history of Echocardiogram examinations.                 Risk Factors:Hypertension, Dyslipidemia, Current Smoker and                 Obesity. Elevated Troponin.  Sonographer:    Dustin Flock Referring Phys: [place]  1. Left ventricular ejection fraction, by estimation, is 60 to 65%. The left ventricle has normal function. The left ventricle has no regional wall motion abnormalities. There is moderate left ventricular hypertrophy. Left ventricular diastolic parameters are indeterminate.  2. Right ventricular systolic function is normal. The right ventricular size is normal. There is normal pulmonary artery systolic pressure. The estimated right ventricular systolic pressure is 43.1 mmHg.  3. The mitral valve is normal in structure. No evidence of mitral valve regurgitation. No evidence of mitral stenosis.  4. The aortic valve is normal in structure. Aortic valve regurgitation is mild. No aortic stenosis is present. Aortic regurgitation PHT measures 671 msec.  5. Aortic dilatation noted. There is mild dilatation of the ascending aorta, measuring 40 mm.  6. The inferior vena cava is normal in size with greater than 50% respiratory variability, suggesting right atrial pressure of 3 mmHg. FINDINGS  Left Ventricle: Left ventricular ejection fraction, by estimation, is 60 to 65%. The left ventricle has normal function. The left ventricle has no regional wall motion abnormalities. The left ventricular internal cavity size was normal in size. There is  moderate left ventricular hypertrophy. Left ventricular diastolic parameters are indeterminate. Right Ventricle: The right ventricular size is normal. No increase in right ventricular wall thickness. Right ventricular systolic function is normal. There is normal pulmonary artery systolic pressure. The tricuspid regurgitant velocity is 2.38 m/s, and  with an assumed right atrial pressure of 3  mmHg, the estimated right ventricular systolic pressure is 54.0 mmHg. Left Atrium: Left atrial size was normal in size. Right Atrium: Right atrial size was normal in size. Pericardium: There is no evidence of pericardial effusion. Mitral Valve: The mitral valve is normal in structure. No evidence of mitral valve regurgitation. No evidence of mitral valve stenosis. Tricuspid Valve: The tricuspid valve is normal in structure. Tricuspid valve regurgitation is  trivial. No evidence of tricuspid stenosis. Aortic Valve: The aortic valve is normal in structure. Aortic valve regurgitation is mild. Aortic regurgitation PHT measures 671 msec. No aortic stenosis is present. Pulmonic Valve: The pulmonic valve was normal in structure. Pulmonic valve regurgitation is trivial. No evidence of pulmonic stenosis. Aorta: Aortic dilatation noted. There is mild dilatation of the ascending aorta, measuring 40 mm. Venous: The inferior vena cava is normal in size with greater than 50% respiratory variability, suggesting right atrial pressure of 3 mmHg. IAS/Shunts: No atrial level shunt detected by color flow Doppler.  LEFT VENTRICLE PLAX 2D LVIDd:         4.50 cm  Diastology LVIDs:         3.00 cm  LV e' medial:    6.64 cm/s LV PW:         1.50 cm  LV E/e' medial:  16.3 LV IVS:        1.40 cm  LV e' lateral:   9.36 cm/s LVOT diam:     2.20 cm  LV E/e' lateral: 11.5 LV SV:         81 LV SV Index:   34 LVOT Area:     3.80 cm  RIGHT VENTRICLE RV Basal diam:  2.70 cm RV S prime:     12.10 cm/s TAPSE (M-mode): 4.0 cm LEFT ATRIUM           Index       RIGHT ATRIUM           Index LA diam:      3.70 cm 1.55 cm/m  RA Area:     16.70 cm LA Vol (A4C): 24.9 ml 10.46 ml/m RA Volume:   44.80 ml  18.81 ml/m  AORTIC VALVE LVOT Vmax:   94.20 cm/s LVOT Vmean:  67.800 cm/s LVOT VTI:    0.213 m AI PHT:      671 msec  AORTA Ao Root diam: 3.10 cm MITRAL VALVE                TRICUSPID VALVE MV Area (PHT): 2.13 cm     TR Peak grad:   22.7 mmHg MV Decel  Time: 356 msec     TR Vmax:        238.00 cm/s MV E velocity: 108.00 cm/s MV A velocity: 84.50 cm/s   SHUNTS MV E/A ratio:  1.28         Systemic VTI:  0.21 m                             Systemic Diam: 2.20 cm Candee Furbish MD Electronically signed by Candee Furbish MD Signature Date/Time: 01/18/2020/11:36:00 AM    Final    Telemetry    01/19/20 SB in the 50-60's- Personally Reviewed  ECG    No new tracing as of 01/18/20- Personally Reviewed  Cardiac Studies   LHC 01/18/20:  .  Normal coronary arteries.  The coronary arteries are tortuous likely due to hypertension. 2.  Left ventricular angiography was not performed.  EF was normal by echo.  LVEDP was mildly elevated at 13 mmHg.  Recommendations: No culprit is identified for the patient's chest pain and mildly elevated troponin.  Suspect supply demand ischemia due to elevated blood pressure.  Recommend blood pressure control  Echo 01/18/20:  1. Left ventricular ejection fraction, by estimation, is 60 to 65%. The  left ventricle has normal function. The left ventricle  has no regional  wall motion abnormalities. There is moderate left ventricular hypertrophy.  Left ventricular diastolic  parameters are indeterminate.  2. Right ventricular systolic function is normal. The right ventricular  size is normal. There is normal pulmonary artery systolic pressure. The  estimated right ventricular systolic pressure is 91.4 mmHg.  3. The mitral valve is normal in structure. No evidence of mitral valve  regurgitation. No evidence of mitral stenosis.  4. The aortic valve is normal in structure. Aortic valve regurgitation is  mild. No aortic stenosis is present. Aortic regurgitation PHT measures 671  msec.  5. Aortic dilatation noted. There is mild dilatation of the ascending  aorta, measuring 40 mm.  6. The inferior vena cava is normal in size with greater than 50%  respiratory variability, suggesting right atrial pressure of 3 mmHg.   Patient  Profile     39 y.o. female with a hx of HTN, HL, tobacco use, preeclampsia who presented with chest pain and found to have elevated troponin.   Assessment & Plan    1. Chest pain: -LHC performed 01/18/20 with normal coronary arteries. Suspect supply demand ischemia due to elevated blood pressure with recommendations for greater blood pressure control -Echocardiogram with normal LV function, moderate LVH, mild AR and mild dilation of the ascending aorta measuring 38mm -No recurrence today  -Continue ASA for CV risk factor modification   2. HTN: -Elevated, 166/101, 163/96, 163/96 -Currently on amlodipine 10, hydralazine 25, lisinopril 10 -Transition to PTA ACE>>>will wait for AM labs given contrast dye administration yesterday with cath  -Likely will add HCTZ for greater BP control  3. HLD: -LDL, 61 -Continue atorvastatin 40mg  PO QD   4. 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  Agree with above as stated.  Patiejt sleepy this AM.  Had some chest pain last night- addressed by Dr. Kalman Shan.  Brachial pulses are equal bilaterally this AM.  Doubt dissection as cause of pain.  Elevated troponin- hypertensive urgency.  BP still high after lisinopril 10.  Will increase to 20 mg.  Looks like she was on Lotrel 10/20 in the past. Check BMet in AM.  Minimal contrast yesterday since only diagnostic cath so I think kidney function will be ok. Cannot use beta blocker given her bradycardia.  Larae Grooms    For questions or updates, please contact   Please consult [email or web address] for contact info under Cardiology/STEMI.

## 2020-01-19 NOTE — Progress Notes (Addendum)
Pt transferred from cath lab to 4E14, appeared alert and oriented x 4. She's hemodynamically stable at arrival. SB on monitor, HR 44-65.   CCMD called with 2nd person verified. CHG bath given, room oriented, call bell within reach.   Right radial TR Band in place. No bleeding or hematoma. Deflated air per protocol. Complained about pain on right shoulder, Tylenol given. Pt tolerated well. We continue to monitor.  Kennyth Lose, RN

## 2020-01-19 NOTE — Progress Notes (Signed)
Pt complained about chest pain and right shoulder pain scale 8/10 since post procedure.  EKG 12 leads was done. No remarkable for acute MI.  Sinus bradycardia on EKG, HR 40s. BP 150/87- 166/101 mmHg.  Hydralazine schedule was given earlier. Her pain tolerated well after Tylenol given. Pt requested for her muscle relaxant. Paged Dr. Irish Lack. Will hand off to day shift RN.  Continue to monitor.  Kennyth Lose, RN

## 2020-01-19 NOTE — Progress Notes (Signed)
Patient complaint of worsening chest pain 8/10. EKG obtained, vitals taken BP 173/105. Nitro given with minimal improvement. BP rechecked and read 158/108 Kathyrn Drown paged and advised to give BP medications early and continue to monitor. Chest pain now 6/10  [employer]

## 2020-01-20 ENCOUNTER — Other Ambulatory Visit: Payer: Self-pay | Admitting: Cardiology

## 2020-01-20 DIAGNOSIS — I10 Essential (primary) hypertension: Secondary | ICD-10-CM

## 2020-01-20 DIAGNOSIS — Z79899 Other long term (current) drug therapy: Secondary | ICD-10-CM

## 2020-01-20 LAB — BASIC METABOLIC PANEL
Anion gap: 10 (ref 5–15)
BUN: 11 mg/dL (ref 6–20)
CO2: 23 mmol/L (ref 22–32)
Calcium: 8.5 mg/dL — ABNORMAL LOW (ref 8.9–10.3)
Chloride: 104 mmol/L (ref 98–111)
Creatinine, Ser: 1.13 mg/dL — ABNORMAL HIGH (ref 0.44–1.00)
GFR calc Af Amer: >60 mL/min (ref >60)
GFR calc non Af Amer: >60 mL/min (ref >60)
Glucose, Bld: 106 mg/dL — ABNORMAL HIGH (ref 70–99)
Potassium: 3.6 mmol/L (ref 3.5–5.1)
Sodium: 137 mmol/L (ref 135–145)

## 2020-01-20 LAB — CBC
HCT: 34.9 % — ABNORMAL LOW (ref 36.0–46.0)
Hemoglobin: 11.2 g/dL — ABNORMAL LOW (ref 12.0–15.0)
MCH: 29 pg (ref 26.0–34.0)
MCHC: 32.1 g/dL (ref 30.0–36.0)
MCV: 90.4 fL (ref 80.0–100.0)
Platelets: 195 10*3/uL (ref 150–400)
RBC: 3.86 MIL/uL — ABNORMAL LOW (ref 3.87–5.11)
RDW: 13.7 % (ref 11.5–15.5)
WBC: 6.1 10*3/uL (ref 4.0–10.5)
nRBC: 0 % (ref 0.0–0.2)

## 2020-01-20 MED ORDER — HYDROCHLOROTHIAZIDE 25 MG PO TABS: 25.0000 mg | ORAL_TABLET | Freq: Every day | ORAL | Status: DC

## 2020-01-20 MED ORDER — LISINOPRIL 10 MG PO TABS
20.0000 mg | ORAL_TABLET | Freq: Once | ORAL | Status: AC
  Administered 2020-01-20: 20 mg via ORAL
  Filled 2020-01-20: qty 2

## 2020-01-20 MED ORDER — LISINOPRIL 40 MG PO TABS: 40.0000 mg | ORAL_TABLET | Freq: Every day | ORAL | 2 refills | Status: DC

## 2020-01-20 MED ORDER — AMLODIPINE BESYLATE 10 MG PO TABS: 10.0000 mg | ORAL_TABLET | Freq: Every day | ORAL | 2 refills | Status: DC

## 2020-01-20 MED ORDER — HYDROCHLOROTHIAZIDE 25 MG PO TABS: 25.0000 mg | ORAL_TABLET | Freq: Every day | ORAL | 2 refills | Status: DC

## 2020-01-20 MED ORDER — HYDROCHLOROTHIAZIDE 12.5 MG PO CAPS
12.5000 mg | ORAL_CAPSULE | Freq: Every day | ORAL | Status: DC
  Administered 2020-01-20: 12.5 mg via ORAL
  Filled 2020-01-20: qty 1

## 2020-01-20 MED ORDER — ASPIRIN 81 MG PO TBEC: 81.0000 mg | DELAYED_RELEASE_TABLET | Freq: Every day | ORAL | 11 refills | Status: DC

## 2020-01-20 MED ORDER — HYDRALAZINE HCL 50 MG PO TABS: 50.0000 mg | ORAL_TABLET | Freq: Three times a day (TID) | ORAL | 2 refills | Status: DC

## 2020-01-20 MED ORDER — LISINOPRIL 40 MG PO TABS: 40.0000 mg | ORAL_TABLET | Freq: Every day | ORAL | Status: DC

## 2020-01-20 NOTE — Progress Notes (Signed)
Re-applied electrodes and re-applied tele monitor. Pt had removed them. Saw package of [place] cigarette box on bed beside pt. INstructed pt on our smoking policy as well as safety of herself and others related to smoking in room. Pt stated she would not smoke in room.  Discussed discharge conversation she had w/ MD. --Velora Mediate

## 2020-01-20 NOTE — Progress Notes (Addendum)
Progress Note  Patient Name: Jeanette Gamble Date of Encounter: 01/20/2020  Primary Cardiologist: New to [facility]  Subjective   No specific complaints. No chest pain   Inpatient Medications    Scheduled Meds: . amLODipine  10 mg Oral Daily  . aspirin EC  81 mg Oral Daily  . atorvastatin  40 mg Oral Daily  . ferrous sulfate  325 mg Oral BID WC  . hydrALAZINE  50 mg Oral Q8H  . lisinopril  40 mg Oral Daily  . sodium chloride flush  3 mL Intravenous Q12H   Continuous Infusions: . sodium chloride     PRN Meds: sodium chloride, acetaminophen, nitroGLYCERIN, ondansetron (ZOFRAN) IV, sodium chloride flush, tiZANidine   Vital Signs    Vitals:   01/20/20 0026 01/20/20 0445 01/20/20 0500 01/20/20 0815  BP:  (!) 159/93  (!) 151/103  Pulse:    (!) 50  Resp: 20 16 16 15   Temp:   98.2 F (36.8 C) 98.1 F (36.7 C)  TempSrc:    Oral  SpO2: 96%   100%  Weight:      Height:        Intake/Output Summary (Last 24 hours) at 01/20/2020 0909 Last data filed at 01/20/2020 0820 Gross per 24 hour  Intake 849 ml  Output 350 ml  Net 499 ml   Filed Weights   01/17/20 2058  Weight: 130 kg    Physical Exam   General: Well developed, well nourished, NAD Neck: Negative for carotid bruits. No JVD Lungs:Clear to ausculation bilaterally. No wheezes, rales, or rhonchi. Breathing is unlabored. Cardiovascular: RRR with S1 S2. No murmurs Abdomen: Soft, non-tender, non-distended. No obvious abdominal masses. Extremities: No edema. Radial pulses 2+ bilaterally Neuro: Alert and oriented. No focal deficits. No facial asymmetry. MAE spontaneously. Psych: Responds to questions appropriately with normal affect.    Labs    Chemistry Recent Labs  Lab 01/17/20 2103 01/19/20 1225 01/20/20 0317  NA 140 137 137  K 3.5 3.5 3.6  CL 107 104 104  CO2 24 26 23   GLUCOSE 77 84 106*  BUN 15 9 11   CREATININE 1.21* 1.04* 1.13*  CALCIUM 8.8* 8.8* 8.5*  GFRNONAA 56* >60 >60  GFRAA >60 >60 >60   ANIONGAP 9 7 10      Hematology Recent Labs  Lab 01/17/20 2103 01/19/20 0243 01/20/20 0317  WBC 8.4 6.6 6.1  RBC 3.92 3.76* 3.86*  HGB 11.3* 11.1* 11.2*  HCT 35.9* 34.5* 34.9*  MCV 91.6 91.8 90.4  MCH 28.8 29.5 29.0  MCHC 31.5 32.2 32.1  RDW 14.2 13.9 13.7  PLT 214 194 195    Cardiac EnzymesNo results for input(s): TROPONINI in the last 168 hours. No results for input(s): TROPIPOC in the last 168 hours.   BNPNo results for input(s): BNP, PROBNP in the last 168 hours.   DDimer No results for input(s): DDIMER in the last 168 hours.   Radiology    CARDIAC CATHETERIZATION  Result Date: 01/18/2020 1.  Normal coronary arteries.  The coronary arteries are tortuous likely due to hypertension. 2.  Left ventricular angiography was not performed.  EF was normal by echo.  LVEDP was mildly elevated at 13 mmHg. Recommendations: No culprit is identified for the patient's chest pain and mildly elevated troponin.  Suspect supply demand ischemia due to elevated blood pressure.  Recommend blood pressure control.  ECHOCARDIOGRAM COMPLETE  Result Date: 01/18/2020    ECHOCARDIOGRAM REPORT   Patient Name:   Jeanette Gamble  [facility] Date of Exam: 01/18/2020 Medical Rec #:  [id number]        Height:       68.0 in Accession #:    [id number]       Weight:       286.6 lb Date of Birth:  03-10-1981        BSA:          2.381 m Patient Age:    39 years         BP:           156/95 mmHg Patient Gender: F                HR:           51 bpm. Exam Location:  Inpatient Procedure: 2D Echo, Cardiac Doppler and Color Doppler Indications:    Chest pain  History:        Patient has no prior history of Echocardiogram examinations.                 Risk Factors:Hypertension, Dyslipidemia, Current Smoker and                 Obesity. Elevated Troponin.  Sonographer:    Dustin Flock Referring Phys: [place]  1. Left ventricular ejection fraction, by estimation, is 60 to 65%. The left ventricle has normal  function. The left ventricle has no regional wall motion abnormalities. There is moderate left ventricular hypertrophy. Left ventricular diastolic parameters are indeterminate.  2. Right ventricular systolic function is normal. The right ventricular size is normal. There is normal pulmonary artery systolic pressure. The estimated right ventricular systolic pressure is 17.5 mmHg.  3. The mitral valve is normal in structure. No evidence of mitral valve regurgitation. No evidence of mitral stenosis.  4. The aortic valve is normal in structure. Aortic valve regurgitation is mild. No aortic stenosis is present. Aortic regurgitation PHT measures 671 msec.  5. Aortic dilatation noted. There is mild dilatation of the ascending aorta, measuring 40 mm.  6. The inferior vena cava is normal in size with greater than 50% respiratory variability, suggesting right atrial pressure of 3 mmHg. FINDINGS  Left Ventricle: Left ventricular ejection fraction, by estimation, is 60 to 65%. The left ventricle has normal function. The left ventricle has no regional wall motion abnormalities. The left ventricular internal cavity size was normal in size. There is  moderate left ventricular hypertrophy. Left ventricular diastolic parameters are indeterminate. Right Ventricle: The right ventricular size is normal. No increase in right ventricular wall thickness. Right ventricular systolic function is normal. There is normal pulmonary artery systolic pressure. The tricuspid regurgitant velocity is 2.38 m/s, and  with an assumed right atrial pressure of 3 mmHg, the estimated right ventricular systolic pressure is 10.2 mmHg. Left Atrium: Left atrial size was normal in size. Right Atrium: Right atrial size was normal in size. Pericardium: There is no evidence of pericardial effusion. Mitral Valve: The mitral valve is normal in structure. No evidence of mitral valve regurgitation. No evidence of mitral valve stenosis. Tricuspid Valve: The tricuspid  valve is normal in structure. Tricuspid valve regurgitation is trivial. No evidence of tricuspid stenosis. Aortic Valve: The aortic valve is normal in structure. Aortic valve regurgitation is mild. Aortic regurgitation PHT measures 671 msec. No aortic stenosis is present. Pulmonic Valve: The pulmonic valve was normal in structure. Pulmonic valve regurgitation is trivial. No evidence of pulmonic stenosis. Aorta: Aortic dilatation noted. There is mild dilatation  of the ascending aorta, measuring 40 mm. Venous: The inferior vena cava is normal in size with greater than 50% respiratory variability, suggesting right atrial pressure of 3 mmHg. IAS/Shunts: No atrial level shunt detected by color flow Doppler.  LEFT VENTRICLE PLAX 2D LVIDd:         4.50 cm  Diastology LVIDs:         3.00 cm  LV e' medial:    6.64 cm/s LV PW:         1.50 cm  LV E/e' medial:  16.3 LV IVS:        1.40 cm  LV e' lateral:   9.36 cm/s LVOT diam:     2.20 cm  LV E/e' lateral: 11.5 LV SV:         81 LV SV Index:   34 LVOT Area:     3.80 cm  RIGHT VENTRICLE RV Basal diam:  2.70 cm RV S prime:     12.10 cm/s TAPSE (M-mode): 4.0 cm LEFT ATRIUM           Index       RIGHT ATRIUM           Index LA diam:      3.70 cm 1.55 cm/m  RA Area:     16.70 cm LA Vol (A4C): 24.9 ml 10.46 ml/m RA Volume:   44.80 ml  18.81 ml/m  AORTIC VALVE LVOT Vmax:   94.20 cm/s LVOT Vmean:  67.800 cm/s LVOT VTI:    0.213 m AI PHT:      671 msec  AORTA Ao Root diam: 3.10 cm MITRAL VALVE                TRICUSPID VALVE MV Area (PHT): 2.13 cm     TR Peak grad:   22.7 mmHg MV Decel Time: 356 msec     TR Vmax:        238.00 cm/s MV E velocity: 108.00 cm/s MV A velocity: 84.50 cm/s   SHUNTS MV E/A ratio:  1.28         Systemic VTI:  0.21 m                             Systemic Diam: 2.20 cm Candee Furbish MD Electronically signed by Candee Furbish MD Signature Date/Time: 01/18/2020/11:36:00 AM    Final    Telemetry    01/20/20 NSR with HR in the 60-70's today - Personally  Reviewed  ECG    No new tracing as of 01/20/20- Personally Reviewed  Cardiac Studies   LHC 01/18/20:  . Normal coronary arteries. The coronary arteries are tortuous likely due to hypertension. 2. Left ventricular angiography was not performed. EF was normal by echo. LVEDP was mildly elevated at 13 mmHg.  Recommendations: No culprit is identified for the patient's chest pain and mildly elevated troponin. Suspect supply demand ischemia due to elevated blood pressure. Recommend blood pressure control  Echo 01/18/20:  1. Left ventricular ejection fraction, by estimation, is 60 to 65%. The  left ventricle has normal function. The left ventricle has no regional  wall motion abnormalities. There is moderate left ventricular hypertrophy.  Left ventricular diastolic  parameters are indeterminate.  2. Right ventricular systolic function is normal. The right ventricular  size is normal. There is normal pulmonary artery systolic pressure. The  estimated right ventricular systolic pressure is 02.7 mmHg.  3. The mitral valve is normal in  structure. No evidence of mitral valve  regurgitation. No evidence of mitral stenosis.  4. The aortic valve is normal in structure. Aortic valve regurgitation is  mild. No aortic stenosis is present. Aortic regurgitation PHT measures 671  msec.  5. Aortic dilatation noted. There is mild dilatation of the ascending  aorta, measuring 40 mm.  6. The inferior vena cava is normal in size with greater than 50%  respiratory variability, suggesting right atrial pressure of 3 mmHg.   Patient Profile     39 y.o. female with a hx of HTN, HL, tobacco use, preeclampsia who presented with chest pain and found to have elevated troponin.  Assessment & Plan    1. Chest pain: -LHC performed 01/18/20 with normal coronary arteries>>suspect supply demand ischemia due to elevated blood pressure with recommendations for greater blood pressure  control -Echocardiogram with normal LV function, moderate LVH, mild AR and mild dilation of the ascending aorta measuring 42mm -No recurrence today  -Continue ASA for CV risk factor modification   2. HTN: -Elevated, 151/103>159/93>148/91 -Currently on amlodipine 10, hydralazine 50, lisinopril 40 -Will add HCTZ 12.5 today  -May need close follow up in the HTN clinic after discharge for further med titration   3. HLD: -LDL, 61 -Continue atorvastatin 40mg  PO QD   4. 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  Agree with above as stated.  Right radial site stable.  2+ right radial pulse.  BP better controlled.  Continue with amlodipine 10 mg daily, lisinopril 40 mg daily, HCTZ 25 mg daily.  Low salt diet.  Walking, 30 minutes/day.  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 Out of work until Monday 10/11.  From 10/11-10/18, no lifting more than 20 lbs.   F/u with PharmD clinic for HTN with [facility] next week.  She will need assistance with transportation.   Larae Grooms  For questions or updates, please contact   Please consult [email or web address] for contact info under Cardiology/STEMI.

## 2020-01-20 NOTE — Discharge Summary (Addendum)
Discharge Summary    Patient ID: Jeanette Gamble MRN: [id number]; DOB: [birth year]  Admit date: 01/17/2020 Discharge date: 01/20/2020  Primary Care Provider: Harvie Heck, MD  Primary Cardiologist: Dr. Irish Lack, MD   Discharge Diagnoses    Active Problems:   Essential hypertension   Chest pain   Elevated troponin  Diagnostic Studies/Procedures    LHC 01/18/20:  . Normal coronary arteries. The coronary arteries are tortuous likely due to hypertension. 2. Left ventricular angiography was not performed. EF was normal by echo. LVEDP was mildly elevated at 13 mmHg.  Recommendations: No culprit is identified for the patient's chest pain and mildly elevated troponin. Suspect supply demand ischemia due to elevated blood pressure. Recommend blood pressure control  Echo 01/18/20:  1. Left ventricular ejection fraction, by estimation, is 60 to 65%. The  left ventricle has normal function. The left ventricle has no regional  wall motion abnormalities. There is moderate left ventricular hypertrophy.  Left ventricular diastolic  parameters are indeterminate.  2. Right ventricular systolic function is normal. The right ventricular  size is normal. There is normal pulmonary artery systolic pressure. The  estimated right ventricular systolic pressure is 33.3 mmHg.  3. The mitral valve is normal in structure. No evidence of mitral valve  regurgitation. No evidence of mitral stenosis.  4. The aortic valve is normal in structure. Aortic valve regurgitation is  mild. No aortic stenosis is present. Aortic regurgitation PHT measures 671  msec.  5. Aortic dilatation noted. There is mild dilatation of the ascending  aorta, measuring 40 mm.  6. The inferior vena cava is normal in size with greater than 50%  respiratory variability, suggesting right atrial pressure of 3 mmHg.   History of Present Illness     Jeanette Gamble is a 39 y.o. female with a hx ofHTN, HL, tobacco use,  and preeclampsia who presented with chest pain and found to have elevated troponin.  Jeanette Gamble is a 39 yo female with PMH noted above. She had never seen cardiology in that past with no known family hx of CAD. Current smoker, reports taking blood pressure medications at home but has struggled with good control. Works as a Scientist, water quality. One day prior to presentation while at work she developed left sided chest pressure with some shortness of breath and headache. Symptoms lingered for several hours. A co-worker recommended that she go to the ED. She eventually called EMS and was given 324 ASA and nitro without significant relief.   In the ED her labs showed stable electrolytes, Cr 1.21, troponin 38>>104, WBC 8.4, Hgb 11.3. EKG showed SR 68bpm with nonspecific changes. CXR negative. At the time of assessment in the ED she had recently been given morphine but reported 8/10 pain still, along with headache. She has been started on IV heparin. hst continued to rise therefore she was taken to the cath lab fir further evaluation.   Hospital Course   LHC performed 01/18/20 with normal coronary arteries>>suspect supply demand ischemia due to elevated blood pressurewith recommendations for greaterblood pressure control. Echocardiogram with normal LV function, moderate LVH, mild AR and mild dilation of the ascending aorta measuring 50mm. She was started on ASA for CV risk factor modification.   She continued to have persistently high BP's therefore medications were adjusted. Currently onamlodipine 10, hydralazine 50, lisinopril 40 and HCTZ was added on day of discharge at 25mg  PO QD with plans for HTN clinic follow up with BM[facility].   Other hospital concerns include:  HLD: -LDL,  61 -Continue atorvastatin 40mg  PO QD   Tobacco use: -Cessation strongly encouraged   Ascending aorta dilation: -Per echo measuring 30mm -Attempt better BP control -Serial imaging  Consultants: None   The patient was seen  and examined by Dr. Irish Lack who feels that she is stable and ready for discharge today, 01/20/20. She will have follow up with out HTN clinic with labs, already scheduled.    Did the patient have an acute coronary syndrome (MI, NSTEMI, STEMI, etc) this admission?:  No                               Did the patient have a percutaneous coronary intervention (stent / angioplasty)?:  No.   _____________  Discharge Vitals Blood pressure (!) 162/104, pulse 68, temperature 97.9 F (36.6 C), temperature source Oral, resp. rate 16, height 5\' 8"  (1.727 m), weight 130 kg, last menstrual period 01/12/2020, SpO2 96 %.  Filed Weights   01/17/20 2058  Weight: 130 kg    Labs & Radiologic Studies    CBC Recent Labs    01/19/20 0243 01/20/20 0317  WBC 6.6 6.1  HGB 11.1* 11.2*  HCT 34.5* 34.9*  MCV 91.8 90.4  PLT 194 010   Basic Metabolic Panel Recent Labs    01/19/20 1225 01/20/20 0317  NA 137 137  K 3.5 3.6  CL 104 104  CO2 26 23  GLUCOSE 84 106*  BUN 9 11  CREATININE 1.04* 1.13*  CALCIUM 8.8* 8.5*   Liver Function Tests No results for input(s): AST, ALT, ALKPHOS, BILITOT, PROT, ALBUMIN in the last 72 hours. No results for input(s): LIPASE, AMYLASE in the last 72 hours. High Sensitivity Troponin:   Recent Labs  Lab 01/17/20 2103 01/18/20 0312 01/18/20 0633 01/18/20 0854  TROPONINIHS 38* 104* 147* 180*    BNP Invalid input(s): POCBNP D-Dimer No results for input(s): DDIMER in the last 72 hours. Hemoglobin A1C Recent Labs    01/18/20 0854  HGBA1C 5.4   Fasting Lipid Panel Recent Labs    01/19/20 0243  CHOL 120  HDL 48  LDLCALC 61  TRIG 53  CHOLHDL 2.5   Thyroid Function Tests No results for input(s): TSH, T4TOTAL, T3FREE, THYROIDAB in the last 72 hours.  Invalid input(s): FREET3 _____________  DG Chest 2 View  Result Date: 01/17/2020 CLINICAL DATA:  Chest pain radiating to the left side of the chest and left arm. EXAM: CHEST - 2 VIEW COMPARISON:  None.  FINDINGS: The cardiac silhouette is mildly enlarged and unchanged in size. Both lungs are clear. Moderate severity dextroscoliosis of the thoracic spine is seen. IMPRESSION: No active cardiopulmonary disease. Electronically Signed   By: Virgina Norfolk M.D.   On: 01/17/2020 22:22   CARDIAC CATHETERIZATION  Result Date: 01/18/2020 1.  Normal coronary arteries.  The coronary arteries are tortuous likely due to hypertension. 2.  Left ventricular angiography was not performed.  EF was normal by echo.  LVEDP was mildly elevated at 13 mmHg. Recommendations: No culprit is identified for the patient's chest pain and mildly elevated troponin.  Suspect supply demand ischemia due to elevated blood pressure.  Recommend blood pressure control.  ECHOCARDIOGRAM COMPLETE  Result Date: 01/18/2020    ECHOCARDIOGRAM REPORT   Patient Name:   Jeanette Gamble Date of Exam: 01/18/2020 Medical Rec #:  [id number]        Height:       68.0 in Accession #:  [id number]       Weight:       286.6 lb Date of Birth:  [birth year]        BSA:          2.381 m Patient Age:    39 years         BP:           156/95 mmHg Patient Gender: F                HR:           51 bpm. Exam Location:  Inpatient Procedure: 2D Echo, Cardiac Doppler and Color Doppler Indications:    Chest pain  History:        Patient has no prior history of Echocardiogram examinations.                 Risk Factors:Hypertension, Dyslipidemia, Current Smoker and                 Obesity. Elevated Troponin.  Sonographer:    Dustin Flock Referring Phys: [place]  1. Left ventricular ejection fraction, by estimation, is 60 to 65%. The left ventricle has normal function. The left ventricle has no regional wall motion abnormalities. There is moderate left ventricular hypertrophy. Left ventricular diastolic parameters are indeterminate.  2. Right ventricular systolic function is normal. The right ventricular size is normal. There is normal pulmonary  artery systolic pressure. The estimated right ventricular systolic pressure is 51.7 mmHg.  3. The mitral valve is normal in structure. No evidence of mitral valve regurgitation. No evidence of mitral stenosis.  4. The aortic valve is normal in structure. Aortic valve regurgitation is mild. No aortic stenosis is present. Aortic regurgitation PHT measures 671 msec.  5. Aortic dilatation noted. There is mild dilatation of the ascending aorta, measuring 40 mm.  6. The inferior vena cava is normal in size with greater than 50% respiratory variability, suggesting right atrial pressure of 3 mmHg. FINDINGS  Left Ventricle: Left ventricular ejection fraction, by estimation, is 60 to 65%. The left ventricle has normal function. The left ventricle has no regional wall motion abnormalities. The left ventricular internal cavity size was normal in size. There is  moderate left ventricular hypertrophy. Left ventricular diastolic parameters are indeterminate. Right Ventricle: The right ventricular size is normal. No increase in right ventricular wall thickness. Right ventricular systolic function is normal. There is normal pulmonary artery systolic pressure. The tricuspid regurgitant velocity is 2.38 m/s, and  with an assumed right atrial pressure of 3 mmHg, the estimated right ventricular systolic pressure is 61.6 mmHg. Left Atrium: Left atrial size was normal in size. Right Atrium: Right atrial size was normal in size. Pericardium: There is no evidence of pericardial effusion. Mitral Valve: The mitral valve is normal in structure. No evidence of mitral valve regurgitation. No evidence of mitral valve stenosis. Tricuspid Valve: The tricuspid valve is normal in structure. Tricuspid valve regurgitation is trivial. No evidence of tricuspid stenosis. Aortic Valve: The aortic valve is normal in structure. Aortic valve regurgitation is mild. Aortic regurgitation PHT measures 671 msec. No aortic stenosis is present. Pulmonic Valve: The  pulmonic valve was normal in structure. Pulmonic valve regurgitation is trivial. No evidence of pulmonic stenosis. Aorta: Aortic dilatation noted. There is mild dilatation of the ascending aorta, measuring 40 mm. Venous: The inferior vena cava is normal in size with greater than 50% respiratory variability, suggesting right atrial pressure of 3 mmHg. IAS/Shunts: No  atrial level shunt detected by color flow Doppler.  LEFT VENTRICLE PLAX 2D LVIDd:         4.50 cm  Diastology LVIDs:         3.00 cm  LV e' medial:    6.64 cm/s LV PW:         1.50 cm  LV E/e' medial:  16.3 LV IVS:        1.40 cm  LV e' lateral:   9.36 cm/s LVOT diam:     2.20 cm  LV E/e' lateral: 11.5 LV SV:         81 LV SV Index:   34 LVOT Area:     3.80 cm  RIGHT VENTRICLE RV Basal diam:  2.70 cm RV S prime:     12.10 cm/s TAPSE (M-mode): 4.0 cm LEFT ATRIUM           Index       RIGHT ATRIUM           Index LA diam:      3.70 cm 1.55 cm/m  RA Area:     16.70 cm LA Vol (A4C): 24.9 ml 10.46 ml/m RA Volume:   44.80 ml  18.81 ml/m  AORTIC VALVE LVOT Vmax:   94.20 cm/s LVOT Vmean:  67.800 cm/s LVOT VTI:    0.213 m AI PHT:      671 msec  AORTA Ao Root diam: 3.10 cm MITRAL VALVE                TRICUSPID VALVE MV Area (PHT): 2.13 cm     TR Peak grad:   22.7 mmHg MV Decel Time: 356 msec     TR Vmax:        238.00 cm/s MV E velocity: 108.00 cm/s MV A velocity: 84.50 cm/s   SHUNTS MV E/A ratio:  1.28         Systemic VTI:  0.21 m                             Systemic Diam: 2.20 cm Candee Furbish MD Electronically signed by Candee Furbish MD Signature Date/Time: 01/18/2020/11:36:00 AM    Final    Disposition   Pt is being discharged home today in good condition.  Follow-up Plans & Appointments    Follow-up Information     [facility] Follow up on 01/26/2020.   Why: with Hypertension Clinic follow up at 1030am  Contact information: [place]  [id number] [phone]             Discharge Instructions    Call MD for:  difficulty breathing, headache or visual disturbances   Complete by: As directed    Call MD for:  extreme fatigue   Complete by: As directed    Call MD for:  hives   Complete by: As directed    Call MD for:  persistant dizziness or light-headedness   Complete by: As directed    Call MD for:  persistant nausea and vomiting   Complete by: As directed    Call MD for:  redness, tenderness, or signs of infection (pain, swelling, redness, odor or green/yellow discharge around incision site)   Complete by: As directed    Call MD for:  severe uncontrolled pain   Complete by: As directed    Call MD for:  temperature >100.4   Complete by: As  directed    Diet - low sodium heart healthy   Complete by: As directed    Discharge instructions   Complete by: As directed    No lifting over 5 lbs for 1 week. No sexual activity for 1 week. You may return to work on 01/27/20. Keep procedure site clean & dry. If you notice increased pain, swelling, bleeding or pus, call/return!  You may shower, but no soaking baths/hot tubs/pools for 1 week.   Increase activity slowly   Complete by: As directed      Discharge Medications   Allergies as of 01/20/2020   No Known Allergies     Medication List    STOP taking these medications   amLODipine-benazepril 10-20 MG capsule Commonly known as: Lotrel     TAKE these medications   allopurinol 100 MG tablet Commonly known as: ZYLOPRIM Take 2 tablets (200 mg total) by mouth daily.   amLODipine 10 MG tablet Commonly known as: NORVASC Take 1 tablet (10 mg total) by mouth daily. Start taking on: January 21, 2020   aspirin 81 MG EC tablet Take 1 tablet (81 mg total) by mouth daily. Swallow whole. Start taking on: January 21, 2020   atorvastatin 40 MG tablet Commonly known as: LIPITOR TAKE 1 TABLET (40 MG TOTAL) BY MOUTH DAILY.   Chantix Starting Month Pak 0.5 MG X 11 & 1  MG X 42 tablet Generic drug: varenicline Take one 0.5 mg tablet by mouth once daily for 3 days, then increase to one 0.5 mg tablet twice daily for 4 days, then increase to one 1 mg tablet twice daily.   colchicine 0.6 MG tablet Take 1 tablet (0.6 mg total) by mouth daily.   diclofenac Sodium 1 % Gel Commonly known as: VOLTAREN APPLY 2 GRAMS TOPICALLY 4 (FOUR) TIMES DAILY. What changed: See the new instructions.   ferrous sulfate 325 (65 FE) MG tablet Take 1 tablet (325 mg total) by mouth 2 (two) times daily with a meal.   hydrALAZINE 50 MG tablet Commonly known as: APRESOLINE Take 1 tablet (50 mg total) by mouth every 8 (eight) hours.   hydrochlorothiazide 25 MG tablet Commonly known as: HYDRODIURIL Take 1 tablet (25 mg total) by mouth daily. Start taking on: January 21, 2020   lisinopril 40 MG tablet Commonly known as: ZESTRIL Take 1 tablet (40 mg total) by mouth daily. Start taking on: January 21, 2020   omeprazole 20 MG capsule Commonly known as: PRILOSEC Take 1 capsule (20 mg total) by mouth daily.   tiZANidine 4 MG tablet Commonly known as: ZANAFLEX TAKE 1 TABLET (4 MG TOTAL) BY MOUTH EVERY 8 (EIGHT) HOURS AS NEEDED FOR MUSCLE SPASMS.       Outstanding Labs/Studies   BMET at follow up with HTN Clinic   Duration of Discharge Encounter   Greater than 30 minutes including physician time.  Signed, Kathyrn Drown, NP 01/20/2020, 3:07 PM   I have examined the patient and reviewed assessment and plan and discussed with patient.  Agree with above as stated.  Right radial site stable.  2+ right radial pulse.  Hypertensive urgency.  BP better controlled.  Continue with amlodipine 10 mg daily, lisinopril 40 mg daily, HCTZ 25 mg daily.  Low salt diet.  Walking, 30 minutes/day.  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 Out of work until Monday 10/11.  From 10/11-10/18, no lifting more than 20 lbs.  F/u  with Pharm[facility] clinic for HTN with [facility] next week.  She will need assistance with transportation.   Larae Grooms

## 2020-01-20 NOTE — Progress Notes (Signed)
Pt discharged home. IV removed. Telemetry box removed. Pt received discharge instructions and all questions were answered. Pt instructed on the importance of taking her meds as scheduled and going to her follow-up appointments. Pt verbalized understanding. Pt received cab voucher for transportation home. Pt left via wheelchair and was accompanied by pt's nurse tech.

## 2020-01-23 ENCOUNTER — Encounter: Payer: Medicaid Other | Admitting: Internal Medicine

## 2020-01-26 ENCOUNTER — Other Ambulatory Visit: Payer: Medicaid Other

## 2020-01-26 ENCOUNTER — Ambulatory Visit: Payer: Medicaid Other

## 2020-01-31 ENCOUNTER — Encounter: Payer: Medicaid Other | Admitting: Internal Medicine
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
## 2020-02-09 ENCOUNTER — Ambulatory Visit: Payer: Medicaid Other

## 2020-02-09 ENCOUNTER — Telehealth: Payer: Self-pay

## 2020-02-13 ENCOUNTER — Other Ambulatory Visit: Payer: Self-pay | Admitting: Internal Medicine

## 2020-02-14 ENCOUNTER — Encounter: Payer: Self-pay | Admitting: General Practice

## 2020-03-29 ENCOUNTER — Encounter: Payer: Medicaid Other | Admitting: Internal Medicine

## 2020-03-30 ENCOUNTER — Telehealth: Payer: Self-pay

## 2020-03-30 ENCOUNTER — Encounter: Payer: Medicaid Other | Admitting: Internal Medicine

## 2020-04-09 IMAGING — DX DG SHOULDER 2+V*L*
3 series · 3 of 3 positions shown · non-contrast
Comparison: None.

CLINICAL DATA: Chronic shoulder pain

EXAM:
LEFT SHOULDER - 2+ VIEW

[w shoulder external left]
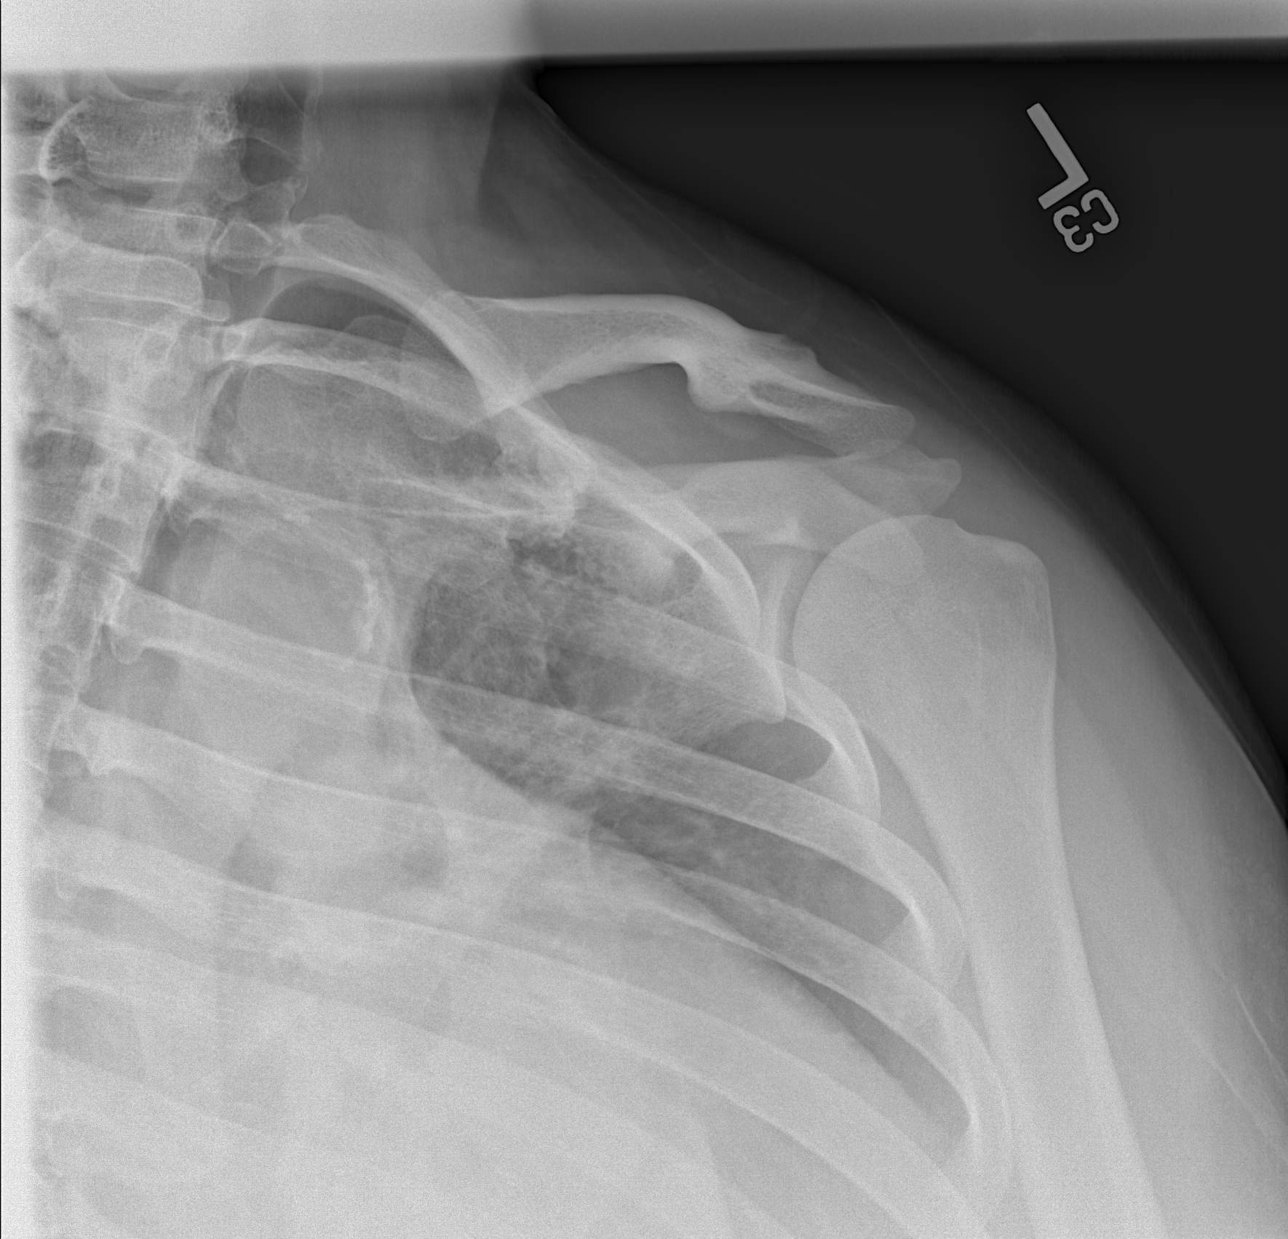

[x shoulder axillary left]
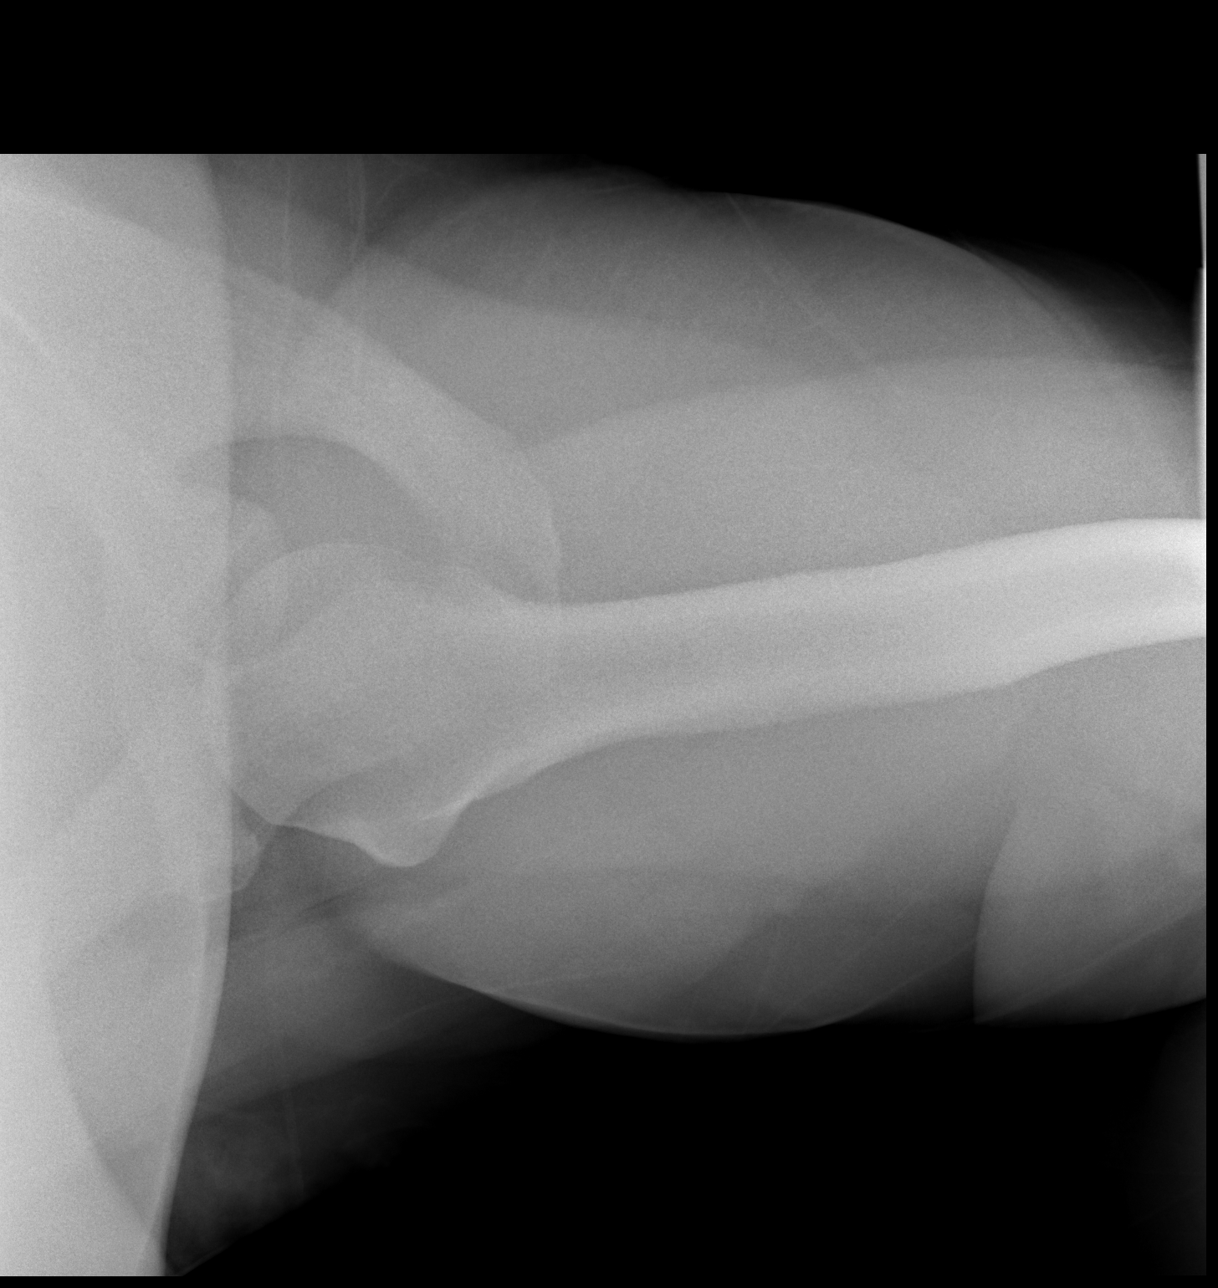

[w shoulder y-view left]
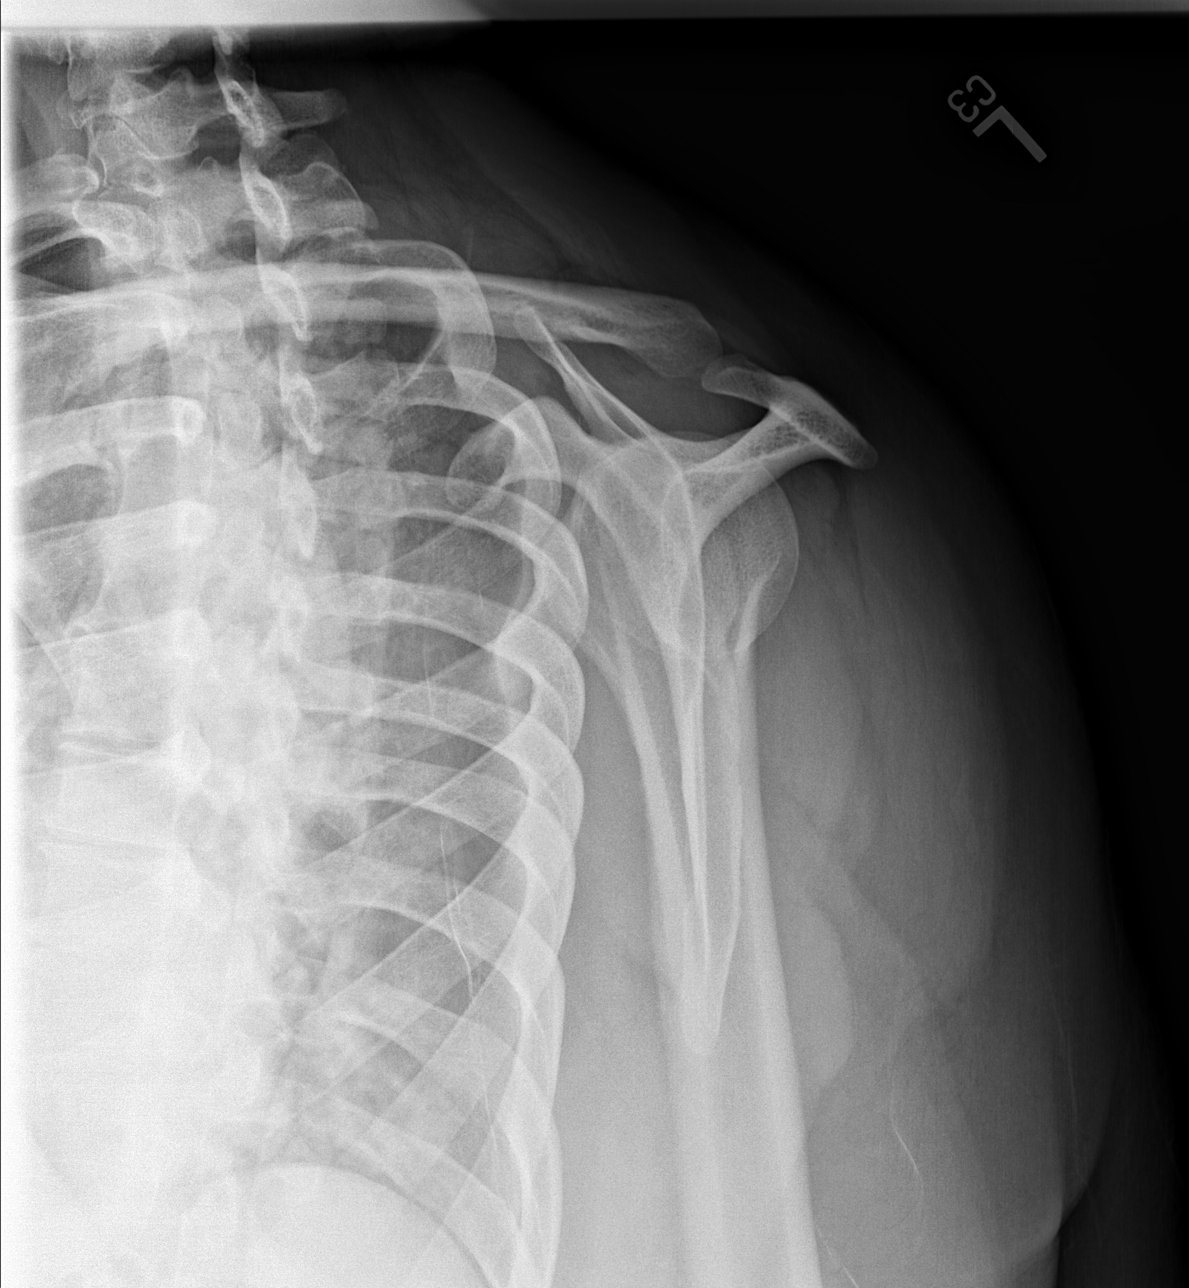

[3 of 3 positions shown; findings below may reference images not displayed]

FINDINGS: There is no evidence of fracture or dislocation. There is no
evidence of arthropathy or other focal bone abnormality. Soft
tissues are unremarkable.
IMPRESSION: Negative.

## 2020-04-11 ENCOUNTER — Encounter: Payer: Medicaid Other | Admitting: Internal Medicine

## 2020-05-17 ENCOUNTER — Other Ambulatory Visit: Payer: Self-pay | Admitting: Internal Medicine

## 2020-05-17 DIAGNOSIS — M79602 Pain in left arm: Secondary | ICD-10-CM

## 2020-05-17 DIAGNOSIS — G8929 Other chronic pain: Secondary | ICD-10-CM

## 2020-05-17 DIAGNOSIS — M545 Low back pain, unspecified: Secondary | ICD-10-CM

## 2020-05-19 ENCOUNTER — Ambulatory Visit (HOSPITAL_COMMUNITY)
Admission: EM | Admit: 2020-05-19 | Discharge: 2020-05-19 | Disposition: A | Discharge status: Home / Self Care | Payer: Medicaid Other

## 2020-05-19 ENCOUNTER — Encounter (HOSPITAL_COMMUNITY): Payer: Self-pay

## 2020-05-19 ENCOUNTER — Other Ambulatory Visit: Payer: Self-pay

## 2020-05-19 DIAGNOSIS — I10 Essential (primary) hypertension: Secondary | ICD-10-CM

## 2020-05-19 DIAGNOSIS — G8929 Other chronic pain: Secondary | ICD-10-CM

## 2020-05-19 DIAGNOSIS — M25512 Pain in left shoulder: Secondary | ICD-10-CM

## 2020-05-19 DIAGNOSIS — M25511 Pain in right shoulder: Secondary | ICD-10-CM | POA: Diagnosis not present

## 2020-05-19 NOTE — Discharge Instructions (Addendum)
-  For your shoulder pain, please follow-up with your PCP and neurosurgeon for improved control of your symptoms. I've provided a work note for today. Continue the gabapentin, zanaflex. Follow-up with PCP for refill of Tylenol 3.  -For your blood pressure, make sure to take all blood pressure medications as directed. Head straight to ED if worst headache of life, left-sided chest pain, etc.
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
## 2020-05-31 ENCOUNTER — Encounter: Payer: Self-pay | Admitting: Internal Medicine

## 2020-05-31 ENCOUNTER — Ambulatory Visit (INDEPENDENT_AMBULATORY_CARE_PROVIDER_SITE_OTHER): Payer: Medicaid Other | Admitting: Internal Medicine

## 2020-05-31 ENCOUNTER — Other Ambulatory Visit: Payer: Self-pay

## 2020-05-31 VITALS — BP 160/102 | HR 75 | Temp 98.1°F | Ht 68.0 in | Wt 249.4 lb

## 2020-05-31 DIAGNOSIS — S46811A Strain of other muscles, fascia and tendons at shoulder and upper arm level, right arm, initial encounter: Secondary | ICD-10-CM

## 2020-05-31 DIAGNOSIS — M62838 Other muscle spasm: Secondary | ICD-10-CM | POA: Diagnosis not present

## 2020-05-31 DIAGNOSIS — G8929 Other chronic pain: Secondary | ICD-10-CM

## 2020-05-31 DIAGNOSIS — M545 Low back pain, unspecified: Secondary | ICD-10-CM | POA: Diagnosis not present

## 2020-05-31 MED ORDER — CYCLOBENZAPRINE HCL 5 MG PO TABS: 5.0000 mg | ORAL_TABLET | Freq: Three times a day (TID) | ORAL | 0 refills | Status: DC | PRN

## 2020-05-31 NOTE — Patient Instructions (Signed)
It was nice seeing you today! Thank you for choosing [facility] for your Primary Care.    Today we talked about:   1. Severe muscle spasms:  a. Make sure to apply heat to your right shoulder to help relax that area. b. I have switched your Tizanidine to Flexeril to see if that will offer better relief. Make sure you do not take them together! Take 5 mg three times a day as needed c. I have referred you to Occupation Therapy for advanced treatments as well!

## 2020-06-01 DIAGNOSIS — M62838 Other muscle spasm: Secondary | ICD-10-CM | POA: Insufficient documentation

## 2020-06-01 NOTE — Assessment & Plan Note (Signed)
Patient endorses chronic back pain that is made it difficult to work.  She is requesting a note from a physician that states she should be released from working indefinitely.  Assessment/plan: I explained to Jeanette Gamble that I cannot write her that work note and she should speak to her PCP.
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
## 2020-06-01 NOTE — Assessment & Plan Note (Addendum)
Jeanette Gamble presents today for evaluation of bilateral shoulder pain that she states is chronic, but in the past 3 months, her right shoulder has been hurting her more.  She notes that this pain started around the same time she underwent cardiac catheterization and was wondering if the cath procedure may have triggered this.  She states that she is also been feeling weaker in the right upper extremity and does not know if that is due to the procedure or from her pain.  She notes pain with all range of motion and due to this, her range of motion has been limited.  She denies any trauma or falls.  She feels tizanidine is not helping with her symptoms.  Assessment/plan: On examination, patient has pain with both active and passive motion, suspicious for frozen shoulder especially in light of her limiting the shoulders movement for the past 3 months.  No clear signs of rotator cuff tear although the exam is significantly limited by patient's pain. Of note though, patient would yell out in pain even when she was not being touched though.  The most significant finding on exam is her profound muscle spasm of the right trapezius that is extending into the right shoulder and right neck.  I suspect this is the most likely cause of her current acute pain and will treat accordingly.  -Discontinue tizanidine -Start Flexeril 5 mg 3 times daily, #30 tablets -OT referral placed -Heat and massage recommended -Follow-up as needed

## 2020-06-03 NOTE — Progress Notes (Signed)
Internal Medicine Clinic Attending  I saw and evaluated the patient.  I personally confirmed the key portions of the history and exam documented by Dr. Charleen Kirks and I reviewed pertinent patient test results.  The assessment, diagnosis, and plan were formulated together and I agree with the documentation in the resident's note.  Jeanette Gamble was tender in many areas frequently affected in fibromyalgia.  Her symptoms should improve as the muscle tightness is relieved, and it will be important to maintain shoulder mobility through OT who may also employ some therapy modalities.

## 2020-06-13 ENCOUNTER — Other Ambulatory Visit: Payer: Self-pay | Admitting: Internal Medicine

## 2020-06-22 ENCOUNTER — Encounter: Payer: Self-pay | Admitting: *Deleted

## 2020-07-18 NOTE — Addendum Note (Signed)
Addended by: Hulan Fray on: 07/18/2020 04:46 PM   Modules accepted: Orders

## 2020-08-13 ENCOUNTER — Other Ambulatory Visit: Payer: Self-pay | Admitting: Cardiology

## 2020-08-20 ENCOUNTER — Emergency Department (HOSPITAL_COMMUNITY)
Admission: EM | Admit: 2020-08-20 | Discharge: 2020-08-20 | Disposition: A | Discharge status: Home / Self Care | Payer: Medicaid Other | Attending: Emergency Medicine | Admitting: Emergency Medicine

## 2020-08-20 ENCOUNTER — Other Ambulatory Visit: Payer: Self-pay

## 2020-08-20 ENCOUNTER — Encounter (HOSPITAL_COMMUNITY): Payer: Self-pay | Admitting: Pharmacy Technician

## 2020-08-20 DIAGNOSIS — H60502 Unspecified acute noninfective otitis externa, left ear: Secondary | ICD-10-CM

## 2020-08-20 DIAGNOSIS — Z955 Presence of coronary angioplasty implant and graft: Secondary | ICD-10-CM | POA: Diagnosis not present

## 2020-08-20 DIAGNOSIS — I129 Hypertensive chronic kidney disease with stage 1 through stage 4 chronic kidney disease, or unspecified chronic kidney disease: Secondary | ICD-10-CM | POA: Diagnosis not present

## 2020-08-20 DIAGNOSIS — Z79899 Other long term (current) drug therapy: Secondary | ICD-10-CM | POA: Diagnosis not present

## 2020-08-20 DIAGNOSIS — F17210 Nicotine dependence, cigarettes, uncomplicated: Secondary | ICD-10-CM | POA: Diagnosis not present

## 2020-08-20 DIAGNOSIS — Z7982 Long term (current) use of aspirin: Secondary | ICD-10-CM | POA: Insufficient documentation

## 2020-08-20 DIAGNOSIS — H9202 Otalgia, left ear: Secondary | ICD-10-CM | POA: Diagnosis present

## 2020-08-20 DIAGNOSIS — N1831 Chronic kidney disease, stage 3a: Secondary | ICD-10-CM | POA: Diagnosis not present

## 2020-08-20 MED ORDER — NEOMYCIN-POLYMYXIN-HC 3.5-10000-1 OT SUSP: 4.0000 [drp] | Freq: Four times a day (QID) | OTIC | 0 refills | Status: DC

## 2020-08-20 NOTE — ED Triage Notes (Signed)
Pt here with reports of L sided ear ache. Reports using a qtip and feeling like she pushed something in further. Denies fever.

## 2020-08-20 NOTE — Discharge Instructions (Signed)
Contact a health care provider if: °You have a fever. °Your ear is still red, swollen, painful, or draining pus after 3 days. °Your redness, swelling, or pain gets worse. °You have a severe headache. °You have redness, swelling, pain, or tenderness in the area behind your ear. °

## 2020-08-20 NOTE — ED Provider Notes (Signed)
Emergency Medicine Provider Triage Evaluation Note  Jeanette Gamble 40 y.o. F  was evaluated in triage.  Pt complains of left ear ache x 1 week.  No fevers.  She reports using a Q-tip and feels like she put something back in.  Review of Systems  Positive: Ear pain Negative: Fevers  Physical Exam  BP 134/82   Pulse 70   Temp 98.2 F (36.8 C) (Oral)   Resp 18   Ht 5\' 4"  (1.626 m)   Wt 65.8 kg   SpO2 100%   BMI 24.89 kg/m  Gen:   Awake, no distress   HEENT:  Atraumatic.  Right TM within normal limits.  Right external auditory canal slightly erythematous.  No foreign bodies noted.  Small effusion noted to the left TM. Resp:  Normal effort  Neuro:  Speech clear   Medical Decision Making  Medically screening exam initiated at 3:55 AM.  Appropriate orders placed.  Jeanette Gamble was informed that the remainder of the evaluation will be completed by another provider, this initial triage assessment does not replace that evaluation, and the importance of remaining in the ED until their evaluation is complete.  Clinical Impression  Ear pain   Portions of this note were generated with Dragon dictation software. Dictation errors may occur despite best attempts at proofreading.     Volanda Napoleon, PA-C 08/20/20 1321    Lacretia Leigh, MD 08/21/20 0930

## 2020-08-20 NOTE — ED Notes (Signed)
Reviewed discharge instructions with patient. Follow-up care and medications reviewed. Patient  verbalized understanding. Patient A&Ox4, VSS, and ambulatory with steady gait upon discharge.  °
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
## 2020-08-20 NOTE — ED Triage Notes (Signed)
Patient arrives to the ED for an evaluation of left ear pain.  Patient states she felt like something was crawling in her ear and she stuck a q-tip in the ear.  Patient states that her ear has been bothering her and still feels like something is crawling in there.

## 2020-08-21 ENCOUNTER — Telehealth: Payer: Self-pay | Admitting: *Deleted

## 2020-08-21 NOTE — Telephone Encounter (Signed)
Transition Care Management Unsuccessful Follow-up Telephone Call  Date of discharge and from where:  08/20/2020 [facility]  Attempts:  1st Attempt  Reason for unsuccessful TCM follow-up call:  Left voice message

## 2020-08-22 ENCOUNTER — Other Ambulatory Visit: Payer: Self-pay | Admitting: Internal Medicine

## 2020-08-22 DIAGNOSIS — K219 Gastro-esophageal reflux disease without esophagitis: Secondary | ICD-10-CM

## 2020-08-22 NOTE — Telephone Encounter (Signed)
Transition Care Management Follow-up Telephone Call  Date of discharge and from where: 08/20/2020 - [facility]  How have you been since you were released from the hospital? "Feeling better"  Any questions or concerns? No  Items Reviewed:  Did the pt receive and understand the discharge instructions provided? Yes   Medications obtained and verified? Yes   Other? No   Any new allergies since your discharge? No   Dietary orders reviewed? No  Do you have support at home? Yes    Functional Questionnaire: (I = Independent and D = Dependent) ADLs: I  Bathing/Dressing- I  Meal Prep- I  Eating- I  Maintaining continence- I  Transferring/Ambulation- I  Managing Meds- I  Follow up appointments reviewed:   PCP Hospital f/u appt confirmed? No    Specialist Hospital f/u appt confirmed? No    Are transportation arrangements needed? N/A  If their condition worsens, is the pt aware to call PCP or go to the Emergency Dept.? Yes  Was the patient provided with contact information for the PCP's office or ED? Yes  Was to pt encouraged to call back with questions or concerns? Yes

## 2020-08-25 ENCOUNTER — Encounter (HOSPITAL_COMMUNITY): Payer: Self-pay | Admitting: *Deleted

## 2020-08-25 ENCOUNTER — Other Ambulatory Visit: Payer: Self-pay

## 2020-08-25 ENCOUNTER — Ambulatory Visit (HOSPITAL_COMMUNITY)
Admission: EM | Admit: 2020-08-25 | Discharge: 2020-08-25 | Disposition: A | Discharge status: Home / Self Care | Payer: Medicaid Other | Attending: Emergency Medicine | Admitting: Emergency Medicine

## 2020-08-25 DIAGNOSIS — I10 Essential (primary) hypertension: Secondary | ICD-10-CM | POA: Diagnosis not present

## 2020-08-25 DIAGNOSIS — L03213 Periorbital cellulitis: Secondary | ICD-10-CM | POA: Diagnosis not present

## 2020-08-25 MED ORDER — AMOXICILLIN-POT CLAVULANATE 875-125 MG PO TABS: 1.0000 | ORAL_TABLET | Freq: Two times a day (BID) | ORAL | 0 refills | Status: DC

## 2020-08-25 NOTE — Discharge Instructions (Addendum)
Take the antibiotic as directed.  Go to the emergency department if you have acute eye pain, changes in your vision, or other concerning symptoms.    Your blood pressure is elevated today at 178/119.  Please have this rechecked by your primary care provider in 1-2 weeks.
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
## 2020-08-25 NOTE — ED Triage Notes (Signed)
RT eye Upper lid  swelling 2 days.

## 2020-09-14 ENCOUNTER — Other Ambulatory Visit: Payer: Self-pay | Admitting: Cardiology

## 2020-09-14 ENCOUNTER — Other Ambulatory Visit: Payer: Self-pay | Admitting: Student

## 2020-10-23 ENCOUNTER — Encounter: Payer: Self-pay | Admitting: *Deleted

## 2020-11-15 ENCOUNTER — Telehealth: Payer: Self-pay | Admitting: Internal Medicine

## 2020-11-15 NOTE — Telephone Encounter (Signed)
Called both numbers for pt both ring than busy called to inform the pt that provider will be virtual and will call them on the number listed for the appt no need to come into the office

## 2020-11-19 ENCOUNTER — Other Ambulatory Visit: Payer: Self-pay

## 2020-11-19 ENCOUNTER — Ambulatory Visit: Payer: Medicaid Other | Attending: Internal Medicine | Admitting: Internal Medicine

## 2020-11-19 DIAGNOSIS — Z91199 Patient's noncompliance with other medical treatment and regimen due to unspecified reason: Secondary | ICD-10-CM

## 2020-11-19 DIAGNOSIS — Z5329 Procedure and treatment not carried out because of patient's decision for other reasons: Secondary | ICD-10-CM

## 2020-11-19 NOTE — Progress Notes (Signed)
Pt on schedule for telphone/Video visit.  PC placed to pt at 3:49 on home phone then on mobile.  LVMM informing her to call us back to reschedule.

## 2020-12-25 ENCOUNTER — Other Ambulatory Visit: Payer: Self-pay | Admitting: Cardiology

## 2021-01-01 ENCOUNTER — Other Ambulatory Visit: Payer: Self-pay | Admitting: Student

## 2021-01-01 ENCOUNTER — Other Ambulatory Visit: Payer: Self-pay | Admitting: Cardiology

## 2021-02-03 ENCOUNTER — Other Ambulatory Visit: Payer: Self-pay

## 2021-02-03 ENCOUNTER — Encounter (HOSPITAL_COMMUNITY): Payer: Self-pay | Admitting: *Deleted

## 2021-02-03 ENCOUNTER — Emergency Department (HOSPITAL_COMMUNITY)
Admission: EM | Admit: 2021-02-03 | Discharge: 2021-02-03 | Disposition: A | Discharge status: Home / Self Care | Payer: Medicaid Other | Attending: Emergency Medicine | Admitting: Emergency Medicine

## 2021-02-03 DIAGNOSIS — Z7982 Long term (current) use of aspirin: Secondary | ICD-10-CM | POA: Diagnosis not present

## 2021-02-03 DIAGNOSIS — I129 Hypertensive chronic kidney disease with stage 1 through stage 4 chronic kidney disease, or unspecified chronic kidney disease: Secondary | ICD-10-CM | POA: Insufficient documentation

## 2021-02-03 DIAGNOSIS — Z79899 Other long term (current) drug therapy: Secondary | ICD-10-CM | POA: Insufficient documentation

## 2021-02-03 DIAGNOSIS — N1831 Chronic kidney disease, stage 3a: Secondary | ICD-10-CM | POA: Diagnosis not present

## 2021-02-03 DIAGNOSIS — F17210 Nicotine dependence, cigarettes, uncomplicated: Secondary | ICD-10-CM | POA: Diagnosis not present

## 2021-02-03 DIAGNOSIS — H6692 Otitis media, unspecified, left ear: Secondary | ICD-10-CM | POA: Diagnosis not present

## 2021-02-03 DIAGNOSIS — H9202 Otalgia, left ear: Secondary | ICD-10-CM | POA: Diagnosis not present

## 2021-02-03 MED ORDER — AMOXICILLIN 500 MG PO CAPS: 500.0000 mg | ORAL_CAPSULE | Freq: Three times a day (TID) | ORAL | 0 refills | Status: DC
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
## 2021-02-03 NOTE — ED Triage Notes (Signed)
PT was dx with Swimmer's Ear, but pt does not feel like that is it.  She feels like something is in her ear.  Pain is increasing.

## 2021-02-03 NOTE — Discharge Instructions (Signed)
Take the prescribed medication as directed. Follow-up with Dr. Benjamine Mola if ongoing issues.  Call his office if appt needed. Return to the ED for new or worsening symptoms.

## 2021-02-04 ENCOUNTER — Telehealth: Payer: Self-pay

## 2021-02-04 NOTE — Telephone Encounter (Signed)
Transition Care Management Unsuccessful Follow-up Telephone Call  Date of discharge and from where:  02/03/2021 from [facility]  Attempts:  1st Attempt  Reason for unsuccessful TCM follow-up call:  Unable to leave message

## 2021-02-07 NOTE — Telephone Encounter (Signed)
Transition Care Management Unsuccessful Follow-up Telephone Call  Date of discharge and from where:  02/03/2021 from [facility]  Attempts:  2nd Attempt  Reason for unsuccessful TCM follow-up call:  Unable to leave message

## 2021-02-08 NOTE — Telephone Encounter (Signed)
Transition Care Management Unsuccessful Follow-up Telephone Call  Date of discharge and from where:  02/03/2021 from [facility]  Attempts:  3rd Attempt  Reason for unsuccessful TCM follow-up call:  Unable to reach patient

## 2021-03-07 ENCOUNTER — Encounter: Payer: Medicaid Other | Admitting: Internal Medicine

## 2021-04-19 ENCOUNTER — Other Ambulatory Visit: Payer: Self-pay | Admitting: Internal Medicine

## 2021-04-19 DIAGNOSIS — M545 Low back pain, unspecified: Secondary | ICD-10-CM

## 2021-04-19 DIAGNOSIS — M79602 Pain in left arm: Secondary | ICD-10-CM

## 2021-04-19 DIAGNOSIS — G8929 Other chronic pain: Secondary | ICD-10-CM

## 2021-04-21 DIAGNOSIS — D500 Iron deficiency anemia secondary to blood loss (chronic): Secondary | ICD-10-CM

## 2021-04-21 HISTORY — DX: Iron deficiency anemia secondary to blood loss (chronic): D50.0

## 2021-04-30 ENCOUNTER — Other Ambulatory Visit: Payer: Self-pay | Admitting: *Deleted

## 2021-04-30 DIAGNOSIS — K219 Gastro-esophageal reflux disease without esophagitis: Secondary | ICD-10-CM

## 2021-04-30 MED ORDER — OMEPRAZOLE 20 MG PO CPDR: 20.0000 mg | DELAYED_RELEASE_CAPSULE | Freq: Every day | ORAL | 3 refills | Status: DC

## 2021-04-30 MED ORDER — LISINOPRIL 40 MG PO TABS: 40.0000 mg | ORAL_TABLET | Freq: Every day | ORAL | 3 refills | Status: DC

## 2021-04-30 MED ORDER — HYDROCHLOROTHIAZIDE 25 MG PO TABS: 25.0000 mg | ORAL_TABLET | Freq: Every day | ORAL | 3 refills | Status: DC

## 2021-04-30 MED ORDER — HYDRALAZINE HCL 50 MG PO TABS: 50.0000 mg | ORAL_TABLET | Freq: Three times a day (TID) | ORAL | 2 refills | Status: DC

## 2021-04-30 MED ORDER — AMLODIPINE BESYLATE 10 MG PO TABS: 10.0000 mg | ORAL_TABLET | Freq: Every day | ORAL | 3 refills | Status: DC

## 2021-04-30 NOTE — Telephone Encounter (Signed)
Patient requesting refill on blood pressure and acid reflux medications.  Pharmacy told her to call here.

## 2021-05-22 ENCOUNTER — Ambulatory Visit: Payer: Medicaid Other | Admitting: Internal Medicine

## 2021-05-22 ENCOUNTER — Other Ambulatory Visit: Payer: Self-pay

## 2021-05-22 VITALS — BP 133/87 | HR 83 | Wt 235.5 lb

## 2021-05-22 DIAGNOSIS — M25511 Pain in right shoulder: Secondary | ICD-10-CM | POA: Diagnosis not present

## 2021-05-22 DIAGNOSIS — R52 Pain, unspecified: Secondary | ICD-10-CM | POA: Diagnosis not present

## 2021-05-22 DIAGNOSIS — H9312 Tinnitus, left ear: Secondary | ICD-10-CM

## 2021-05-22 DIAGNOSIS — H9192 Unspecified hearing loss, left ear: Secondary | ICD-10-CM

## 2021-05-22 DIAGNOSIS — R5383 Other fatigue: Secondary | ICD-10-CM | POA: Diagnosis not present

## 2021-05-22 DIAGNOSIS — D509 Iron deficiency anemia, unspecified: Secondary | ICD-10-CM

## 2021-05-22 DIAGNOSIS — M791 Myalgia, unspecified site: Secondary | ICD-10-CM

## 2021-05-22 DIAGNOSIS — R109 Unspecified abdominal pain: Secondary | ICD-10-CM

## 2021-05-22 DIAGNOSIS — M79661 Pain in right lower leg: Secondary | ICD-10-CM | POA: Diagnosis not present

## 2021-05-22 DIAGNOSIS — H9319 Tinnitus, unspecified ear: Secondary | ICD-10-CM | POA: Insufficient documentation

## 2021-05-22 NOTE — Assessment & Plan Note (Signed)
Patient complains of left ear pain as well as a sensation of fluid and as if something is crawling in her left ear.  States that she has been to the ER twice for her symptoms and prescribed antibiotics both times without improvement of her pain.  She endorses tinnitus and decreased hearing of her left ear as well.  She feels every time she moves there is a sensation of fluid moving around.  She did not follow-up with the ENT doctor after her last visit at the emergency department.  Denies any fevers, chills or recent illnesses.  On examination tympanic membranes were both visualized and normal in appearance.  No erythema or tenderness throughout the examination was noted.  No cervical lymphadenopathy noted.  - Referral to ENT for further work-up

## 2021-05-22 NOTE — Assessment & Plan Note (Signed)
Patient has a history of iron deficiency anemia thought to be secondary to fibroids.  Last iron studies were in 2019 which did show low iron.  Patient has not been taking iron supplements.  She does complain of bilateral leg cramping as well as often chewing ice.  I suspect her leg cramping and pica is in the setting of iron deficiency anemia.  Recommended that she resume taking her iron supplementation and we will check iron studies today.
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
## 2021-05-22 NOTE — Patient Instructions (Signed)
I am ordering some blood work to help figure out what may be causing your pain.  I will call you once I have the results.  In the meantime I recommend continuing her muscle relaxer and Tylenol as needed.  You can also try heat therapy.

## 2021-05-22 NOTE — Assessment & Plan Note (Signed)
Patient presents with diffuse pain of her right shoulder, bilateral upper extremities, lower back radiating to her lower abdomen as well as left anterior thigh.  She states the pain has been going on for several days.  She had an episode on Saturday where she felt extremely weak with diaphoresis and nausea where she felt she was nearly going to pass out.  This episode lasted about 15 or 20 minutes.  She does recall not eating that day.  She endorses good fluid intake.  Denies any trauma, falls or injuries.  She has been using her cane to ambulate due to the pain.  Denies any fevers, chills or recent sick contacts.  Denies any joint pain, rashes, erythema or joint deformities.  She has continued to have nausea since Saturday but denies any vomiting, diarrhea or constipation.  She has been using a muscle relaxer for her right neck/trapezius muscle spasm.  She does not take NSAIDs due to her chronic kidney disease.  She states that Tylenol does make her sleepy so she does not take this medication.  States Tylenol 3 has helped alleviate her pain in the past.  On examination she had multiple regions of tenderness to palpation with no obvious joint deformity or erythema.  She had normal range of motion of her extremities and slight limitation of her neck rotation and sidebending due to a muscle spasm felt in her right trapezius and cervical region.   Assessment/plan: Unclear etiology of patient's diffuse pain but I suspect this could be due to fibromyalgia.  Will obtain an ESR and CRP as well as iron studies and a TSH to exclude underlying etiology or inflammatory process.  I do suspect the iron deficiency anemia could be contributing to patient's leg pain.  If work-up is unremarkable would recommend starting duloxetine for fibromyalgia.  -Follow-up CBC, iron panel, ESR CRP and TSH

## 2021-05-22 NOTE — Progress Notes (Signed)
Internal Medicine Clinic Attending ° °Case discussed with Dr. Rehman  At the time of the visit.  We reviewed the resident’s history and exam and pertinent patient test results.  I agree with the assessment, diagnosis, and plan of care documented in the resident’s note.  ° °

## 2021-05-23 ENCOUNTER — Telehealth: Payer: Self-pay | Admitting: *Deleted

## 2021-05-23 LAB — CBC WITH DIFFERENTIAL/PLATELET
Basophils Absolute: 0.1 10*3/uL (ref 0.0–0.2)
Basos: 0 %
EOS (ABSOLUTE): 0.1 10*3/uL (ref 0.0–0.4)
Eos: 1 %
Hematocrit: 22.4 % — ABNORMAL LOW (ref 34.0–46.6)
Hemoglobin: 6.5 g/dL — CL (ref 11.1–15.9)
Immature Grans (Abs): 0 10*3/uL (ref 0.0–0.1)
Immature Granulocytes: 0 %
Lymphocytes Absolute: 1.3 10*3/uL (ref 0.7–3.1)
Lymphs: 9 %
MCH: 20.1 pg — ABNORMAL LOW (ref 26.6–33.0)
MCHC: 29 g/dL — ABNORMAL LOW (ref 31.5–35.7)
MCV: 69 fL — ABNORMAL LOW (ref 79–97)
Monocytes Absolute: 0.9 10*3/uL (ref 0.1–0.9)
Monocytes: 6 %
Neutrophils Absolute: 12.2 10*3/uL — ABNORMAL HIGH (ref 1.4–7.0)
Neutrophils: 84 %
Platelets: 326 10*3/uL (ref 150–450)
RBC: 3.23 x10E6/uL — ABNORMAL LOW (ref 3.77–5.28)
RDW: 17 % — ABNORMAL HIGH (ref 11.7–15.4)
WBC: 14.6 10*3/uL — ABNORMAL HIGH (ref 3.4–10.8)

## 2021-05-23 LAB — IRON,TIBC AND FERRITIN PANEL
Ferritin: 24 ng/mL (ref 15–150)
Iron Saturation: 4 % — CL (ref 15–55)
Iron: 13 ug/dL — ABNORMAL LOW (ref 27–159)
Total Iron Binding Capacity: 352 ug/dL (ref 250–450)
UIBC: 339 ug/dL (ref 131–425)

## 2021-05-23 LAB — SEDIMENTATION RATE: Sed Rate: 44 mm/hr — ABNORMAL HIGH (ref 0–32)

## 2021-05-23 LAB — C-REACTIVE PROTEIN: CRP: 45 mg/L — ABNORMAL HIGH (ref 0–10)

## 2021-05-23 LAB — TSH: TSH: 1.84 u[IU]/mL (ref 0.450–4.500)

## 2021-05-23 NOTE — Telephone Encounter (Signed)
Attempted to call patient back regarding her lab results. I could not leave a message on her cell phone, but was able to leave a message on her home phone to call me back at the [facility].   Lawerance Cruel, D.O.  Internal Medicine Resident, PGY-3 [facility]  Pager: [phone] 9:28 AM, 05/23/2021

## 2021-05-23 NOTE — Telephone Encounter (Addendum)
CRITICAL VALUE STICKER  CRITICAL VALUE: Hgb 6.5 and iron saturation 4  RECEIVER L. Silvano Rusk, BSN, RN-BC   DATE & TIME NOTIFIED: 05/23/21 at [place]  MD NOTIFIED: Dr. Marianna Payment  TIME OF NOTIFICATION: 8979  RESPONSE: He will call patient

## 2021-05-24 ENCOUNTER — Other Ambulatory Visit: Payer: Self-pay

## 2021-05-24 ENCOUNTER — Emergency Department (HOSPITAL_COMMUNITY): Payer: Medicaid Other

## 2021-05-24 ENCOUNTER — Inpatient Hospital Stay (HOSPITAL_COMMUNITY)
Admission: EM | Admit: 2021-05-24 | Discharge: 2021-05-28 | DRG: 832 | Disposition: A | Discharge status: Home / Self Care | Payer: Medicaid Other | Attending: Internal Medicine | Admitting: Internal Medicine

## 2021-05-24 ENCOUNTER — Telehealth: Payer: Self-pay

## 2021-05-24 ENCOUNTER — Encounter (HOSPITAL_COMMUNITY): Payer: Self-pay | Admitting: Emergency Medicine

## 2021-05-24 ENCOUNTER — Telehealth: Payer: Self-pay | Admitting: Internal Medicine

## 2021-05-24 DIAGNOSIS — Z7982 Long term (current) use of aspirin: Secondary | ICD-10-CM

## 2021-05-24 DIAGNOSIS — O26831 Pregnancy related renal disease, first trimester: Secondary | ICD-10-CM | POA: Diagnosis present

## 2021-05-24 DIAGNOSIS — O99611 Diseases of the digestive system complicating pregnancy, first trimester: Secondary | ICD-10-CM | POA: Diagnosis present

## 2021-05-24 DIAGNOSIS — F17210 Nicotine dependence, cigarettes, uncomplicated: Secondary | ICD-10-CM | POA: Diagnosis present

## 2021-05-24 DIAGNOSIS — N1831 Chronic kidney disease, stage 3a: Secondary | ICD-10-CM | POA: Diagnosis present

## 2021-05-24 DIAGNOSIS — Z79899 Other long term (current) drug therapy: Secondary | ICD-10-CM

## 2021-05-24 DIAGNOSIS — Z3201 Encounter for pregnancy test, result positive: Secondary | ICD-10-CM | POA: Diagnosis not present

## 2021-05-24 DIAGNOSIS — G43909 Migraine, unspecified, not intractable, without status migrainosus: Secondary | ICD-10-CM | POA: Diagnosis present

## 2021-05-24 DIAGNOSIS — K760 Fatty (change of) liver, not elsewhere classified: Secondary | ICD-10-CM | POA: Diagnosis not present

## 2021-05-24 DIAGNOSIS — N83201 Unspecified ovarian cyst, right side: Secondary | ICD-10-CM | POA: Diagnosis present

## 2021-05-24 DIAGNOSIS — Z3A00 Weeks of gestation of pregnancy not specified: Secondary | ICD-10-CM | POA: Diagnosis not present

## 2021-05-24 DIAGNOSIS — O161 Unspecified maternal hypertension, first trimester: Secondary | ICD-10-CM | POA: Diagnosis present

## 2021-05-24 DIAGNOSIS — F17200 Nicotine dependence, unspecified, uncomplicated: Secondary | ICD-10-CM | POA: Diagnosis present

## 2021-05-24 DIAGNOSIS — D509 Iron deficiency anemia, unspecified: Secondary | ICD-10-CM | POA: Diagnosis present

## 2021-05-24 DIAGNOSIS — R109 Unspecified abdominal pain: Secondary | ICD-10-CM | POA: Diagnosis not present

## 2021-05-24 DIAGNOSIS — R7989 Other specified abnormal findings of blood chemistry: Secondary | ICD-10-CM | POA: Diagnosis not present

## 2021-05-24 DIAGNOSIS — M109 Gout, unspecified: Secondary | ICD-10-CM | POA: Diagnosis present

## 2021-05-24 DIAGNOSIS — D649 Anemia, unspecified: Secondary | ICD-10-CM | POA: Diagnosis not present

## 2021-05-24 DIAGNOSIS — O26899 Other specified pregnancy related conditions, unspecified trimester: Secondary | ICD-10-CM | POA: Diagnosis not present

## 2021-05-24 DIAGNOSIS — D259 Leiomyoma of uterus, unspecified: Secondary | ICD-10-CM | POA: Diagnosis not present

## 2021-05-24 DIAGNOSIS — O99331 Smoking (tobacco) complicating pregnancy, first trimester: Secondary | ICD-10-CM | POA: Diagnosis present

## 2021-05-24 DIAGNOSIS — E871 Hypo-osmolality and hyponatremia: Secondary | ICD-10-CM | POA: Diagnosis present

## 2021-05-24 DIAGNOSIS — O3411 Maternal care for benign tumor of corpus uteri, first trimester: Secondary | ICD-10-CM | POA: Diagnosis not present

## 2021-05-24 DIAGNOSIS — I129 Hypertensive chronic kidney disease with stage 1 through stage 4 chronic kidney disease, or unspecified chronic kidney disease: Secondary | ICD-10-CM | POA: Diagnosis not present

## 2021-05-24 DIAGNOSIS — K5900 Constipation, unspecified: Secondary | ICD-10-CM | POA: Diagnosis not present

## 2021-05-24 DIAGNOSIS — I517 Cardiomegaly: Secondary | ICD-10-CM | POA: Diagnosis not present

## 2021-05-24 DIAGNOSIS — K219 Gastro-esophageal reflux disease without esophagitis: Secondary | ICD-10-CM | POA: Diagnosis present

## 2021-05-24 DIAGNOSIS — R531 Weakness: Secondary | ICD-10-CM | POA: Diagnosis not present

## 2021-05-24 DIAGNOSIS — I10 Essential (primary) hypertension: Secondary | ICD-10-CM | POA: Diagnosis present

## 2021-05-24 DIAGNOSIS — K0889 Other specified disorders of teeth and supporting structures: Secondary | ICD-10-CM | POA: Diagnosis not present

## 2021-05-24 DIAGNOSIS — Z951 Presence of aortocoronary bypass graft: Secondary | ICD-10-CM

## 2021-05-24 DIAGNOSIS — Z720 Tobacco use: Secondary | ICD-10-CM | POA: Diagnosis not present

## 2021-05-24 DIAGNOSIS — R102 Pelvic and perineal pain: Secondary | ICD-10-CM | POA: Diagnosis not present

## 2021-05-24 DIAGNOSIS — O99011 Anemia complicating pregnancy, first trimester: Principal | ICD-10-CM | POA: Diagnosis present

## 2021-05-24 DIAGNOSIS — N920 Excessive and frequent menstruation with regular cycle: Secondary | ICD-10-CM | POA: Diagnosis present

## 2021-05-24 DIAGNOSIS — Z8249 Family history of ischemic heart disease and other diseases of the circulatory system: Secondary | ICD-10-CM

## 2021-05-24 DIAGNOSIS — Z20822 Contact with and (suspected) exposure to covid-19: Secondary | ICD-10-CM | POA: Diagnosis present

## 2021-05-24 DIAGNOSIS — D252 Subserosal leiomyoma of uterus: Secondary | ICD-10-CM | POA: Diagnosis present

## 2021-05-24 DIAGNOSIS — N179 Acute kidney failure, unspecified: Secondary | ICD-10-CM | POA: Diagnosis present

## 2021-05-24 DIAGNOSIS — I2510 Atherosclerotic heart disease of native coronary artery without angina pectoris: Secondary | ICD-10-CM | POA: Diagnosis present

## 2021-05-24 DIAGNOSIS — R0602 Shortness of breath: Secondary | ICD-10-CM | POA: Diagnosis not present

## 2021-05-24 DIAGNOSIS — D72828 Other elevated white blood cell count: Secondary | ICD-10-CM | POA: Diagnosis present

## 2021-05-24 LAB — CBC WITH DIFFERENTIAL/PLATELET
Abs Immature Granulocytes: 0.11 10*3/uL — ABNORMAL HIGH (ref 0.00–0.07)
Basophils Absolute: 0 10*3/uL (ref 0.0–0.1)
Basophils Relative: 0 %
Eosinophils Absolute: 0.1 10*3/uL (ref 0.0–0.5)
Eosinophils Relative: 1 %
HCT: 22.6 % — ABNORMAL LOW (ref 36.0–46.0)
Hemoglobin: 6.1 g/dL — CL (ref 12.0–15.0)
Immature Granulocytes: 1 %
Lymphocytes Relative: 8 %
Lymphs Abs: 1.1 10*3/uL (ref 0.7–4.0)
MCH: 19.2 pg — ABNORMAL LOW (ref 26.0–34.0)
MCHC: 27 g/dL — ABNORMAL LOW (ref 30.0–36.0)
MCV: 71.3 fL — ABNORMAL LOW (ref 80.0–100.0)
Monocytes Absolute: 0.9 10*3/uL (ref 0.1–1.0)
Monocytes Relative: 7 %
Neutro Abs: 11.9 10*3/uL — ABNORMAL HIGH (ref 1.7–7.7)
Neutrophils Relative %: 83 %
Platelets: 321 10*3/uL (ref 150–400)
RBC: 3.17 MIL/uL — ABNORMAL LOW (ref 3.87–5.11)
RDW: 18.6 % — ABNORMAL HIGH (ref 11.5–15.5)
WBC: 14.2 10*3/uL — ABNORMAL HIGH (ref 4.0–10.5)
nRBC: 0 % (ref 0.0–0.2)

## 2021-05-24 LAB — COMPREHENSIVE METABOLIC PANEL
ALT: 11 U/L (ref 0–44)
AST: 11 U/L — ABNORMAL LOW (ref 15–41)
Albumin: 3.4 g/dL — ABNORMAL LOW (ref 3.5–5.0)
Alkaline Phosphatase: 55 U/L (ref 38–126)
Anion gap: 9 (ref 5–15)
BUN: 18 mg/dL (ref 6–20)
CO2: 22 mmol/L (ref 22–32)
Calcium: 8.9 mg/dL (ref 8.9–10.3)
Chloride: 103 mmol/L (ref 98–111)
Creatinine, Ser: 1.48 mg/dL — ABNORMAL HIGH (ref 0.44–1.00)
GFR, Estimated: 45 mL/min — ABNORMAL LOW (ref >60)
Glucose, Bld: 107 mg/dL — ABNORMAL HIGH (ref 70–99)
Potassium: 3.9 mmol/L (ref 3.5–5.1)
Sodium: 134 mmol/L — ABNORMAL LOW (ref 135–145)
Total Bilirubin: 0.8 mg/dL (ref 0.3–1.2)
Total Protein: 7.4 g/dL (ref 6.5–8.1)

## 2021-05-24 LAB — ABO/RH: ABO/RH(D): O POS

## 2021-05-24 LAB — LIPASE, BLOOD: Lipase: 22 U/L (ref 11–51)

## 2021-05-24 LAB — URINALYSIS, ROUTINE W REFLEX MICROSCOPIC
Bilirubin Urine: NEGATIVE
Glucose, UA: NEGATIVE mg/dL
Ketones, ur: NEGATIVE mg/dL
Leukocytes,Ua: NEGATIVE
Nitrite: NEGATIVE
Protein, ur: NEGATIVE mg/dL
Specific Gravity, Urine: 1.01 (ref 1.005–1.030)
pH: 5.5 (ref 5.0–8.0)

## 2021-05-24 LAB — RESP PANEL BY RT-PCR (FLU A&B, COVID) ARPGX2
Influenza A by PCR: NEGATIVE
Influenza B by PCR: NEGATIVE
SARS Coronavirus 2 by RT PCR: NEGATIVE

## 2021-05-24 LAB — PREPARE RBC (CROSSMATCH)

## 2021-05-24 LAB — POC OCCULT BLOOD, ED: Fecal Occult Bld: NEGATIVE

## 2021-05-24 LAB — URINALYSIS, MICROSCOPIC (REFLEX)

## 2021-05-24 LAB — HCG, QUANTITATIVE, PREGNANCY: hCG, Beta Chain, Quant, S: 330 m[IU]/mL — ABNORMAL HIGH (ref <5)

## 2021-05-24 MED ORDER — POLYETHYLENE GLYCOL 3350 17 G PO PACK
17.0000 g | PACK | Freq: Every day | ORAL | Status: DC
  Administered 2021-05-26 – 2021-05-28 (×3): 17 g via ORAL
  Filled 2021-05-24 (×3): qty 1

## 2021-05-24 MED ORDER — ACETAMINOPHEN 650 MG RE SUPP: 650.0000 mg | Freq: Four times a day (QID) | RECTAL | Status: DC | PRN

## 2021-05-24 MED ORDER — HYDROMORPHONE HCL 1 MG/ML IJ SOLN
1.0000 mg | INTRAMUSCULAR | Status: DC | PRN
  Administered 2021-05-25: 1 mg via INTRAVENOUS
  Filled 2021-05-24: qty 1

## 2021-05-24 MED ORDER — ONDANSETRON HCL 4 MG/2ML IJ SOLN
4.0000 mg | Freq: Once | INTRAMUSCULAR | Status: AC
  Administered 2021-05-24: 4 mg via INTRAVENOUS
  Filled 2021-05-24: qty 2

## 2021-05-24 MED ORDER — OXYCODONE HCL 5 MG PO TABS
5.0000 mg | ORAL_TABLET | ORAL | Status: DC | PRN
  Administered 2021-05-25 – 2021-05-27 (×5): 5 mg via ORAL
  Filled 2021-05-24 (×5): qty 1

## 2021-05-24 MED ORDER — ONDANSETRON HCL 4 MG PO TABS
4.0000 mg | ORAL_TABLET | Freq: Four times a day (QID) | ORAL | Status: DC | PRN
  Administered 2021-05-27: 4 mg via ORAL
  Filled 2021-05-24: qty 1

## 2021-05-24 MED ORDER — MORPHINE SULFATE (PF) 4 MG/ML IV SOLN
4.0000 mg | Freq: Once | INTRAVENOUS | Status: AC
  Administered 2021-05-24: 4 mg via INTRAVENOUS
  Filled 2021-05-24: qty 1

## 2021-05-24 MED ORDER — SENNOSIDES-DOCUSATE SODIUM 8.6-50 MG PO TABS: 1.0000 | ORAL_TABLET | Freq: Every evening | ORAL | Status: DC | PRN

## 2021-05-24 MED ORDER — ATORVASTATIN CALCIUM 40 MG PO TABS: 40.0000 mg | ORAL_TABLET | Freq: Every day | ORAL | Status: DC

## 2021-05-24 MED ORDER — ACETAMINOPHEN 325 MG PO TABS: 650.0000 mg | ORAL_TABLET | Freq: Four times a day (QID) | ORAL | Status: DC | PRN

## 2021-05-24 MED ORDER — POLYETHYLENE GLYCOL 3350 17 G PO PACK: 17.0000 g | PACK | Freq: Every day | ORAL | Status: DC | PRN

## 2021-05-24 MED ORDER — SODIUM CHLORIDE 0.9 % IV BOLUS
1000.0000 mL | Freq: Once | INTRAVENOUS | Status: AC
Start: 2021-05-24 — End: 2021-05-24
  Administered 2021-05-24: 1000 mL via INTRAVENOUS

## 2021-05-24 MED ORDER — NICOTINE 7 MG/24HR TD PT24
7.0000 mg | MEDICATED_PATCH | Freq: Every day | TRANSDERMAL | Status: DC
  Administered 2021-05-25 – 2021-05-28 (×4): 7 mg via TRANSDERMAL
  Filled 2021-05-24 (×5): qty 1

## 2021-05-24 MED ORDER — ONDANSETRON HCL 4 MG/2ML IJ SOLN
4.0000 mg | Freq: Four times a day (QID) | INTRAMUSCULAR | Status: DC | PRN
  Administered 2021-05-25: 4 mg via INTRAVENOUS
  Filled 2021-05-24: qty 2

## 2021-05-24 MED ORDER — IOHEXOL 300 MG/ML  SOLN
100.0000 mL | Freq: Once | INTRAMUSCULAR | Status: AC | PRN
  Administered 2021-05-24: 100 mL via INTRAVENOUS

## 2021-05-24 MED ORDER — PANTOPRAZOLE SODIUM 40 MG PO TBEC
40.0000 mg | DELAYED_RELEASE_TABLET | Freq: Every day | ORAL | Status: DC
  Administered 2021-05-25 – 2021-05-26 (×2): 40 mg via ORAL
  Filled 2021-05-24 (×2): qty 1

## 2021-05-24 MED ORDER — ACETAMINOPHEN 500 MG PO TABS
1000.0000 mg | ORAL_TABLET | Freq: Three times a day (TID) | ORAL | Status: DC
  Administered 2021-05-25 – 2021-05-27 (×8): 1000 mg via ORAL
  Filled 2021-05-24 (×8): qty 2

## 2021-05-24 MED ORDER — SODIUM CHLORIDE 0.9 % IV SOLN: 10.0000 mL/h | Freq: Once | INTRAVENOUS | Status: DC

## 2021-05-24 NOTE — ED Provider Notes (Signed)
[facility] EMERGENCY DEPARTMENT Provider Note   CSN: [id number] Arrival date & time: 05/24/21  1246     History  Chief Complaint  Patient presents with   Anemia    Jeanette Gamble is a 41 y.o. female hx of iron deficiency anemia, no longer taking iron supplementation, hypertension, chronic gout, heavy menstrual cycles here for evaluation of feeling unwell.  She has felt well over the last few days.  States she feels weak, fatigued as well as has some diffuse myalgias.  Lightheaded when she goes from sitting to standing.  States she does typically have heavy menstrual cycles >last menstrual cycle January 10 which lasted approximately 7 days.  States she has a history of fibroid uterus.  She has noted some darker stools over the last few months. Last BM 2-3 days ago which is typical for her. Has some generalized lower abdominal cramping however denies any epigastric pain.  Denies any chronic NSAIDs, chronic EtOH use.  She denies any prior history of GI bleeds.  She is not currently taking any Pepto-Bismol or her iron supplementation.  She is not any anticoagulation.  Also does admit to some right-sided dental pain and some facial swelling which began yesterday.  Described as aching.  No drooling, dysphagia, trismus.  Nose overlying erythema, warmth.  No submandibular swelling, difficulty tolerating p.o. intake.  No fever, cough, congestion, rhinorrhea, chest pain, dysuria, hematuria.  She denies any gingival bleeding or epistaxis. Denies chance of pregnancy.  Seen by PCP few days ago who took labs and noted new anemia, rec admission here for transfusion and possible GI consult.  HPI     Home Medications Prior to Admission medications   Medication Sig Start Date End Date Taking? Authorizing Provider  allopurinol (ZYLOPRIM) 100 MG tablet TAKE 2 TABLETS (200 MG TOTAL) BY MOUTH DAILY. 04/24/21  Yes Aslam, Loralyn Freshwater, MD  amLODipine (NORVASC) 10 MG tablet Take 1 tablet (10 mg total)  by mouth daily. 04/30/21  Yes Aslam, Loralyn Freshwater, MD  aspirin EC 81 MG EC tablet Take 1 tablet (81 mg total) by mouth daily. Swallow whole. 01/21/20  Yes Kathyrn Drown D, NP  atorvastatin (LIPITOR) 40 MG tablet TAKE 1 TABLET (40 MG TOTAL) BY MOUTH DAILY. 06/20/19  Yes Ladell Pier, MD  diclofenac Sodium (VOLTAREN) 1 % GEL APPLY 2 GRAMS TOPICALLY 4 (FOUR) TIMES DAILY. Patient taking differently: 2 g daily as needed (pain). 04/24/21  Yes Aslam, Loralyn Freshwater, MD  hydrALAZINE (APRESOLINE) 50 MG tablet Take 1 tablet (50 mg total) by mouth every 8 (eight) hours. 04/30/21  Yes Aslam, Loralyn Freshwater, MD  hydrochlorothiazide (HYDRODIURIL) 25 MG tablet Take 1 tablet (25 mg total) by mouth daily. 04/30/21  Yes Aslam, Loralyn Freshwater, MD  lisinopril (ZESTRIL) 40 MG tablet Take 1 tablet (40 mg total) by mouth daily. 04/30/21  Yes Aslam, Sadia, MD  MITIGARE 0.6 MG CAPS TAKE 1 CAPSULE (0.6 MG TOTAL) BY MOUTH DAILY. Patient taking differently: Take 0.6 mg by mouth daily. 01/02/21  Yes Aslam, Loralyn Freshwater, MD  omeprazole (PRILOSEC) 20 MG capsule Take 1 capsule (20 mg total) by mouth daily. 04/30/21  Yes Aslam, Loralyn Freshwater, MD  tiZANidine (ZANAFLEX) 4 MG tablet Take 4 mg by mouth every 8 (eight) hours as needed for muscle spasms. 04/19/21  Yes [provider]  amoxicillin (AMOXIL) 500 MG capsule Take 1 capsule (500 mg total) by mouth 3 (three) times daily. Patient not taking: Reported on 05/24/2021 02/03/21   Larene Pickett, PA-C  amoxicillin-clavulanate (AUGMENTIN) 875-125 MG tablet  Take 1 tablet by mouth every 12 (twelve) hours. Patient not taking: Reported on 05/24/2021 08/25/20   Sharion Balloon, NP  cyclobenzaprine (FLEXERIL) 5 MG tablet Take 1 tablet (5 mg total) by mouth 3 (three) times daily as needed for muscle spasms. Patient not taking: Reported on 05/24/2021 05/31/20   Jose Persia, MD  ferrous sulfate 325 (65 FE) MG tablet Take 1 tablet (325 mg total) by mouth 2 (two) times daily with a meal. Patient not taking: Reported on 05/24/2021 03/30/19    Ladell Pier, MD  neomycin-polymyxin-hydrocortisone (CORTISPORIN) 3.5-10000-1 OTIC suspension Place 4 drops into both ears 4 (four) times daily. X 7 days Patient not taking: Reported on 05/24/2021 08/20/20   Margarita Mail, PA-C  varenicline (CHANTIX STARTING MONTH PAK) 0.5 MG X 11 & 1 MG X 42 tablet Take one 0.5 mg tablet by mouth once daily for 3 days, then increase to one 0.5 mg tablet twice daily for 4 days, then increase to one 1 mg tablet twice daily. Patient not taking: Reported on 08/24/2019 03/03/19   Ladell Pier, MD      Allergies    Gabapentin, Naproxen, and Tramadol    Review of Systems   Review of Systems  Constitutional:  Positive for fatigue. Negative for chills and fever.  HENT:  Positive for dental problem (right lower dentition) and facial swelling. Negative for congestion, ear pain and sore throat.   Eyes:  Negative for pain and visual disturbance.  Respiratory: Negative.  Negative for cough and shortness of breath.   Cardiovascular: Negative.  Negative for chest pain and palpitations.  Gastrointestinal:  Positive for abdominal pain. Negative for abdominal distention, anal bleeding, blood in stool, constipation, diarrhea, nausea, rectal pain and vomiting.  Genitourinary: Negative.  Negative for dysuria and hematuria.  Musculoskeletal:  Positive for myalgias. Negative for arthralgias and back pain.  Skin: Negative.  Negative for color change and rash.  Neurological:  Positive for weakness (generalized) and light-headedness. Negative for dizziness, tremors, seizures, syncope, facial asymmetry, speech difficulty, numbness and headaches.  All other systems reviewed and are negative.  Physical Exam Updated Vital Signs BP 134/86    Pulse 77    Temp 98.7 F (37.1 C) (Oral)    Resp 18    SpO2 100%  Physical Exam Vitals and nursing note reviewed.  Constitutional:      General: She is not in acute distress.    Appearance: She is well-developed. She is ill-appearing.  She is not toxic-appearing or diaphoretic.  HENT:     Head: Normocephalic and atraumatic.     Comments: Minimal right-sided mandibular swelling.  Does not extend to submandibular region, no overlying erythema, warmth, induration.  No evidence of Ludwig's angina    Nose: Nose normal.     Mouth/Throat:     Comments: Right lower dental pain with some gingival erythema without any obvious drainable abscess.  Sublingual area soft.  No pooling of secretions.  No drooling, dysphagia or trismus Eyes:     Pupils: Pupils are equal, round, and reactive to light.  Neck:     Comments: No neck stiffness or neck rigidity, no meningismus Cardiovascular:     Rate and Rhythm: Normal rate.     Pulses: Normal pulses.     Heart sounds: Normal heart sounds.  Pulmonary:     Effort: Pulmonary effort is normal. No respiratory distress.     Breath sounds: Normal breath sounds.     Comments: Clear bilaterally, speaks in full sentences  Abdominal:     General: Bowel sounds are normal. There is no distension.     Palpations: Abdomen is soft. There is no mass.     Tenderness: There is abdominal tenderness. There is no right CVA tenderness, left CVA tenderness, guarding or rebound.     Hernia: No hernia is present.     Comments: Diffuse tenderness lower abdomen, no focal pain  Musculoskeletal:        General: Normal range of motion.     Cervical back: Normal range of motion and neck supple.     Comments: No bony tenderness, full range of motion, compartment soft  Skin:    General: Skin is warm and dry.     Capillary Refill: Capillary refill takes less than 2 seconds.     Coloration: Skin is pale.     Comments: Appears pale, no edema, erythema or warmth  Neurological:     General: No focal deficit present.     Mental Status: She is alert and oriented to person, place, and time.     Comments: CN 2-12 grossly intact Equal strength Intact sensation  Psychiatric:        Mood and Affect: Mood normal.   ED  Results / Procedures / Treatments   Labs (all labs ordered are listed, but only abnormal results are displayed) Labs Reviewed  CBC WITH DIFFERENTIAL/PLATELET - Abnormal; Notable for the following components:      Result Value   WBC 14.2 (*)    RBC 3.17 (*)    Hemoglobin 6.1 (*)    HCT 22.6 (*)    MCV 71.3 (*)    MCH 19.2 (*)    MCHC 27.0 (*)    RDW 18.6 (*)    Neutro Abs 11.9 (*)    Abs Immature Granulocytes 0.11 (*)    All other components within normal limits  COMPREHENSIVE METABOLIC PANEL - Abnormal; Notable for the following components:   Sodium 134 (*)    Glucose, Bld 107 (*)    Creatinine, Ser 1.48 (*)    Albumin 3.4 (*)    AST 11 (*)    GFR, Estimated 45 (*)    All other components within normal limits  URINALYSIS, ROUTINE W REFLEX MICROSCOPIC - Abnormal; Notable for the following components:   Hgb urine dipstick TRACE (*)    All other components within normal limits  HCG, QUANTITATIVE, PREGNANCY - Abnormal; Notable for the following components:   hCG, Beta Chain, Quant, S 330 (*)    All other components within normal limits  URINALYSIS, MICROSCOPIC (REFLEX) - Abnormal; Notable for the following components:   Bacteria, UA RARE (*)    All other components within normal limits  RESP PANEL BY RT-PCR (FLU A&B, COVID) ARPGX2  LIPASE, BLOOD  CK  POC OCCULT BLOOD, ED  TYPE AND SCREEN  ABO/RH  PREPARE RBC (CROSSMATCH)    EKG None  Radiology US OB Comp < 14 Wks  Result Date: 05/24/2021 CLINICAL DATA:  Pelvic pain. EXAM: OBSTETRIC <14 WK [place] AND TRANSVAGINAL OB US TECHNIQUE: Both transabdominal and transvaginal ultrasound examinations were performed for complete evaluation of the gestation as well as the maternal uterus, adnexal regions, and pelvic cul-de-sac. Transvaginal technique was performed to assess early pregnancy. COMPARISON:  03/24/2019 FINDINGS: Intrauterine gestational sac: None identified Maternal uterus/adnexae: The uterus is anteverted. The uterus measures  16.5 x 11.8 x 13.3 cm. Uterine volume is 1352 cc. The myometrial echotexture is diffusely coarsened in keeping with uterine fibroids. The dominant  fibroid is seen within the anterior fundus measuring 8.9 x 8.8 x 0.8 cm. And exophytic fibroid is seen arising from the left fundus measuring 4.3 x 4.0 x 5.4 cm. The endometrial stripe is poorly visualized, but measures roughly 8 mm in thickness. The right ovary measures 5.7 x 4.0 x 4.9 cm and contains a 4.4 cm complex cystic lesion demonstrating diffuse internal echoes most in keeping with a hemorrhagic cyst or, less likely, an endometrioma. A trace amount of free fluid is seen within the right adnexa. The left ovary is not visualized. IMPRESSION: No intrauterine gestational sac identified. Multi fibroid uterus. 4.4 cm right adnexal cyst most in keeping with a hemorrhagic cyst. Electronically Signed   By: Fidela Salisbury M.D.   On: 05/24/2021 21:54   US OB Transvaginal  Result Date: 05/24/2021 CLINICAL DATA:  Pain EXAM: TRANSVAGINAL OB ULTRASOUND TECHNIQUE: Transvaginal ultrasound was performed for complete evaluation of the gestation as well as the maternal uterus, adnexal regions, and pelvic cul-de-sac. COMPARISON:  None. FINDINGS: Intrauterine gestational sac: None Yolk sac:  Not Visualized. Embryo:  Not Visualized. Cardiac Activity: Not Visualized. Heart Rate:  bpm MSD:   mm    w     d CRL:     mm    w  d                  [place] EDC: Subchorionic hemorrhage:  None visualized. Maternal uterus/adnexae: Enlarged fibroid uterus. 9 cm central fibroid. Exophytic anterior left fundal fibroid measures up to 5.4 cm. Endometrium difficult to visualize due to central fibroid. 4.4 cm hemorrhagic cyst in the right ovary. Trace free fluid in the pelvis. IMPRESSION: No intrauterine pregnancy visualized. Differential considerations would include early intrauterine pregnancy too early to visualize, spontaneous abortion, or occult ectopic pregnancy. Recommend close clinical followup and  serial quantitative beta HCGs and ultrasounds. Fibroid uterus.  Large central fibroid measures up to 9 cm. 4.4 cm right ovarian hemorrhagic cyst. Electronically Signed   By: Rolm Baptise M.D.   On: 05/24/2021 21:46   CT ABDOMEN PELVIS W CONTRAST  Result Date: 05/24/2021 CLINICAL DATA:  A 41 year old female presents with LEFT lower quadrant pain. EXAM: CT ABDOMEN AND PELVIS WITH CONTRAST TECHNIQUE: Multidetector CT imaging of the abdomen and pelvis was performed using the standard protocol following bolus administration of intravenous contrast. RADIATION DOSE REDUCTION: This exam was performed according to the departmental dose-optimization program which includes automated exposure control, adjustment of the mA and/or kV according to patient size and/or use of iterative reconstruction technique. CONTRAST:  171mL OMNIPAQUE IOHEXOL 300 MG/ML  SOLN COMPARISON:  Comparison made with March 01/09/2020. FINDINGS: Lower chest: Incidental imaging of the lung bases is unremarkable, no effusion or sign of consolidation. Hepatobiliary: No focal, suspicious hepatic lesion. No pericholecystic stranding. No biliary duct dilation. Portal vein is patent. Hepatic steatosis. Pancreas: Normal, without mass, inflammation or ductal dilatation. Spleen: Normal. Adrenals/Urinary Tract: Adrenal glands are unremarkable. Symmetric renal enhancement. No sign of hydronephrosis. No suspicious renal lesion or perinephric stranding. Urinary bladder is grossly unremarkable. Stomach/Bowel: Appendix is normal. No perigastric stranding. No sign of small bowel dilation to suggest acute small bowel process. No signs of inflammation adjacent to the colon or small bowel. Stool in the colon. Vascular/Lymphatic: Aorta with smooth contours. IVC with smooth contours. No aneurysmal dilation of the abdominal aorta. There is no gastrohepatic or hepatoduodenal ligament lymphadenopathy. No retroperitoneal or mesenteric lymphadenopathy. No pelvic sidewall  lymphadenopathy. Reproductive: Multiple leiomyomata in the uterus, 3 large leiomyomata in  particular, the first a subserosal leiomyoma that extends cephalad from the uterine fundus measuring 7.9 x 7.4 cm greatest axial dimension. The second a subserosal leiomyoma that extends from the uterine fundus towards the LEFT adnexa measuring 8.0 x 6.8 cm. The third large leiomyoma that was seen previously in the mid uterus and previously measured approximately 7.4 x 7.4 cm now measures 8.6 x 8.1 cm in shows diffuse low attenuation relative to other leiomyomas that are seen. Mildly irregular margins of this leiomyoma are noted along the cephalad aspect, unclear whether this is related to the adjacent endometrium which may be the case though this is not clear (image 59/3) this area measuring 2.6 x 1.9 cm. RIGHT ovarian cyst measuring 5.1 x 3.5 cm appears thin walled and previously measured approximately 3.2 cm. Probable corpus luteum in the LEFT ovary. LEFT ovary grossly unchanged otherwise compared to previous imaging. Other: No ascites.  No pneumoperitoneum. Musculoskeletal: No acute or significant osseous findings. IMPRESSION: Large leiomyoma in the mid uterus anteriorly with considerable enlargement now with little internal enhancement as compared to previous imaging and as compared to adjacent leiomyomata. This likely represents a large degenerating leiomyoma and may explain the patient's symptoms. Gyn consultation may be helpful. Irregularity along the RIGHT lateral aspect of leiomyoma may represent another smaller degenerating fibroid or "trapped" endometrium in the RIGHT uterine fundus due to marked fibroid enlargement. Would suggest follow-up to ensure that this does not represent invasive border as invasive border can be associated with malignant transformation. Short interval follow-up could be considered either with pelvic sonogram initially or MRI as warranted. Greater than 5 cm cyst associated with the RIGHT  ovary. Follow-up by [place] is recommended in 3-6 months or sooner if symptoms worsen or localized to this area. Note: This recommendation does not apply to premenarchal patients and to those with increased risk (genetic, family history, elevated tumor markers or other high-risk factors) of ovarian cancer. Reference: JACR 2020 Feb; 17(2):248-254 Electronically Signed   By: Zetta Bills M.D.   On: 05/24/2021 17:26   DG Chest Portable 1 View  Result Date: 05/24/2021 CLINICAL DATA:  Shortness of breath. EXAM: PORTABLE CHEST 1 VIEW COMPARISON:  January 17, 2020. FINDINGS: Stable cardiomegaly. Both lungs are clear. The visualized skeletal structures are unremarkable. IMPRESSION: No active disease. Electronically Signed   By: Marijo Conception M.D.   On: 05/24/2021 16:09    Procedures .Critical Care Performed by: Nettie Elm, PA-C Authorized by: Nettie Elm, PA-C   Critical care provider statement:    Critical care time (minutes):  35   Critical care was necessary to treat or prevent imminent or life-threatening deterioration of the following conditions:  Circulatory failure   Critical care was time spent personally by me on the following activities:  Development of treatment plan with patient or surrogate, discussions with consultants, evaluation of patient's response to treatment, examination of patient, ordering and review of laboratory studies, ordering and review of radiographic studies, ordering and performing treatments and interventions, pulse oximetry, re-evaluation of patient's condition and review of old charts    Medications Ordered in ED Medications  0.9 %  sodium chloride infusion (has no administration in time range)  sodium chloride 0.9 % bolus 1,000 mL (1,000 mLs Intravenous New Bag/Given 05/24/21 1626)  ondansetron (ZOFRAN) injection 4 mg (4 mg Intravenous Given 05/24/21 1627)  morphine (PF) 4 MG/ML injection 4 mg (4 mg Intravenous Given 05/24/21 1627)  iohexol (OMNIPAQUE) 300  MG/ML solution 100 mL (100 mLs Intravenous  Contrast Given 05/24/21 1651)   ED Course/ Medical Decision Making/ A&P   41 year old here for abnormal lab.  Seen by PCP, reviewed their records from a few days ago which it was seen for generalized weakness, myalgias.  Noted to have new onset anemia.  Patient had endorsed some dark stools over the last few months however no chronic NSAID use, chronic EtOH use or history of prior GI bleeds.  On arrival she does appear pale.  She has some swelling to her left lower dentition and gingival area which I suspect is early dental infection.  Reassuring she has no sublingual, submandibular swelling.  Low suspicion for deep space infection, Ludwig's angina.  She has no neck stiffness or neck rigidity.  She denies any mucosal bleeding, epistaxis.  Does admit to some chronic heavy menstrual cycles due to prior diagnosis of fibroids with her last menstrual cycle greater than 20 days ago.  She is afebrile, nonseptic, not ill-appearing.  Heart and lungs clear.  Her abdomen is diffusely tender to lower abdomen without any focal pain.  She denies any urinary symptoms, vaginal discharge or any concerns for any STDs.  Patient would like brown stool on rectal exam, occult negative  Labs and imaging personally reviewed and interpreted:  CBC leukocytosis 14.2 denies any recent steroid use, suspect leukocytosis from dental infection.  Hemoglobin 6.1, downtrending from baseline at around 11 Metabolic panel sodium 124, creatinine 1.48 mild increase from baseline Lipase 22 Occult neg DG chest without without acute abnormality EKG without ischemic changes CT AP fibroid uterus, right cyst  Unfortunately CT done without pregnancy test completed. Will add. Patient denies chance of pregnancy at this time.   Patient getting 2 units of blood at this time.  Quant Preg test positive at 330. Discussed with patient. Adamantly denies chance of pregnancy however is sexually active daily per  patient. Will touch base with ObGyn. Patient denies vaginal bleeding.  CONSULT with Dr. Elonda Husky with Gyn. Rec medicine admit. Will need serial Hcg. Can FU outpatient or can see inpatient if needed  Will get [place] in ED.  Ultrasound does not show IUP, recommend serial quant.  No free fluid in pelvis to suggest ruptured ectopic pregnancy.  Patient denies any vaginal bleeding.  Overall unclear source of her anemia certainly be iron deficiency given history of this  Patient critically ill with symptomatic anemia no obvious source of bleed at this time.  Will transfuse, admit for further management and work up.  CONSULT with IM teaching will evaluate patient for admission  The patient appears reasonably stabilized for admission considering the current resources, flow, and capabilities available in the ED at this time, and I doubt any other [facility] requiring further screening and/or treatment in the ED prior to admission.                            Medical Decision Making Amount and/or Complexity of Data Reviewed External Data Reviewed: labs, radiology and notes.    Details: PCP labs from 2 days ago Hgb 6.5 Labs: ordered. Decision-making details documented in ED Course. Radiology: ordered and independent interpretation performed. Decision-making details documented in ED Course. ECG/medicine tests: ordered and independent interpretation performed. Decision-making details documented in ED Course.  Risk OTC drugs. Prescription drug management. Parenteral controlled substances. Drug therapy requiring intensive monitoring for toxicity. Decision regarding hospitalization.         Final Clinical Impression(s) / ED Diagnoses Final diagnoses:  Symptomatic anemia  Positive pregnancy test  Uterine leiomyoma, unspecified location  Pain, dental    Rx / DC Orders ED Discharge Orders     None         Jamirra Curnow A, PA-C 05/24/21 2206    Luna Fuse, MD 05/31/21 [phone]

## 2021-05-24 NOTE — ED Provider Triage Note (Signed)
Emergency Medicine Provider Triage Evaluation Note  Jeanette Gamble , a 41 y.o. female  was evaluated in triage.  Pt complains of anemia onset 2 days.  Patient notes that she was called by her primary care provider due to a hemoglobin of 6.5.  Patient has associated infrequent melena, nausea, dizziness, palpitations, shortness of breath.  Her last bowel movement was a couple days ago and was dark and tarry.  She does have a history of GERD and takes omeprazole and recently ran out of her medications.  Has not tried medication for her symptoms.  Denies frequent NSAID use, EtOH.  Patient notes that she does consume marijuana.  Denies vomiting or bright red blood in stool. Denies anticoagulant use.  Review of Systems  Positive: As per HPI above Negative: Vomiting or bright red blood in stool.  Physical Exam  BP (!) 142/98 (BP Location: Right Arm)    Pulse (!) 110    Temp 99.8 F (37.7 C) (Oral)    Resp 18    SpO2 98%  Gen:   Awake, no distress   Resp:  Normal effort  MSK:   Moves extremities without difficulty  Other:  Tenderness to palpation noted diffusely throughout abdomen, more so in suprapubic and lower abdominal region.  No overlying skin changes.  Medical Decision Making  Medically screening exam initiated at 1:23 PM.  Appropriate orders placed.  Jeanette Gamble was informed that the remainder of the evaluation will be completed by another provider, this initial triage assessment does not replace that evaluation, and the importance of remaining in the ED until their evaluation is complete.  1:36 PM - Discussed with RN that patient is in need of a room immediately. RN aware and working on room placement.    Jeanette Gamble A, PA-C 05/24/21 1336
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
## 2021-05-24 NOTE — ED Triage Notes (Signed)
Patient told to go to ED by phone due to anemia, Hgb two days ago 6.5. Patient denies significant source of bleeding, reports infrequent dark stools over the last few days. Patient reports nausea, dizziness and weakness, is alert and oriented at this time.

## 2021-05-24 NOTE — Hospital Course (Signed)
Reports abdominal pain, "feels hard" for last three nights. Lower abdomen. No matter how she slept, radiation to and from back. Constant. Tried muscle relaxer, tylenol (reports it put her to sleep...). Tylenol helped minimally. Can "barely eat" - tried to eat two hot dogs. Last BM 2-3d ago, has not had BM since. Last couple of days "tasting blood". Having nausea, no vomiting. GERD worsening. Having dark brown BM. Maybe 1-2 instances of black/tarry stool. One episode of bleeding after straining (last year). No hematuria.  Two previous "spells" before -   But on Saturday, in the kitchen cooking, felt like she was dizzy, nausea, felt like she was going to pass out. Pressure-like headache on right side. Also reports decreased hearing and blurry vision (only happens during these episodes). Thought she had to use bathroom, didn';t help. Went outside thinking she was overheated. Bent over, didn't help, continued blurry vision. Went back in, felt like heart was "falling down" and felt like she was going to die. Laid back on cough, closed eyes, still felt pressure on R side. Recently these episodes more frequent. After that she was not h   Heavy, painful periods. Has "asked for hysterectomies, but too young" - 2003/2004.  Last period ~1/10.  Currently sexually active. No protection. Monogamous, one partner.   Hx ovarian cysts, fibroids, enlarged uterine  Chronic dyspnea and chest pain, no changes. No dysuria, discharge, pruritis.
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
## 2021-05-24 NOTE — Telephone Encounter (Signed)
Requesting lab results, please call pt back.  

## 2021-05-25 DIAGNOSIS — K219 Gastro-esophageal reflux disease without esophagitis: Secondary | ICD-10-CM | POA: Diagnosis not present

## 2021-05-25 DIAGNOSIS — G43909 Migraine, unspecified, not intractable, without status migrainosus: Secondary | ICD-10-CM

## 2021-05-25 DIAGNOSIS — Z951 Presence of aortocoronary bypass graft: Secondary | ICD-10-CM | POA: Diagnosis not present

## 2021-05-25 DIAGNOSIS — D72828 Other elevated white blood cell count: Secondary | ICD-10-CM | POA: Diagnosis not present

## 2021-05-25 DIAGNOSIS — I129 Hypertensive chronic kidney disease with stage 1 through stage 4 chronic kidney disease, or unspecified chronic kidney disease: Secondary | ICD-10-CM | POA: Diagnosis not present

## 2021-05-25 DIAGNOSIS — D252 Subserosal leiomyoma of uterus: Secondary | ICD-10-CM | POA: Diagnosis not present

## 2021-05-25 DIAGNOSIS — Z3201 Encounter for pregnancy test, result positive: Secondary | ICD-10-CM | POA: Diagnosis not present

## 2021-05-25 DIAGNOSIS — O3411 Maternal care for benign tumor of corpus uteri, first trimester: Secondary | ICD-10-CM | POA: Diagnosis not present

## 2021-05-25 DIAGNOSIS — D259 Leiomyoma of uterus, unspecified: Secondary | ICD-10-CM

## 2021-05-25 DIAGNOSIS — D509 Iron deficiency anemia, unspecified: Secondary | ICD-10-CM | POA: Diagnosis not present

## 2021-05-25 DIAGNOSIS — R0602 Shortness of breath: Secondary | ICD-10-CM | POA: Diagnosis not present

## 2021-05-25 DIAGNOSIS — Z79899 Other long term (current) drug therapy: Secondary | ICD-10-CM | POA: Diagnosis not present

## 2021-05-25 DIAGNOSIS — N1831 Chronic kidney disease, stage 3a: Secondary | ICD-10-CM

## 2021-05-25 DIAGNOSIS — K760 Fatty (change of) liver, not elsewhere classified: Secondary | ICD-10-CM | POA: Diagnosis not present

## 2021-05-25 DIAGNOSIS — R109 Unspecified abdominal pain: Secondary | ICD-10-CM | POA: Diagnosis not present

## 2021-05-25 DIAGNOSIS — K0889 Other specified disorders of teeth and supporting structures: Secondary | ICD-10-CM | POA: Diagnosis not present

## 2021-05-25 DIAGNOSIS — N83201 Unspecified ovarian cyst, right side: Secondary | ICD-10-CM | POA: Diagnosis not present

## 2021-05-25 DIAGNOSIS — K5900 Constipation, unspecified: Secondary | ICD-10-CM | POA: Diagnosis not present

## 2021-05-25 DIAGNOSIS — R102 Pelvic and perineal pain: Secondary | ICD-10-CM | POA: Diagnosis not present

## 2021-05-25 DIAGNOSIS — Z8249 Family history of ischemic heart disease and other diseases of the circulatory system: Secondary | ICD-10-CM | POA: Diagnosis not present

## 2021-05-25 DIAGNOSIS — D500 Iron deficiency anemia secondary to blood loss (chronic): Secondary | ICD-10-CM | POA: Diagnosis not present

## 2021-05-25 DIAGNOSIS — O161 Unspecified maternal hypertension, first trimester: Secondary | ICD-10-CM | POA: Diagnosis not present

## 2021-05-25 DIAGNOSIS — R7989 Other specified abnormal findings of blood chemistry: Secondary | ICD-10-CM | POA: Diagnosis not present

## 2021-05-25 DIAGNOSIS — M109 Gout, unspecified: Secondary | ICD-10-CM | POA: Diagnosis not present

## 2021-05-25 DIAGNOSIS — O26831 Pregnancy related renal disease, first trimester: Secondary | ICD-10-CM | POA: Diagnosis not present

## 2021-05-25 DIAGNOSIS — E871 Hypo-osmolality and hyponatremia: Secondary | ICD-10-CM | POA: Diagnosis not present

## 2021-05-25 DIAGNOSIS — N179 Acute kidney failure, unspecified: Secondary | ICD-10-CM | POA: Diagnosis not present

## 2021-05-25 DIAGNOSIS — Z20822 Contact with and (suspected) exposure to covid-19: Secondary | ICD-10-CM | POA: Diagnosis not present

## 2021-05-25 DIAGNOSIS — I2510 Atherosclerotic heart disease of native coronary artery without angina pectoris: Secondary | ICD-10-CM | POA: Diagnosis not present

## 2021-05-25 DIAGNOSIS — Z720 Tobacco use: Secondary | ICD-10-CM

## 2021-05-25 DIAGNOSIS — Z7982 Long term (current) use of aspirin: Secondary | ICD-10-CM | POA: Diagnosis not present

## 2021-05-25 DIAGNOSIS — D649 Anemia, unspecified: Secondary | ICD-10-CM | POA: Diagnosis not present

## 2021-05-25 DIAGNOSIS — O26899 Other specified pregnancy related conditions, unspecified trimester: Secondary | ICD-10-CM | POA: Diagnosis not present

## 2021-05-25 DIAGNOSIS — Z3A00 Weeks of gestation of pregnancy not specified: Secondary | ICD-10-CM | POA: Diagnosis not present

## 2021-05-25 DIAGNOSIS — N920 Excessive and frequent menstruation with regular cycle: Secondary | ICD-10-CM | POA: Diagnosis not present

## 2021-05-25 DIAGNOSIS — O99011 Anemia complicating pregnancy, first trimester: Secondary | ICD-10-CM | POA: Diagnosis not present

## 2021-05-25 DIAGNOSIS — F17210 Nicotine dependence, cigarettes, uncomplicated: Secondary | ICD-10-CM | POA: Diagnosis not present

## 2021-05-25 DIAGNOSIS — I517 Cardiomegaly: Secondary | ICD-10-CM | POA: Diagnosis not present

## 2021-05-25 LAB — BASIC METABOLIC PANEL
Anion gap: 9 (ref 5–15)
BUN: 19 mg/dL (ref 6–20)
CO2: 21 mmol/L — ABNORMAL LOW (ref 22–32)
Calcium: 8.4 mg/dL — ABNORMAL LOW (ref 8.9–10.3)
Chloride: 102 mmol/L (ref 98–111)
Creatinine, Ser: 1.45 mg/dL — ABNORMAL HIGH (ref 0.44–1.00)
GFR, Estimated: 46 mL/min — ABNORMAL LOW (ref >60)
Glucose, Bld: 96 mg/dL (ref 70–99)
Potassium: 4 mmol/L (ref 3.5–5.1)
Sodium: 132 mmol/L — ABNORMAL LOW (ref 135–145)

## 2021-05-25 LAB — TYPE AND SCREEN
ABO/RH(D): O POS
Antibody Screen: NEGATIVE
Unit division: 0
Unit division: 0

## 2021-05-25 LAB — BPAM RBC
Blood Product Expiration Date: 202302222359
Blood Product Expiration Date: 202302272359
ISSUE DATE / TIME: 202302031811
ISSUE DATE / TIME: 202302032327
Unit Type and Rh: 5100
Unit Type and Rh: 5100

## 2021-05-25 LAB — CBC
HCT: 25.5 % — ABNORMAL LOW (ref 36.0–46.0)
Hemoglobin: 7.4 g/dL — ABNORMAL LOW (ref 12.0–15.0)
MCH: 21 pg — ABNORMAL LOW (ref 26.0–34.0)
MCHC: 29 g/dL — ABNORMAL LOW (ref 30.0–36.0)
MCV: 72.4 fL — ABNORMAL LOW (ref 80.0–100.0)
Platelets: 293 10*3/uL (ref 150–400)
RBC: 3.52 MIL/uL — ABNORMAL LOW (ref 3.87–5.11)
RDW: 18.6 % — ABNORMAL HIGH (ref 11.5–15.5)
WBC: 14.9 10*3/uL — ABNORMAL HIGH (ref 4.0–10.5)
nRBC: 0 % (ref 0.0–0.2)

## 2021-05-25 LAB — RETIC PANEL
Immature Retic Fract: 37.9 % — ABNORMAL HIGH (ref 2.3–15.9)
RBC.: 3.54 MIL/uL — ABNORMAL LOW (ref 3.87–5.11)
Retic Count, Absolute: 47.4 10*3/uL (ref 19.0–186.0)
Retic Ct Pct: 1.3 % (ref 0.4–3.1)
Reticulocyte Hemoglobin: 20 pg — ABNORMAL LOW (ref >27.9)

## 2021-05-25 LAB — HCG, QUANTITATIVE, PREGNANCY: hCG, Beta Chain, Quant, S: 361 m[IU]/mL — ABNORMAL HIGH (ref <5)

## 2021-05-25 LAB — HIV ANTIBODY (ROUTINE TESTING W REFLEX): HIV Screen 4th Generation wRfx: NONREACTIVE

## 2021-05-25 MED ORDER — ALUM & MAG HYDROXIDE-SIMETH 200-200-20 MG/5ML PO SUSP
30.0000 mL | ORAL | Status: DC | PRN
  Administered 2021-05-25: 30 mL via ORAL
  Filled 2021-05-25: qty 30

## 2021-05-25 MED ORDER — SODIUM CHLORIDE 0.9 % IV SOLN
250.0000 mg | Freq: Every day | INTRAVENOUS | Status: AC
  Administered 2021-05-25 – 2021-05-28 (×4): 250 mg via INTRAVENOUS
  Filled 2021-05-25 (×5): qty 20

## 2021-05-25 NOTE — ED Notes (Signed)
Attempted to call report to [phone]

## 2021-05-25 NOTE — ED Notes (Signed)
Med-Surg Breakfast Ordered

## 2021-05-25 NOTE — Progress Notes (Signed)
-   Beta rose from 330 to 360 - too soon to be able to interpret in terms of viability but cannot say non-viable since it rose.  - Ordered recheck beta tomorrow - Continue pain management as needed and as recommended in my initial note.  - We will follow up beta tomorrow  Radene Gunning, MD Attending [place], [facility] for [facility], [place]

## 2021-05-25 NOTE — Consult Note (Addendum)
° °  OB/GYN Telephone Consult  05/25/21   Jeanette Gamble is a 41 y.o. G1P0101 currently at Unknown weeks presenting to ED with pain in setting of fibroids and positive HCG of 330. Pt reported period one week ago and was heavier and more painful than usual and is not on contraception.    I was called for a consult regarding the care of this patient by [facility] [place] provider had a clinical question about pain control in setting of pregnancy and if any intervention regarding her fibroids.   The provider presented the following relevant clinical information: The primary issue at this time is her pain.   I performed a chart review on the patient and reviewed available documentation.  BP 128/90 (BP Location: Left Arm)    Pulse 61    Temp 97.8 F (36.6 C) (Axillary)    Resp 18    SpO2 100%   Exam- performed by consulting provider   Recommendations:  - It is possible that her beta is a continued downward trend from an early miscarriage based on her description of vaginal bleeding. Beta is low enough that there being no IUP/no EUP is to be expected. While not the traditional 48 hours, if her beta is dropping in this clinical setting, this would be consistent with a non-viable pregnancy but of unknown location and we would then advise having it followed down to 0. If rising, then we will need to trend Q48 hours thereafter until high enough to establish location of pregnancy (typically >4000).  - Her pain is either due to a degenerating fibroid vs the hemorrhagic cyst. Regardless of source of pain, surgical intervention would not be advised acutely in this setting unless beta was dropping and pain control was not able to be achieved in the out patient setting.  - For pain control at this early of a gestation, any form of pain control would be appropriate I.e. ibuprofen, tylenol and narcotic based medication - I will recheck follow along peripherally for this result today  and will put in any additional recommendations pending the result - the team depending on time of day will vary and we are best reached by the number below.    Thank you for this consult and if additional recommendations are needed please call [phone] for the OB/GYN attending on service at [facility].   I spent approximately 5 minutes directly consulting with the provider and verbally discussing this case. Additionally 15 minutes minutes was spent performing chart review and documentation.   Radene Gunning, MD   Criteria for phone consult billing? (If answer to any of these are yes then you cannot bill this telephone consult) Will the patient be seen urgently (within 24hrs) at a [facility] practice? No Is this a patient on which I performed surgery within the last 7d? No Have you billed a telephone consult on this patient in the last 7d? No

## 2021-05-25 NOTE — ED Notes (Signed)
Blood draw not completed, able to collect @3 :47 after medication finishes.

## 2021-05-25 NOTE — Progress Notes (Signed)
HD#0 Subjective:  Overnight Events: Admitted  Patient lying in bed, appears uncomfortable.  She states that she is continue to have lower abdominal pain.  She notes that last week she had heavy vaginal bleeding and was the day earlier than her usual menstrual period.  She denies being on contraceptives and is sexually active without barrier protection.  She also endorses migraine, she has a history of migraines that are not well controlled with medications.  She denies any dysuria but does have some lower abdominal pain with urinating.  She denies following with OB/GYN.  She follows with [facility] in our clinic.  Discussed with patient we are awaiting OB/GYN consultation.  She no further questions at this time. Objective:  Vital signs in last 24 hours: Vitals:   05/25/21 0700 05/25/21 0715 05/25/21 0752 05/25/21 0812  BP: 122/87 122/87 118/87 128/90  Pulse: 82 67 94 61  Resp: (!) 22 17 20 18   Temp:   98.6 F (37 C) 97.8 F (36.6 C)  TempSrc:    Axillary  SpO2: 99% 99% 100% 100%   Supplemental O2: Room Air SpO2: 100 %   Physical Exam:  Constitutional: Mild distress and discomfort HENT: normocephalic atraumatic Eyes: conjunctiva non-erythematous Neck: supple Cardiovascular: regular rate and rhythm, no m/r/g Pulmonary/Chest: normal work of breathing on room air, lungs clear to auscultation bilaterally Abdominal: Tender to palpation in the left and right lower quadrant region.  Palpable mass right lower quadrant.  When palpating upper quadrants she does endorse pain in the lower quadrant area. MSK: normal bulk and tone Neurological: Alert, no neurological deficits appreciated Skin: warm and dry Psych: Mood and thought process  There were no vitals filed for this visit.   Intake/Output Summary (Last 24 hours) at 05/25/2021 1035 Last data filed at 05/25/2021 0326 Gross per 24 hour  Intake 2260 ml  Output --  Net 2260 ml   Net IO Since Admission: 2,260 mL [05/25/21  1035]  Pertinent Labs: CBC Latest Ref Rng & Units 05/25/2021 05/24/2021 05/22/2021  WBC 4.0 - 10.5 K/uL 14.9(H) 14.2(H) 14.6(H)  Hemoglobin 12.0 - 15.0 g/dL 7.4(L) 6.1(LL) 6.5(LL)  Hematocrit 36.0 - 46.0 % 25.5(L) 22.6(L) 22.4(L)  Platelets 150 - 400 K/uL 293 321 326    CMP Latest Ref Rng & Units 05/25/2021 05/24/2021 01/20/2020  Glucose 70 - 99 mg/dL 96 107(H) 106(H)  BUN 6 - 20 mg/dL 19 18 11   Creatinine 0.44 - 1.00 mg/dL 1.45(H) 1.48(H) 1.13(H)  Sodium 135 - 145 mmol/L 132(L) 134(L) 137  Potassium 3.5 - 5.1 mmol/L 4.0 3.9 3.6  Chloride 98 - 111 mmol/L 102 103 104  CO2 22 - 32 mmol/L 21(L) 22 23  Calcium 8.9 - 10.3 mg/dL 8.4(L) 8.9 8.5(L)  Total Protein 6.5 - 8.1 g/dL - 7.4 -  Total Bilirubin 0.3 - 1.2 mg/dL - 0.8 -  Alkaline Phos 38 - 126 U/L - 55 -  AST 15 - 41 U/L - 11(L) -  ALT 0 - 44 U/L - 11 -    Imaging: US OB Comp < 14 Wks  Result Date: 05/24/2021 CLINICAL DATA:  Pelvic pain. EXAM: OBSTETRIC <14 WK [place] AND TRANSVAGINAL OB US TECHNIQUE: Both transabdominal and transvaginal ultrasound examinations were performed for complete evaluation of the gestation as well as the maternal uterus, adnexal regions, and pelvic cul-de-sac. Transvaginal technique was performed to assess early pregnancy. COMPARISON:  03/24/2019 FINDINGS: Intrauterine gestational sac: None identified Maternal uterus/adnexae: The uterus is anteverted. The uterus measures 16.5 x 11.8 x  13.3 cm. Uterine volume is 1352 cc. The myometrial echotexture is diffusely coarsened in keeping with uterine fibroids. The dominant fibroid is seen within the anterior fundus measuring 8.9 x 8.8 x 0.8 cm. And exophytic fibroid is seen arising from the left fundus measuring 4.3 x 4.0 x 5.4 cm. The endometrial stripe is poorly visualized, but measures roughly 8 mm in thickness. The right ovary measures 5.7 x 4.0 x 4.9 cm and contains a 4.4 cm complex cystic lesion demonstrating diffuse internal echoes most in keeping with a hemorrhagic cyst or, less  likely, an endometrioma. A trace amount of free fluid is seen within the right adnexa. The left ovary is not visualized. IMPRESSION: No intrauterine gestational sac identified. Multi fibroid uterus. 4.4 cm right adnexal cyst most in keeping with a hemorrhagic cyst. Electronically Signed   By: Fidela Salisbury M.D.   On: 05/24/2021 21:54   US OB Transvaginal  Result Date: 05/24/2021 CLINICAL DATA:  Pain EXAM: TRANSVAGINAL OB ULTRASOUND TECHNIQUE: Transvaginal ultrasound was performed for complete evaluation of the gestation as well as the maternal uterus, adnexal regions, and pelvic cul-de-sac. COMPARISON:  None. FINDINGS: Intrauterine gestational sac: None Yolk sac:  Not Visualized. Embryo:  Not Visualized. Cardiac Activity: Not Visualized. Heart Rate:  bpm MSD:   mm    w     d CRL:     mm    w  d                  [place] EDC: Subchorionic hemorrhage:  None visualized. Maternal uterus/adnexae: Enlarged fibroid uterus. 9 cm central fibroid. Exophytic anterior left fundal fibroid measures up to 5.4 cm. Endometrium difficult to visualize due to central fibroid. 4.4 cm hemorrhagic cyst in the right ovary. Trace free fluid in the pelvis. IMPRESSION: No intrauterine pregnancy visualized. Differential considerations would include early intrauterine pregnancy too early to visualize, spontaneous abortion, or occult ectopic pregnancy. Recommend close clinical followup and serial quantitative beta HCGs and ultrasounds. Fibroid uterus.  Large central fibroid measures up to 9 cm. 4.4 cm right ovarian hemorrhagic cyst. Electronically Signed   By: Rolm Baptise M.D.   On: 05/24/2021 21:46   CT ABDOMEN PELVIS W CONTRAST  Result Date: 05/24/2021 CLINICAL DATA:  A 41 year old female presents with LEFT lower quadrant pain. EXAM: CT ABDOMEN AND PELVIS WITH CONTRAST TECHNIQUE: Multidetector CT imaging of the abdomen and pelvis was performed using the standard protocol following bolus administration of intravenous contrast. RADIATION DOSE  REDUCTION: This exam was performed according to the departmental dose-optimization program which includes automated exposure control, adjustment of the mA and/or kV according to patient size and/or use of iterative reconstruction technique. CONTRAST:  195mL OMNIPAQUE IOHEXOL 300 MG/ML  SOLN COMPARISON:  Comparison made with March 01/09/2020. FINDINGS: Lower chest: Incidental imaging of the lung bases is unremarkable, no effusion or sign of consolidation. Hepatobiliary: No focal, suspicious hepatic lesion. No pericholecystic stranding. No biliary duct dilation. Portal vein is patent. Hepatic steatosis. Pancreas: Normal, without mass, inflammation or ductal dilatation. Spleen: Normal. Adrenals/Urinary Tract: Adrenal glands are unremarkable. Symmetric renal enhancement. No sign of hydronephrosis. No suspicious renal lesion or perinephric stranding. Urinary bladder is grossly unremarkable. Stomach/Bowel: Appendix is normal. No perigastric stranding. No sign of small bowel dilation to suggest acute small bowel process. No signs of inflammation adjacent to the colon or small bowel. Stool in the colon. Vascular/Lymphatic: Aorta with smooth contours. IVC with smooth contours. No aneurysmal dilation of the abdominal aorta. There is no gastrohepatic or hepatoduodenal ligament  lymphadenopathy. No retroperitoneal or mesenteric lymphadenopathy. No pelvic sidewall lymphadenopathy. Reproductive: Multiple leiomyomata in the uterus, 3 large leiomyomata in particular, the first a subserosal leiomyoma that extends cephalad from the uterine fundus measuring 7.9 x 7.4 cm greatest axial dimension. The second a subserosal leiomyoma that extends from the uterine fundus towards the LEFT adnexa measuring 8.0 x 6.8 cm. The third large leiomyoma that was seen previously in the mid uterus and previously measured approximately 7.4 x 7.4 cm now measures 8.6 x 8.1 cm in shows diffuse low attenuation relative to other leiomyomas that are seen.  Mildly irregular margins of this leiomyoma are noted along the cephalad aspect, unclear whether this is related to the adjacent endometrium which may be the case though this is not clear (image 59/3) this area measuring 2.6 x 1.9 cm. RIGHT ovarian cyst measuring 5.1 x 3.5 cm appears thin walled and previously measured approximately 3.2 cm. Probable corpus luteum in the LEFT ovary. LEFT ovary grossly unchanged otherwise compared to previous imaging. Other: No ascites.  No pneumoperitoneum. Musculoskeletal: No acute or significant osseous findings. IMPRESSION: Large leiomyoma in the mid uterus anteriorly with considerable enlargement now with little internal enhancement as compared to previous imaging and as compared to adjacent leiomyomata. This likely represents a large degenerating leiomyoma and may explain the patient's symptoms. Gyn consultation may be helpful. Irregularity along the RIGHT lateral aspect of leiomyoma may represent another smaller degenerating fibroid or "trapped" endometrium in the RIGHT uterine fundus due to marked fibroid enlargement. Would suggest follow-up to ensure that this does not represent invasive border as invasive border can be associated with malignant transformation. Short interval follow-up could be considered either with pelvic sonogram initially or MRI as warranted. Greater than 5 cm cyst associated with the RIGHT ovary. Follow-up by [place] is recommended in 3-6 months or sooner if symptoms worsen or localized to this area. Note: This recommendation does not apply to premenarchal patients and to those with increased risk (genetic, family history, elevated tumor markers or other high-risk factors) of ovarian cancer. Reference: JACR 2020 Feb; 17(2):248-254 Electronically Signed   By: Zetta Bills M.D.   On: 05/24/2021 17:26   DG Chest Portable 1 View  Result Date: 05/24/2021 CLINICAL DATA:  Shortness of breath. EXAM: PORTABLE CHEST 1 VIEW COMPARISON:  January 17, 2020. FINDINGS:  Stable cardiomegaly. Both lungs are clear. The visualized skeletal structures are unremarkable. IMPRESSION: No active disease. Electronically Signed   By: Marijo Conception M.D.   On: 05/24/2021 16:09    Assessment/Plan:   Principal Problem:   Symptomatic anemia Active Problems:   Essential hypertension   Tobacco use disorder   Stage 3a chronic kidney disease (HCC)   Uterine fibroid   Iron deficiency anemia   Patient Summary: JANANI CHAMBER is a 41 y.o. with a pertinent PMH of hypertension, migraine headaches, tobacco use CKD stage IIIa iron deficiency anemia gout and uterine fibroid, who presented with lower abdominal pain dizziness presyncope and admitted for further work-up and evaluation of abdominal pain as well as hemoglobin 6.5.   Multiple large uterine fibroids Patient with persistent lower abdominal pain and found to have multiple large uterine fibroids on imaging.  OB/GYN consulted today who recommend conservative management of pain and awaiting results of beta-hCG to determine if patient is pregnant or not.  Do not believe surgical intervention is needed at this time unless patient fails outpatient conservative management.  I do suspect her pain is coming from her uterine fibroids unable to rule  out PID or STI.  We will order GC chlamydia trichomoniasis BV vaginal swab.  We will also plan on performing pelvic examination during hospitalization. -Appreciate Dr. [name] and her assistance in this patient's care -Pain management with Tylenol and oxycodone for breakthrough pain. -Continue to trend beta-hCG -STI screening pending, pelvic examination pending  Elevated beta-hCG Patient is sexually active does not use contraceptives of any kind or barrier protection.  Possibility that she is pregnant or had recent abortion.  Patient does note heavy menstrual bleeding a week ago that was a day earlier than her usual menstrual cycle.  We will follow-up beta-hCG and follow OB/GYN's  recommendations. -Follow-up OB/GYN recommendations  Microcytic anemia Hemoglobin increased from 7.4 after 2 units PPR C's, MCV of 72.4.  Outpatient iron studies 3 days ago iron of 13 and iron sat of 4, ferritin of 24.  We will continue to trend CBC and give IV iron supplementation during admission.  FOBT negative.  Suspect that her bleeding is from her fibroids however with dark tarry stools recently documented we will continue to monitor for recurrence of this.  If GI sources suspected, will consider consultation and possible colonoscopy.  -Daily CBC -Monitor for GI bleed -IV ferrous gluconate ordered 250 mg x 4  Migraine Patient's migraine is typical of her usual migraines.  We will continue with Tylenol 1000 mg 3 times daily and oxycodone every 4 hours as needed.  If no improvement consider additional medications.  CKD stage IIIa Creatinine baseline seems to range from 1.0-1.4.  Currently stable at 1.4.  We will continue to follow and trend. -Daily BMP -Avoid nephrotoxic medications -Initiate work-up for AKI if patient's creatinine continues to trend upwards  Tobacco use Continue nicotine patch 7 mg transdermal every 24 hours  Diet: Normal IVF: None,None VTE: SCDs Code: Full PT/OT recs: None, none. TOC recs: None  Dispo: Anticipated discharge to home in 1-2 days pending further work-up and evaluation of her abdominal pain and anemia.   Sanjuana Letters DO Internal Medicine Resident PGY-2 Pager [phone] Please contact the on call pager after 5 pm and on weekends at [phone].

## 2021-05-26 DIAGNOSIS — R7989 Other specified abnormal findings of blood chemistry: Secondary | ICD-10-CM

## 2021-05-26 DIAGNOSIS — D259 Leiomyoma of uterus, unspecified: Secondary | ICD-10-CM | POA: Diagnosis not present

## 2021-05-26 DIAGNOSIS — G43909 Migraine, unspecified, not intractable, without status migrainosus: Secondary | ICD-10-CM | POA: Diagnosis not present

## 2021-05-26 DIAGNOSIS — R109 Unspecified abdominal pain: Secondary | ICD-10-CM | POA: Diagnosis not present

## 2021-05-26 DIAGNOSIS — N1831 Chronic kidney disease, stage 3a: Secondary | ICD-10-CM | POA: Diagnosis not present

## 2021-05-26 DIAGNOSIS — Z720 Tobacco use: Secondary | ICD-10-CM | POA: Diagnosis not present

## 2021-05-26 DIAGNOSIS — Z3201 Encounter for pregnancy test, result positive: Secondary | ICD-10-CM | POA: Diagnosis not present

## 2021-05-26 DIAGNOSIS — I129 Hypertensive chronic kidney disease with stage 1 through stage 4 chronic kidney disease, or unspecified chronic kidney disease: Secondary | ICD-10-CM | POA: Diagnosis not present

## 2021-05-26 DIAGNOSIS — D649 Anemia, unspecified: Secondary | ICD-10-CM | POA: Diagnosis not present

## 2021-05-26 DIAGNOSIS — K5900 Constipation, unspecified: Secondary | ICD-10-CM | POA: Diagnosis not present

## 2021-05-26 DIAGNOSIS — K219 Gastro-esophageal reflux disease without esophagitis: Secondary | ICD-10-CM | POA: Diagnosis not present

## 2021-05-26 DIAGNOSIS — D509 Iron deficiency anemia, unspecified: Secondary | ICD-10-CM | POA: Diagnosis not present

## 2021-05-26 DIAGNOSIS — F17210 Nicotine dependence, cigarettes, uncomplicated: Secondary | ICD-10-CM | POA: Diagnosis not present

## 2021-05-26 LAB — CBC WITH DIFFERENTIAL/PLATELET
Abs Immature Granulocytes: 0.1 10*3/uL — ABNORMAL HIGH (ref 0.00–0.07)
Basophils Absolute: 0.1 10*3/uL (ref 0.0–0.1)
Basophils Relative: 0 %
Eosinophils Absolute: 0.2 10*3/uL (ref 0.0–0.5)
Eosinophils Relative: 2 %
HCT: 24.6 % — ABNORMAL LOW (ref 36.0–46.0)
Hemoglobin: 7.2 g/dL — ABNORMAL LOW (ref 12.0–15.0)
Immature Granulocytes: 1 %
Lymphocytes Relative: 10 %
Lymphs Abs: 1.4 10*3/uL (ref 0.7–4.0)
MCH: 21 pg — ABNORMAL LOW (ref 26.0–34.0)
MCHC: 29.3 g/dL — ABNORMAL LOW (ref 30.0–36.0)
MCV: 71.7 fL — ABNORMAL LOW (ref 80.0–100.0)
Monocytes Absolute: 0.8 10*3/uL (ref 0.1–1.0)
Monocytes Relative: 5 %
Neutro Abs: 11.6 10*3/uL — ABNORMAL HIGH (ref 1.7–7.7)
Neutrophils Relative %: 82 %
Platelets: 304 10*3/uL (ref 150–400)
RBC: 3.43 MIL/uL — ABNORMAL LOW (ref 3.87–5.11)
RDW: 18.5 % — ABNORMAL HIGH (ref 11.5–15.5)
WBC: 14.1 10*3/uL — ABNORMAL HIGH (ref 4.0–10.5)
nRBC: 0 % (ref 0.0–0.2)

## 2021-05-26 LAB — BASIC METABOLIC PANEL
Anion gap: 10 (ref 5–15)
BUN: 22 mg/dL — ABNORMAL HIGH (ref 6–20)
CO2: 21 mmol/L — ABNORMAL LOW (ref 22–32)
Calcium: 8.6 mg/dL — ABNORMAL LOW (ref 8.9–10.3)
Chloride: 103 mmol/L (ref 98–111)
Creatinine, Ser: 1.39 mg/dL — ABNORMAL HIGH (ref 0.44–1.00)
GFR, Estimated: 49 mL/min — ABNORMAL LOW (ref >60)
Glucose, Bld: 120 mg/dL — ABNORMAL HIGH (ref 70–99)
Potassium: 3.6 mmol/L (ref 3.5–5.1)
Sodium: 134 mmol/L — ABNORMAL LOW (ref 135–145)

## 2021-05-26 LAB — HCG, QUANTITATIVE, PREGNANCY: hCG, Beta Chain, Quant, S: 466 m[IU]/mL — ABNORMAL HIGH (ref <5)

## 2021-05-26 MED ORDER — PANTOPRAZOLE SODIUM 40 MG PO TBEC
40.0000 mg | DELAYED_RELEASE_TABLET | Freq: Two times a day (BID) | ORAL | Status: DC
  Administered 2021-05-26 – 2021-05-28 (×4): 40 mg via ORAL
  Filled 2021-05-26 (×4): qty 1

## 2021-05-26 MED ORDER — LABETALOL HCL 5 MG/ML IV SOLN: 10.0000 mg | INTRAVENOUS | Status: DC | PRN

## 2021-05-26 MED ORDER — SODIUM CHLORIDE 0.9 % IV SOLN
2.0000 g | Freq: Four times a day (QID) | INTRAVENOUS | Status: DC
  Administered 2021-05-26 – 2021-05-28 (×8): 2 g via INTRAVENOUS
  Filled 2021-05-26 (×11): qty 2

## 2021-05-26 MED ORDER — AZITHROMYCIN 250 MG PO TABS
1000.0000 mg | ORAL_TABLET | Freq: Once | ORAL | Status: AC
  Administered 2021-05-26: 1000 mg via ORAL
  Filled 2021-05-26: qty 4

## 2021-05-26 NOTE — Progress Notes (Signed)
Spoke with IM team - agree with plan of treatment for PID at this point empirically. Cultures have been collected but expect to be resulted tomorrow. Orders placed for Cefoxitin and Azithromycin. It is also possible this is due to degenerating fibroid.   Beta continues to rise. Went from 330 to 466 - not a doubling but does meet ~50% rise. Would recheck beta again in 2 days. At this point too soon to visualize anything by [place] so this is expected. TVUS on 2/3 saw no IUP/EUP as expected.   We will round on patient tomorrow after ABX on board, cultures results and review with patient expectations for possible pregnancy with unknown location and unknown viability.   Radene Gunning, MD Attending [place], [facility] for [facility], [place]

## 2021-05-26 NOTE — Progress Notes (Signed)
Subjective: I seen and evaluated Jeanette Gamble at bedside. She states she is still experiencing abdominal pain localized to the lower abdomin and lower back, 8/10 intensity. She denies hematuria, melenic stool, or vaginal bleeding since admission. She endorse headache which is relieved by tylenol. She denies fever, chills, or vomiting. However, pt is nausea but is able to drink and eat adequately.  Objective:  Vital signs in last 24 hours: Vitals:   05/25/21 2005 05/26/21 0117 05/26/21 0425 05/26/21 0733  BP: (!) 143/98 (!) 149/90 (!) 143/102 (!) 137/92  Pulse: 71 67 69 83  Resp: 18 18 17 18   Temp: 97.7 F (36.5 C) 97.8 F (36.6 C) 97.6 F (36.4 C) 98.4 F (36.9 C)  TempSrc: Oral Oral Oral Oral  SpO2: 99% 100% 100% 98%   Physical Exam Constitutional:      General: She is awake. She is not in acute distress. HENT:     Head: Normocephalic and atraumatic.  Cardiovascular:     Rate and Rhythm: Normal rate and regular rhythm.     Heart sounds: Normal heart sounds.  Pulmonary:     Effort: Pulmonary effort is normal.     Breath sounds: Normal breath sounds.  Abdominal:     General: Abdomen is protuberant.     Palpations: There is mass.     Tenderness: There is abdominal tenderness in the right lower quadrant, suprapubic area and left lower quadrant.     Comments: Nodular abdomin upon palpation   Genitourinary:    Labia:        Right: No lesion.        Left: No lesion.      Cervix: Discharge present.     Adnexa:        Right: Tenderness present.      Comments: Mucopurulent discharge noted.  Difficulty visualizing cervical os.  Cervical motion tenderness noted on the right adnexa Musculoskeletal:     Right lower leg: No edema.     Left lower leg: No edema.  Neurological:     General: No focal deficit present.     Mental Status: She is alert.  Psychiatric:        Behavior: Behavior normal. Behavior is cooperative.     Assessment/Plan:  Principal Problem:   Symptomatic  anemia Active Problems:   Essential hypertension   Tobacco use disorder   Stage 3a chronic kidney disease (HCC)   Uterine fibroid   Iron deficiency anemia  Jeanette Gamble is a 41 y.o. with a pertinent PMH of hypertension, migraine headaches, tobacco use CKD stage IIIa iron deficiency anemia gout and uterine fibroid, who presented with lower abdominal pain dizziness presyncope and admitted for further work-up and evaluation of abdominal pain as well as hemoglobin 6.5.   Uterine fibroids Patient with persistent lower abdominal pain and found to have multiple large uterine fibroids on imaging. OB/GYN consulted today who recommend conservative management of pain.  Patient continues to have severe abdominal pain, questionable for PID or STI.  Patient denies vaginal bleeding at this time. Cervicovaginal ancillary swab pending.  Pelvic exam obtained at bedside, difficult to visualize cervical os, mucopurulent discharge cervix noted. Cervical motion tenderness positive.  Patient denies fevers or chills.  Patient denies dyspareunia or vaginitis. -- Pain control with Tylenol 1000mg  TID and oxycodone 5 mg every 6 as needed --STI screening pending --OB/GYN following  Elevated beta hCG Beta-hCG trending upward 330-->360-->466.  Patient is sexually active and does not use any form of contraception  or barrier protection.  There is possibility of pregnancy though ultrasound did not show any conception, however, could be too early to visualize.  Patient however endorsed 1 day of heavy bleeding 1 week ago, could possibly be due to fibroids or spontaneous abortion.  We will continue to trend beta hCG levels.. --Repeat beta-hCG --OB/GYN following; recommendations appreciated  Iron deficiency anemia Since admission patient has received 2 units of RBCs.  Current hemoglobin is 7.2 down from 7.4.  He has not responded appropriately to transfusion.  Though, source of bleeding is unclear.  FOBT negative.  Patient  endorses bowel movements, brown stool; denies hematochezia or melenic stool.  No vaginal bleeding or hematuria noted. --Trend CBC --Monitor for GI bleed --IV Ferrlecit gluconate 250mg  x 4; last dose 05/29/2021  Migraine Patient complained of migraine headache this morning.  Similar to episode she has had in the past.  Patient states that Tylenol relieves the pain. --Tylenol 1000 mg 3 times daily --Monitor for changes in intensity or frequency  CKD stage IIIa Creatinine baseline range 1.1-1.3.  Creatinine levels continue to improve currently at 1.39 down from 1.45.  Patient is able to eat and drink appropriately.  Patient denies any difficulty urinating. --Trend BMP --Avoid nephrotoxic agents --Encourage fluid intake  Tobacco use Continue nicotine patch 7 mg transdermal every 24 hours  Prior to Admission Living Arrangement: Anticipated Discharge Location: Barriers to Discharge: Dispo: Anticipated discharge in approximately 1-2 day(s).   Timothy Lasso, MD 05/26/2021, 9:12 AM Pager: [phone] After 5pm on weekdays and 1pm on weekends: On Call pager [phone]
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
## 2021-05-27 DIAGNOSIS — D649 Anemia, unspecified: Secondary | ICD-10-CM | POA: Diagnosis not present

## 2021-05-27 DIAGNOSIS — Z3201 Encounter for pregnancy test, result positive: Secondary | ICD-10-CM | POA: Diagnosis not present

## 2021-05-27 DIAGNOSIS — D509 Iron deficiency anemia, unspecified: Secondary | ICD-10-CM | POA: Diagnosis not present

## 2021-05-27 DIAGNOSIS — D259 Leiomyoma of uterus, unspecified: Secondary | ICD-10-CM | POA: Diagnosis not present

## 2021-05-27 DIAGNOSIS — D500 Iron deficiency anemia secondary to blood loss (chronic): Secondary | ICD-10-CM | POA: Diagnosis not present

## 2021-05-27 LAB — CBC WITH DIFFERENTIAL/PLATELET
Abs Immature Granulocytes: 0.13 10*3/uL — ABNORMAL HIGH (ref 0.00–0.07)
Basophils Absolute: 0 10*3/uL (ref 0.0–0.1)
Basophils Relative: 0 %
Eosinophils Absolute: 0.2 10*3/uL (ref 0.0–0.5)
Eosinophils Relative: 2 %
HCT: 24.3 % — ABNORMAL LOW (ref 36.0–46.0)
Hemoglobin: 7.1 g/dL — ABNORMAL LOW (ref 12.0–15.0)
Immature Granulocytes: 1 %
Lymphocytes Relative: 10 %
Lymphs Abs: 1.4 10*3/uL (ref 0.7–4.0)
MCH: 21.6 pg — ABNORMAL LOW (ref 26.0–34.0)
MCHC: 29.2 g/dL — ABNORMAL LOW (ref 30.0–36.0)
MCV: 73.9 fL — ABNORMAL LOW (ref 80.0–100.0)
Monocytes Absolute: 0.8 10*3/uL (ref 0.1–1.0)
Monocytes Relative: 6 %
Neutro Abs: 10.7 10*3/uL — ABNORMAL HIGH (ref 1.7–7.7)
Neutrophils Relative %: 81 %
Platelets: 309 10*3/uL (ref 150–400)
RBC: 3.29 MIL/uL — ABNORMAL LOW (ref 3.87–5.11)
RDW: 18.9 % — ABNORMAL HIGH (ref 11.5–15.5)
WBC: 13.3 10*3/uL — ABNORMAL HIGH (ref 4.0–10.5)
nRBC: 0 % (ref 0.0–0.2)

## 2021-05-27 LAB — BASIC METABOLIC PANEL
Anion gap: 11 (ref 5–15)
BUN: 18 mg/dL (ref 6–20)
CO2: 22 mmol/L (ref 22–32)
Calcium: 8.8 mg/dL — ABNORMAL LOW (ref 8.9–10.3)
Chloride: 100 mmol/L (ref 98–111)
Creatinine, Ser: 1.31 mg/dL — ABNORMAL HIGH (ref 0.44–1.00)
GFR, Estimated: 52 mL/min — ABNORMAL LOW (ref >60)
Glucose, Bld: 99 mg/dL (ref 70–99)
Potassium: 4 mmol/L (ref 3.5–5.1)
Sodium: 133 mmol/L — ABNORMAL LOW (ref 135–145)

## 2021-05-27 LAB — LACTATE DEHYDROGENASE: LDH: 329 U/L — ABNORMAL HIGH (ref 98–192)

## 2021-05-27 LAB — SAVE SMEAR(SSMR), FOR PROVIDER SLIDE REVIEW

## 2021-05-27 MED ORDER — SENNOSIDES-DOCUSATE SODIUM 8.6-50 MG PO TABS
1.0000 | ORAL_TABLET | Freq: Two times a day (BID) | ORAL | Status: DC
  Administered 2021-05-27 – 2021-05-28 (×3): 1 via ORAL
  Filled 2021-05-27 (×3): qty 1

## 2021-05-27 MED ORDER — NIFEDIPINE ER OSMOTIC RELEASE 30 MG PO TB24
30.0000 mg | ORAL_TABLET | Freq: Every day | ORAL | Status: DC
  Administered 2021-05-27 – 2021-05-28 (×2): 30 mg via ORAL
  Filled 2021-05-27 (×2): qty 1

## 2021-05-27 MED ORDER — NIFEDIPINE 10 MG PO CAPS: 30.0000 mg | ORAL_CAPSULE | Freq: Every day | ORAL | Status: DC

## 2021-05-27 MED ORDER — ACETAMINOPHEN 500 MG PO TABS
1000.0000 mg | ORAL_TABLET | Freq: Four times a day (QID) | ORAL | Status: DC
  Administered 2021-05-27 – 2021-05-28 (×5): 1000 mg via ORAL
  Filled 2021-05-27 (×5): qty 2

## 2021-05-27 NOTE — Progress Notes (Signed)
Subjective: I seen and evaluated Jeanette Gamble at bedside. She states she is feeling okay. Denies any chest pain or SOB. She states she is still experiencing some abdominal pain, worse with movement. However, states the pain medicine relieves the pain. She has not had a bowel movement since yesterday. She was able to tolerate breakfast, denies N/V.  Objective:  Vital signs in last 24 hours: Vitals:   05/26/21 0733 05/26/21 1553 05/26/21 1951 05/27/21 0350  BP: (!) 137/92 (!) 162/111 (!) 156/104 (!) 165/102  Pulse: 83 80 87 88  Resp: 18 19 18 18   Temp: 98.4 F (36.9 C) 98.8 F (37.1 C) 97.7 F (36.5 C) 97.8 F (36.6 C)  TempSrc: Oral  Oral Oral  SpO2: 98% 100% 100% 100%   Physical Exam Constitutional:      General: She is awake.  HENT:     Head: Normocephalic and atraumatic.  Cardiovascular:     Rate and Rhythm: Normal rate.     Heart sounds: Normal heart sounds.  Pulmonary:     Effort: Pulmonary effort is normal.     Breath sounds: Normal breath sounds.  Abdominal:     Palpations: Abdomen is soft.  Musculoskeletal:     Right lower leg: No edema.     Left lower leg: No edema.  Neurological:     General: No focal deficit present.     Mental Status: She is alert.  Psychiatric:        Behavior: Behavior normal. Behavior is cooperative.     Assessment/Plan:  Principal Problem:   Symptomatic anemia Active Problems:   Essential hypertension   Tobacco use disorder   Stage 3a chronic kidney disease ([facility])   Uterine fibroid   Iron deficiency anemia  Jeanette Gamble is a 41 y.o. with a pertinent PMH of hypertension, migraine headaches, tobacco use CKD stage IIIa iron deficiency anemia gout and uterine fibroid, who presented with lower abdominal pain dizziness presyncope and admitted for further work-up and evaluation of abdominal pain as well as hemoglobin 6.5.  Uterine fibroids Patient continues to experience lower abdominal pain though much improved since admission. She  denies any vaginal bleeding. Pelvic exam yield no bleeding, however, suspicious for PID due to cervical motion tenderness and mucopurulent discharge. Pt started on abx for treatment. Cervicovaginal ancillary swab unfortunately not found in cytology. Will need to repeat swab for testing. Though abx has already been initiated. Pt will benefit from outpatient follow up regarding large fibroids. Her pain likely result of fibroid degeneration.  --Pain control with Tylenol and oxycodone --Mefoxin IV 2g Q6H --STI screening pending --OB/GYN following; recc appreciated  Elevated Beta hCG As of now Beta hCG has been trending upward. There is still a question whether patient is pregnant or not given recent hx of heavy vaginal bleeding 1 week ago. Pt not on any form of contraception so there is a possibility. Imaging revealed no conception but too early to confirm. Will continue to trend beta hCG levels. --Recheck beta hCG 05/28/21 --OB/GYN following; recc appreciated --Avoid teratogenic agents  Iron deficiency anemia Current hbg 7.1 down from 7.2. Pt did not respond appropriately to transfusion 2 days ago and appears to still be lossing blood from unclear source. Suspicion for fibriod degeneration which could cause slow bleed. Though, on pelvic exam no bleeding noted.  Patient denies hematuria, hematochezia, or vaginal bleeding. --Trend CBC --IV Ferrlecit gluconate 250mg  x 4; last dose 05/29/2021 -- If patient falls below 7 transfuse  Hypertension Patient has a  history of hypertension and her home medications include amlodipine 10 mg; hydralazine 50 mg 3 times daily; HCTZ 25 mg; and lisinopril 40 mg.  These medications were held since admission due to determining pregnancy, as these medications may be teratogenic.  Patient's blood pressure has been elevated since admission, likely in the setting of pain and held treatment.  Labetalol as needed with parameters SBP greater than 180.  However patient has sustained  elevated pressures SBP 160s and DBP low 100s.  Pregnancy safe blood pressure medication nifedipine initiated. --Nifedipine 30 mg daily --Continue to monitor blood pressure --If it is determined that patient is not pregnant; can resume home medications at discharge  Migraine Patient denies headaches today. --Tylenol 1000 mg 4 times a day --Continue to monitor for changes in intensity or frequency  CKD stage IIIa Creatinine baseline range 1.1-1.3.  Patient is able to tolerate food and drinks.  Denies any dysuria or difficulty urinating.  Creatinine levels continue to improve and close to baseline at 1.31. -- Trend BMP --Avoid nephrotoxic agents --Encourage fluid intake  Tobacco use Continue nicotine patch 7 mg transdermal every 24 hours   Prior to Admission Living Arrangement: Anticipated Discharge Location: Barriers to Discharge: Dispo: Anticipated discharge in approximately 1-2 day(s).   Timothy Lasso, MD 05/27/2021, 10:42 AM Pager: [phone] After 5pm on weekdays and 1pm on weekends: On Call pager [phone]

## 2021-05-28 ENCOUNTER — Encounter (HOSPITAL_COMMUNITY): Payer: Self-pay | Admitting: Internal Medicine

## 2021-05-28 ENCOUNTER — Telehealth: Payer: Self-pay

## 2021-05-28 ENCOUNTER — Other Ambulatory Visit: Payer: Self-pay

## 2021-05-28 DIAGNOSIS — D500 Iron deficiency anemia secondary to blood loss (chronic): Secondary | ICD-10-CM

## 2021-05-28 DIAGNOSIS — D259 Leiomyoma of uterus, unspecified: Secondary | ICD-10-CM | POA: Diagnosis not present

## 2021-05-28 DIAGNOSIS — K0889 Other specified disorders of teeth and supporting structures: Secondary | ICD-10-CM

## 2021-05-28 DIAGNOSIS — D509 Iron deficiency anemia, unspecified: Secondary | ICD-10-CM | POA: Diagnosis not present

## 2021-05-28 DIAGNOSIS — Z3201 Encounter for pregnancy test, result positive: Secondary | ICD-10-CM | POA: Diagnosis not present

## 2021-05-28 DIAGNOSIS — D649 Anemia, unspecified: Secondary | ICD-10-CM | POA: Diagnosis not present

## 2021-05-28 DIAGNOSIS — O3680X0 Pregnancy with inconclusive fetal viability, not applicable or unspecified: Secondary | ICD-10-CM

## 2021-05-28 LAB — BASIC METABOLIC PANEL
Anion gap: 10 (ref 5–15)
BUN: 19 mg/dL (ref 6–20)
CO2: 20 mmol/L — ABNORMAL LOW (ref 22–32)
Calcium: 8.7 mg/dL — ABNORMAL LOW (ref 8.9–10.3)
Chloride: 106 mmol/L (ref 98–111)
Creatinine, Ser: 1.27 mg/dL — ABNORMAL HIGH (ref 0.44–1.00)
GFR, Estimated: 54 mL/min — ABNORMAL LOW (ref >60)
Glucose, Bld: 74 mg/dL (ref 70–99)
Potassium: 4.4 mmol/L (ref 3.5–5.1)
Sodium: 136 mmol/L (ref 135–145)

## 2021-05-28 LAB — CBC WITH DIFFERENTIAL/PLATELET
Abs Immature Granulocytes: 0.11 10*3/uL — ABNORMAL HIGH (ref 0.00–0.07)
Basophils Absolute: 0 10*3/uL (ref 0.0–0.1)
Basophils Relative: 0 %
Eosinophils Absolute: 0.2 10*3/uL (ref 0.0–0.5)
Eosinophils Relative: 2 %
HCT: 25.1 % — ABNORMAL LOW (ref 36.0–46.0)
Hemoglobin: 7.1 g/dL — ABNORMAL LOW (ref 12.0–15.0)
Immature Granulocytes: 1 %
Lymphocytes Relative: 15 %
Lymphs Abs: 1.4 10*3/uL (ref 0.7–4.0)
MCH: 21.4 pg — ABNORMAL LOW (ref 26.0–34.0)
MCHC: 28.3 g/dL — ABNORMAL LOW (ref 30.0–36.0)
MCV: 75.6 fL — ABNORMAL LOW (ref 80.0–100.0)
Monocytes Absolute: 0.6 10*3/uL (ref 0.1–1.0)
Monocytes Relative: 7 %
Neutro Abs: 7.1 10*3/uL (ref 1.7–7.7)
Neutrophils Relative %: 75 %
Platelets: 291 10*3/uL (ref 150–400)
RBC: 3.32 MIL/uL — ABNORMAL LOW (ref 3.87–5.11)
RDW: 19.7 % — ABNORMAL HIGH (ref 11.5–15.5)
WBC: 9.5 10*3/uL (ref 4.0–10.5)
nRBC: 0 % (ref 0.0–0.2)

## 2021-05-28 LAB — HCG, QUANTITATIVE, PREGNANCY: hCG, Beta Chain, Quant, S: 1200 m[IU]/mL — ABNORMAL HIGH (ref <5)

## 2021-05-28 LAB — HAPTOGLOBIN: Haptoglobin: 225 mg/dL (ref 42–296)

## 2021-05-28 MED ORDER — AMOXICILLIN-POT CLAVULANATE 875-125 MG PO TABS: 1.0000 | ORAL_TABLET | Freq: Two times a day (BID) | ORAL | 0 refills | Status: AC

## 2021-05-28 MED ORDER — FERROUS SULFATE 325 (65 FE) MG PO TABS: 325.0000 mg | ORAL_TABLET | ORAL | 1 refills | Status: DC

## 2021-05-28 MED ORDER — OXYCODONE HCL 5 MG PO TABS: 5.0000 mg | ORAL_TABLET | ORAL | 0 refills | Status: AC | PRN

## 2021-05-28 MED ORDER — NICOTINE 7 MG/24HR TD PT24: 7.0000 mg | MEDICATED_PATCH | Freq: Every day | TRANSDERMAL | 0 refills | Status: DC

## 2021-05-28 MED ORDER — ACETAMINOPHEN 500 MG PO TABS: 1000.0000 mg | ORAL_TABLET | Freq: Four times a day (QID) | ORAL | 0 refills | Status: DC

## 2021-05-28 MED ORDER — NIFEDIPINE ER 60 MG PO TB24: 60.0000 mg | ORAL_TABLET | Freq: Two times a day (BID) | ORAL | 0 refills | Status: DC

## 2021-05-28 NOTE — Telephone Encounter (Addendum)
-----   Message from Donnamae Jude, MD sent at 05/28/2021 10:18 AM EST ----- Needs viability scan asap in next 2-3 days. She is considering termination and then will want some treatment of her fibroids. Then will need hospital f/u in 4-6 weeks. Has seen Peggy Constant in the past and Radene Gunning in hospital and can see any OB/GYN. Will likely need pap and endometrial sampling if no longer pregnant.   [place] scheduled for Thursday, 05/30/21 at 2 PM. Kennon Rounds and admin notified. Pt not called due to current admission.

## 2021-05-28 NOTE — Progress Notes (Signed)
Patient ID: Jeanette Gamble, female   DOB: 1981/01/28, 41 y.o.   MRN: [id number]   Assessment/Plan: Principal Problem:   Symptomatic anemia Active Problems:   Essential hypertension   Tobacco use disorder   Stage 3a chronic kidney disease (HCC)   Uterine fibroid   Iron deficiency anemia   Positive pregnancy test  Pregnancy of unknown location Appropriately rising HCG now Will need a couple of more days to allow this to get above 1500 and can get viability scan - scheduled by our office. Lab Results  Component Value Date   HCGBETAQNT 1,200 (H) 05/28/2021   HCGBETAQNT 466 (H) 05/26/2021   HCGBETAQNT 361 (H) 05/25/2021  She is now stating that she may not want to continue with pregnancy Termination of Pregnancy options given to patient in DC instructions. If she chooses not to terminate pregnancy, we can see her for prenatal care.  Uterine fibroid Might be culprit for pain as likely degenerating and almost certainly playing a role in her anemia, though she will bleed less with pregnancy. If she proceeds with TOP, would likely need pap, endometrial sampling and then discussion of treatment options. If not, all treatment options will have to await the completion of pregnancy.  Pelvic pain Exam is benign. WBC improved which can be seen in PID and degeneration of fibroid. She also has a cyst, which might also be contributing to pain. Cultures appear to have been lost but 10d course of broad spectrum Abx to include anaerobic coverage is recommended empirically. Augmentin is safe in pregnancy.  Iron deficiency anemia Hgb is stable and > 7 She is s/p IV iron sucrose infusion  Essential hypertension BP is poorly controlled Goals for BP control in pregnancy are < 135/85 Calcium Channel blockers (any are ok in pregnancy) Avoid ACE-I and ARBs Alpha 1 antagonists with beta blockers such as Labetalol are preferred to B-blockers alone, as they can lead to fetal growth restriction  Tobacco use  disorder Encourage cessation with pregnancy and for lifelong improved health  Stage 3a chronic kidney disease ([place]) Advised improved BP control to help assist in preservation of renal function. Patients with kidney disease usually have poorer OB outcomes. She has a h/o pre-eclampsia and prior C-section. Underlying renal disease, advanced maternal age and underlying HTN increase her risk of this recurring by a lot.   Subjective: Interval History:Feels extremely hungry.  Objective: Vital signs in last 24 hours: Temp:  [97.9 F (36.6 C)-98.6 F (37 C)] 98.4 F (36.9 C) (02/07 0757) Pulse Rate:  [59-71] 59 (02/07 0757) Resp:  [16-18] 16 (02/07 0757) BP: (138-146)/(84-102) 138/84 (02/07 0757) SpO2:  [97 %-100 %] 100 % (02/07 0757)  Intake/Output from previous day: 02/06 0701 - 02/07 0700 In: 1823.9 [P.O.:480] Out: -  Intake/Output this shift: No intake/output data recorded.  General appearance: alert, cooperative, and appears stated age Head: Normocephalic, without obvious abnormality, atraumatic Neck: supple, symmetrical, trachea midline Lungs: normal effort Heart: regular rate and rhythm Abdomen: soft, diffusely tender, no rebound Extremities: Homans sign is negative, no sign of DVT Skin: Skin color, texture, turgor normal. No rashes or lesions Neurologic: Grossly normal  Results for orders placed or performed during the hospital encounter of 05/24/21 (from the past 24 hour(s))  hCG, quantitative, pregnancy     Status: Abnormal   Collection Time: 05/28/21  3:42 AM  Result Value Ref Range   hCG, Beta Chain, Quant, S 1,200 (H) <5 mIU/mL  CBC with Differential/Platelet     Status: Abnormal  Collection Time: 05/28/21  3:42 AM  Result Value Ref Range   WBC 9.5 4.0 - 10.5 K/uL   RBC 3.32 (L) 3.87 - 5.11 MIL/uL   Hemoglobin 7.1 (L) 12.0 - 15.0 g/dL   HCT 25.1 (L) 36.0 - 46.0 %   MCV 75.6 (L) 80.0 - 100.0 fL   MCH 21.4 (L) 26.0 - 34.0 pg   MCHC 28.3 (L) 30.0 - 36.0 g/dL    RDW 19.7 (H) 11.5 - 15.5 %   Platelets 291 150 - 400 K/uL   nRBC 0.0 0.0 - 0.2 %   Neutrophils Relative % 75 %   Neutro Abs 7.1 1.7 - 7.7 K/uL   Lymphocytes Relative 15 %   Lymphs Abs 1.4 0.7 - 4.0 K/uL   Monocytes Relative 7 %   Monocytes Absolute 0.6 0.1 - 1.0 K/uL   Eosinophils Relative 2 %   Eosinophils Absolute 0.2 0.0 - 0.5 K/uL   Basophils Relative 0 %   Basophils Absolute 0.0 0.0 - 0.1 K/uL   Immature Granulocytes 1 %   Abs Immature Granulocytes 0.11 (H) 0.00 - 0.07 K/uL  Basic metabolic panel     Status: Abnormal   Collection Time: 05/28/21  3:42 AM  Result Value Ref Range   Sodium 136 135 - 145 mmol/L   Potassium 4.4 3.5 - 5.1 mmol/L   Chloride 106 98 - 111 mmol/L   CO2 20 (L) 22 - 32 mmol/L   Glucose, Bld 74 70 - 99 mg/dL   BUN 19 6 - 20 mg/dL   Creatinine, Ser 1.27 (H) 0.44 - 1.00 mg/dL   Calcium 8.7 (L) 8.9 - 10.3 mg/dL   GFR, Estimated 54 (L) >60 mL/min   Anion gap 10 5 - 15    Studies/Results: US OB Comp < 14 Wks  Result Date: 05/24/2021 CLINICAL DATA:  Pelvic pain. EXAM: OBSTETRIC <14 WK [place] AND TRANSVAGINAL OB US TECHNIQUE: Both transabdominal and transvaginal ultrasound examinations were performed for complete evaluation of the gestation as well as the maternal uterus, adnexal regions, and pelvic cul-de-sac. Transvaginal technique was performed to assess early pregnancy. COMPARISON:  03/24/2019 FINDINGS: Intrauterine gestational sac: None identified Maternal uterus/adnexae: The uterus is anteverted. The uterus measures 16.5 x 11.8 x 13.3 cm. Uterine volume is 1352 cc. The myometrial echotexture is diffusely coarsened in keeping with uterine fibroids. The dominant fibroid is seen within the anterior fundus measuring 8.9 x 8.8 x 0.8 cm. And exophytic fibroid is seen arising from the left fundus measuring 4.3 x 4.0 x 5.4 cm. The endometrial stripe is poorly visualized, but measures roughly 8 mm in thickness. The right ovary measures 5.7 x 4.0 x 4.9 cm and contains a 4.4  cm complex cystic lesion demonstrating diffuse internal echoes most in keeping with a hemorrhagic cyst or, less likely, an endometrioma. A trace amount of free fluid is seen within the right adnexa. The left ovary is not visualized. IMPRESSION: No intrauterine gestational sac identified. Multi fibroid uterus. 4.4 cm right adnexal cyst most in keeping with a hemorrhagic cyst. Electronically Signed   By: Fidela Salisbury M.D.   On: 05/24/2021 21:54   US OB Transvaginal  Result Date: 05/24/2021 CLINICAL DATA:  Pain EXAM: TRANSVAGINAL OB ULTRASOUND TECHNIQUE: Transvaginal ultrasound was performed for complete evaluation of the gestation as well as the maternal uterus, adnexal regions, and pelvic cul-de-sac. COMPARISON:  None. FINDINGS: Intrauterine gestational sac: None Yolk sac:  Not Visualized. Embryo:  Not Visualized. Cardiac Activity: Not Visualized. Heart Rate:  bpm  MSD:   mm    w     d CRL:     mm    w  d                  [place] EDC: Subchorionic hemorrhage:  None visualized. Maternal uterus/adnexae: Enlarged fibroid uterus. 9 cm central fibroid. Exophytic anterior left fundal fibroid measures up to 5.4 cm. Endometrium difficult to visualize due to central fibroid. 4.4 cm hemorrhagic cyst in the right ovary. Trace free fluid in the pelvis. IMPRESSION: No intrauterine pregnancy visualized. Differential considerations would include early intrauterine pregnancy too early to visualize, spontaneous abortion, or occult ectopic pregnancy. Recommend close clinical followup and serial quantitative beta HCGs and ultrasounds. Fibroid uterus.  Large central fibroid measures up to 9 cm. 4.4 cm right ovarian hemorrhagic cyst. Electronically Signed   By: Rolm Baptise M.D.   On: 05/24/2021 21:46   CT ABDOMEN PELVIS W CONTRAST  Result Date: 05/24/2021 CLINICAL DATA:  A 41 year old female presents with LEFT lower quadrant pain. EXAM: CT ABDOMEN AND PELVIS WITH CONTRAST TECHNIQUE: Multidetector CT imaging of the abdomen and pelvis  was performed using the standard protocol following bolus administration of intravenous contrast. RADIATION DOSE REDUCTION: This exam was performed according to the departmental dose-optimization program which includes automated exposure control, adjustment of the mA and/or kV according to patient size and/or use of iterative reconstruction technique. CONTRAST:  170mL OMNIPAQUE IOHEXOL 300 MG/ML  SOLN COMPARISON:  Comparison made with March 01/09/2020. FINDINGS: Lower chest: Incidental imaging of the lung bases is unremarkable, no effusion or sign of consolidation. Hepatobiliary: No focal, suspicious hepatic lesion. No pericholecystic stranding. No biliary duct dilation. Portal vein is patent. Hepatic steatosis. Pancreas: Normal, without mass, inflammation or ductal dilatation. Spleen: Normal. Adrenals/Urinary Tract: Adrenal glands are unremarkable. Symmetric renal enhancement. No sign of hydronephrosis. No suspicious renal lesion or perinephric stranding. Urinary bladder is grossly unremarkable. Stomach/Bowel: Appendix is normal. No perigastric stranding. No sign of small bowel dilation to suggest acute small bowel process. No signs of inflammation adjacent to the colon or small bowel. Stool in the colon. Vascular/Lymphatic: Aorta with smooth contours. IVC with smooth contours. No aneurysmal dilation of the abdominal aorta. There is no gastrohepatic or hepatoduodenal ligament lymphadenopathy. No retroperitoneal or mesenteric lymphadenopathy. No pelvic sidewall lymphadenopathy. Reproductive: Multiple leiomyomata in the uterus, 3 large leiomyomata in particular, the first a subserosal leiomyoma that extends cephalad from the uterine fundus measuring 7.9 x 7.4 cm greatest axial dimension. The second a subserosal leiomyoma that extends from the uterine fundus towards the LEFT adnexa measuring 8.0 x 6.8 cm. The third large leiomyoma that was seen previously in the mid uterus and previously measured approximately 7.4 x  7.4 cm now measures 8.6 x 8.1 cm in shows diffuse low attenuation relative to other leiomyomas that are seen. Mildly irregular margins of this leiomyoma are noted along the cephalad aspect, unclear whether this is related to the adjacent endometrium which may be the case though this is not clear (image 59/3) this area measuring 2.6 x 1.9 cm. RIGHT ovarian cyst measuring 5.1 x 3.5 cm appears thin walled and previously measured approximately 3.2 cm. Probable corpus luteum in the LEFT ovary. LEFT ovary grossly unchanged otherwise compared to previous imaging. Other: No ascites.  No pneumoperitoneum. Musculoskeletal: No acute or significant osseous findings. IMPRESSION: Large leiomyoma in the mid uterus anteriorly with considerable enlargement now with little internal enhancement as compared to previous imaging and as compared to adjacent leiomyomata. This likely represents  a large degenerating leiomyoma and may explain the patient's symptoms. Gyn consultation may be helpful. Irregularity along the RIGHT lateral aspect of leiomyoma may represent another smaller degenerating fibroid or "trapped" endometrium in the RIGHT uterine fundus due to marked fibroid enlargement. Would suggest follow-up to ensure that this does not represent invasive border as invasive border can be associated with malignant transformation. Short interval follow-up could be considered either with pelvic sonogram initially or MRI as warranted. Greater than 5 cm cyst associated with the RIGHT ovary. Follow-up by [place] is recommended in 3-6 months or sooner if symptoms worsen or localized to this area. Note: This recommendation does not apply to premenarchal patients and to those with increased risk (genetic, family history, elevated tumor markers or other high-risk factors) of ovarian cancer. Reference: JACR 2020 Feb; 17(2):248-254 Electronically Signed   By: Zetta Bills M.D.   On: 05/24/2021 17:26   DG Chest Portable 1 View  Result Date:  05/24/2021 CLINICAL DATA:  Shortness of breath. EXAM: PORTABLE CHEST 1 VIEW COMPARISON:  January 17, 2020. FINDINGS: Stable cardiomegaly. Both lungs are clear. The visualized skeletal structures are unremarkable. IMPRESSION: No active disease. Electronically Signed   By: Marijo Conception M.D.   On: 05/24/2021 16:09    Scheduled Meds:  acetaminophen  1,000 mg Oral QID   nicotine  7 mg Transdermal Daily   NIFEdipine  30 mg Oral Daily   pantoprazole  40 mg Oral BID AC   polyethylene glycol  17 g Oral Daily   senna-docusate  1 tablet Oral BID   Continuous Infusions:  sodium chloride Stopped (05/25/21 0326)   cefOXitin 2 g (05/28/21 0629)   ferric gluconate (FERRLECIT) IVPB 250 mg (05/28/21 1001)   PRN Meds:alum & mag hydroxide-simeth, labetalol, ondansetron **OR** ondansetron (ZOFRAN) IV, oxyCODONE    LOS: 3 days   Donnamae Jude, MD 05/28/2021 10:22 AM

## 2021-05-28 NOTE — Progress Notes (Signed)
Patient given discharge instructions and stated understanding. 

## 2021-05-28 NOTE — Discharge Instructions (Addendum)
[facility] Address: [place] [place], [place] [address] Hours:  Monday 9AM-5PM Tuesday 10AM-6PM Wednesday 11AM-7PM Thursday 9AM-5PM Friday              8AM-2PM Saturday Sunday  Phone: [phone]  [facility] Address: [place], [place] [address] Hours Monday 9AM-5PM Tuesday 10AM-6PM Wednesday 11AM-7PM Thursday 9AM-5PM Friday              8AM-4PM Saturday 8AM-12PM Sunday  Phone: [phone]   Please avoid home medications listed in your summary for now as it is unsafe to take in pregnancy. There are new medications for you to take for your blood pressure listed in the summary, please take those medications only. Also, take your iron supplements, Monday, Wednesday and Friday with food. Please follow up with the OB/GYN and PCP.

## 2021-05-28 NOTE — Plan of Care (Signed)
  Problem: Education: Goal: Knowledge of General Education information will improve Description: Including pain rating scale, medication(s)/side effects and non-pharmacologic comfort measures Outcome: Progressing   Problem: Nutrition: Goal: Adequate nutrition will be maintained Outcome: Progressing   Problem: Coping: Goal: Level of anxiety will decrease Outcome: Progressing   Problem: Elimination: Goal: Will not experience complications related to bowel motility Outcome: Progressing   Problem: Pain Managment: Goal: General experience of comfort will improve Outcome: Progressing   Problem: Safety: Goal: Ability to remain free from injury will improve Outcome: Progressing   

## 2021-05-28 NOTE — Discharge Summary (Signed)
Name: Jeanette Gamble MRN: [id number] DOB: [birth year] 41 y.o. PCP: Harvie Heck, MD  Date of Admission: 05/24/2021 12:47 PM Date of Discharge: 05/28/21 Attending Physician: Lottie Mussel, MD  Discharge Diagnosis: 1. Uterine fibriods 2. Elevated beta hCG 3. Iron deficiency anemia 4. Migraine headache  Discharge Medications: Allergies as of 05/28/2021       Reactions   Gabapentin Other (See Comments)   memory loss   Naproxen Nausea Only   headache   Tramadol Nausea Only   headache        Medication List     STOP taking these medications    allopurinol 100 MG tablet Commonly known as: ZYLOPRIM   amLODipine 10 MG tablet Commonly known as: NORVASC   amoxicillin 500 MG capsule Commonly known as: AMOXIL   aspirin 81 MG EC tablet   atorvastatin 40 MG tablet Commonly known as: LIPITOR   Chantix Starting Month Pak 0.5 MG X 11 & 1 MG X 42 tablet Generic drug: varenicline   cyclobenzaprine 5 MG tablet Commonly known as: FLEXERIL   diclofenac Sodium 1 % Gel Commonly known as: VOLTAREN   hydrALAZINE 50 MG tablet Commonly known as: APRESOLINE   hydrochlorothiazide 25 MG tablet Commonly known as: HYDRODIURIL   lisinopril 40 MG tablet Commonly known as: ZESTRIL   Mitigare 0.6 MG Caps Generic drug: Colchicine   neomycin-polymyxin-hydrocortisone 3.5-10000-1 OTIC suspension Commonly known as: CORTISPORIN   omeprazole 20 MG capsule Commonly known as: PRILOSEC   tiZANidine 4 MG tablet Commonly known as: ZANAFLEX       TAKE these medications    acetaminophen 500 MG tablet Commonly known as: TYLENOL Take 2 tablets (1,000 mg total) by mouth 4 (four) times daily.   amoxicillin-clavulanate 875-125 MG tablet Commonly known as: Augmentin Take 1 tablet by mouth 2 (two) times daily for 14 days. Start taking on: May 29, 2021 What changed: when to take this   ferrous sulfate 325 (65 FE) MG tablet Take 1 tablet (325 mg total) by mouth every Monday,  Wednesday, and Friday. Start taking on: May 29, 2021 What changed: when to take this   nicotine 7 mg/24hr patch Commonly known as: NICODERM CQ - dosed in mg/24 hr Place 1 patch (7 mg total) onto the skin daily. Start taking on: May 29, 2021   NIFEdipine 60 MG 24 hr tablet Commonly known as: ADALAT CC Take 1 tablet (60 mg total) by mouth 2 (two) times daily.   oxyCODONE 5 MG immediate release tablet Commonly known as: Oxy IR/ROXICODONE Take 1 tablet (5 mg total) by mouth every 4 (four) hours as needed for up to 3 days for moderate pain.        Disposition and follow-up:   Jeanette Gamble was discharged from [facility] in Stable condition.  At the hospital follow up visit please address:  1.  Follow up with OB/GYN outpatient and follow up with PCP within 1 week  2.  Labs / imaging needed at time of follow-up: Beta hCG; CBC, hgb levels  3.  Pending labs/ test needing follow-up: abdominal ultrasound  Follow-up Appointments:  Follow-up [facility] Follow up in 4 week(s).   Specialty: Obstetrics and Gynecology Why: Hospital follow-up, they will call you with an appointment They will arrange a follow-up ultrasound in 2-3 days Contact information: [place] [id number] [place], [place],  MD. Call in 1 week(s).   Specialty: Internal Medicine Contact information: [address] [place] [address] [place] Hospital Course by problem list: 1. Patient presented with severe lower abd pain and was found to have multiple large uterine firbiods on imaging. OB/GYN consulted and recommmended conservative management of pain.  There was suspicion for PID or STI.  Pelvic exam significant for mucopurulent discharge and cervical motion tenderness.  Patient was started on Mefoxin IV 2g Q6H.  Cervicovaginal ancillary swab unfortunately unfound in cytology.  Patient experienced no vaginal bleeding during hospital course.  Pain controlled with Tylenol and oxycodone.  Beta hCG levels were assessed on admission, which were elevated; repeat levels continue to trend upward.  At time of discharge beta-hCG levels doubled indicating positive pregnancy test.  However abdominal ultrasound showed no conception, though possibly too early to visualize.  Teratogenic agents were avoided throughout hospital course.  Patient found to be anemic on admission with hemoglobin of 6.5.  Patient was transfused 2 units of RBCs.  Slight improvement though not appropriate response.  Hemoglobin 7.1 at time of discharge, patient was asymptomatic.  Her iron studies indicate deficiency.  Patient received IV iron during hospitalization and discharged on oral supplements.  Patient was hypertensive during hospital course and home medications were avoided due to teratogenicity.  Patient was started on pregnancy safe medication, nifedipine.  Patient experienced migraine headache, in which she has a history, patient received Tylenol with moderate relief throughout hospital course.  No changes in intensity or frequency noted.  Patient found to have AKI on CKD stage IIIa, creatinine levels improved throughout hospital course and back to baseline.  Patient eating and drinking appropriately.  Patient stable for discharge.  Discharge Exam:   BP 138/84 (BP Location: Left Arm)    Pulse (!) 59    Temp 98.4 F (36.9 C) (Oral)    Resp 16    SpO2 100%  Discharge exam: Physical Exam Constitutional:      General: She is awake.  Cardiovascular:     Rate and Rhythm: Normal rate and regular rhythm.     Heart sounds: Normal heart sounds.  Pulmonary:     Effort: Pulmonary effort is normal.     Breath sounds: Normal breath sounds and air entry.  Abdominal:     Tenderness: There is abdominal tenderness.  Musculoskeletal:     Right lower leg: No  edema.     Left lower leg: No edema.  Skin:    General: Skin is warm and dry.  Neurological:     Mental Status: She is alert.  Psychiatric:        Behavior: Behavior is cooperative.     Pertinent Labs, Studies, and Procedures:  CBC Latest Ref Rng & Units 05/28/2021 05/27/2021 05/26/2021  WBC 4.0 - 10.5 K/uL 9.5 13.3(H) 14.1(H)  Hemoglobin 12.0 - 15.0 g/dL 7.1(L) 7.1(L) 7.2(L)  Hematocrit 36.0 - 46.0 % 25.1(L) 24.3(L) 24.6(L)  Platelets 150 - 400 K/uL 291 309 304   BMP Latest Ref Rng & Units 05/28/2021 05/27/2021 05/26/2021  Glucose 70 - 99 mg/dL 74 99 120(H)  BUN 6 - 20 mg/dL 19 18 22(H)  Creatinine 0.44 - 1.00 mg/dL 1.27(H) 1.31(H) 1.39(H)  BUN/Creat Ratio 9 - 23 - - -  Sodium 135 - 145 mmol/L 136 133(L) 134(L)  Potassium 3.5 - 5.1 mmol/L 4.4 4.0 3.6  Chloride 98 - 111 mmol/L  106 100 103  CO2 22 - 32 mmol/L 20(L) 22 21(L)  Calcium 8.9 - 10.3 mg/dL 8.7(L) 8.8(L) 8.6(L)   US OB Comp < 14 Wks  Result Date: 05/24/2021 CLINICAL DATA:  Pelvic pain. EXAM: OBSTETRIC <14 WK [place] AND TRANSVAGINAL OB US TECHNIQUE: Both transabdominal and transvaginal ultrasound examinations were performed for complete evaluation of the gestation as well as the maternal uterus, adnexal regions, and pelvic cul-de-sac. Transvaginal technique was performed to assess early pregnancy. COMPARISON:  03/24/2019 FINDINGS: Intrauterine gestational sac: None identified Maternal uterus/adnexae: The uterus is anteverted. The uterus measures 16.5 x 11.8 x 13.3 cm. Uterine volume is 1352 cc. The myometrial echotexture is diffusely coarsened in keeping with uterine fibroids. The dominant fibroid is seen within the anterior fundus measuring 8.9 x 8.8 x 0.8 cm. And exophytic fibroid is seen arising from the left fundus measuring 4.3 x 4.0 x 5.4 cm. The endometrial stripe is poorly visualized, but measures roughly 8 mm in thickness. The right ovary measures 5.7 x 4.0 x 4.9 cm and contains a 4.4 cm complex cystic lesion demonstrating diffuse  internal echoes most in keeping with a hemorrhagic cyst or, less likely, an endometrioma. A trace amount of free fluid is seen within the right adnexa. The left ovary is not visualized. IMPRESSION: No intrauterine gestational sac identified. Multi fibroid uterus. 4.4 cm right adnexal cyst most in keeping with a hemorrhagic cyst. Electronically Signed   By: Fidela Salisbury M.D.   On: 05/24/2021 21:54   US OB Transvaginal  Result Date: 05/24/2021 CLINICAL DATA:  Pain EXAM: TRANSVAGINAL OB ULTRASOUND TECHNIQUE: Transvaginal ultrasound was performed for complete evaluation of the gestation as well as the maternal uterus, adnexal regions, and pelvic cul-de-sac. COMPARISON:  None. FINDINGS: Intrauterine gestational sac: None Yolk sac:  Not Visualized. Embryo:  Not Visualized. Cardiac Activity: Not Visualized. Heart Rate:  bpm MSD:   mm    w     d CRL:     mm    w  d                  [place] EDC: Subchorionic hemorrhage:  None visualized. Maternal uterus/adnexae: Enlarged fibroid uterus. 9 cm central fibroid. Exophytic anterior left fundal fibroid measures up to 5.4 cm. Endometrium difficult to visualize due to central fibroid. 4.4 cm hemorrhagic cyst in the right ovary. Trace free fluid in the pelvis. IMPRESSION: No intrauterine pregnancy visualized. Differential considerations would include early intrauterine pregnancy too early to visualize, spontaneous abortion, or occult ectopic pregnancy. Recommend close clinical followup and serial quantitative beta HCGs and ultrasounds. Fibroid uterus.  Large central fibroid measures up to 9 cm. 4.4 cm right ovarian hemorrhagic cyst. Electronically Signed   By: Rolm Baptise M.D.   On: 05/24/2021 21:46   CT ABDOMEN PELVIS W CONTRAST  Result Date: 05/24/2021 CLINICAL DATA:  A 41 year old female presents with LEFT lower quadrant pain. EXAM: CT ABDOMEN AND PELVIS WITH CONTRAST TECHNIQUE: Multidetector CT imaging of the abdomen and pelvis was performed using the standard protocol  following bolus administration of intravenous contrast. RADIATION DOSE REDUCTION: This exam was performed according to the departmental dose-optimization program which includes automated exposure control, adjustment of the mA and/or kV according to patient size and/or use of iterative reconstruction technique. CONTRAST:  169mL OMNIPAQUE IOHEXOL 300 MG/ML  SOLN COMPARISON:  Comparison made with March 01/09/2020. FINDINGS: Lower chest: Incidental imaging of the lung bases is unremarkable, no effusion or sign of consolidation. Hepatobiliary: No focal, suspicious hepatic lesion. No pericholecystic  stranding. No biliary duct dilation. Portal vein is patent. Hepatic steatosis. Pancreas: Normal, without mass, inflammation or ductal dilatation. Spleen: Normal. Adrenals/Urinary Tract: Adrenal glands are unremarkable. Symmetric renal enhancement. No sign of hydronephrosis. No suspicious renal lesion or perinephric stranding. Urinary bladder is grossly unremarkable. Stomach/Bowel: Appendix is normal. No perigastric stranding. No sign of small bowel dilation to suggest acute small bowel process. No signs of inflammation adjacent to the colon or small bowel. Stool in the colon. Vascular/Lymphatic: Aorta with smooth contours. IVC with smooth contours. No aneurysmal dilation of the abdominal aorta. There is no gastrohepatic or hepatoduodenal ligament lymphadenopathy. No retroperitoneal or mesenteric lymphadenopathy. No pelvic sidewall lymphadenopathy. Reproductive: Multiple leiomyomata in the uterus, 3 large leiomyomata in particular, the first a subserosal leiomyoma that extends cephalad from the uterine fundus measuring 7.9 x 7.4 cm greatest axial dimension. The second a subserosal leiomyoma that extends from the uterine fundus towards the LEFT adnexa measuring 8.0 x 6.8 cm. The third large leiomyoma that was seen previously in the mid uterus and previously measured approximately 7.4 x 7.4 cm now measures 8.6 x 8.1 cm in shows  diffuse low attenuation relative to other leiomyomas that are seen. Mildly irregular margins of this leiomyoma are noted along the cephalad aspect, unclear whether this is related to the adjacent endometrium which may be the case though this is not clear (image 59/3) this area measuring 2.6 x 1.9 cm. RIGHT ovarian cyst measuring 5.1 x 3.5 cm appears thin walled and previously measured approximately 3.2 cm. Probable corpus luteum in the LEFT ovary. LEFT ovary grossly unchanged otherwise compared to previous imaging. Other: No ascites.  No pneumoperitoneum. Musculoskeletal: No acute or significant osseous findings. IMPRESSION: Large leiomyoma in the mid uterus anteriorly with considerable enlargement now with little internal enhancement as compared to previous imaging and as compared to adjacent leiomyomata. This likely represents a large degenerating leiomyoma and may explain the patient's symptoms. Gyn consultation may be helpful. Irregularity along the RIGHT lateral aspect of leiomyoma may represent another smaller degenerating fibroid or "trapped" endometrium in the RIGHT uterine fundus due to marked fibroid enlargement. Would suggest follow-up to ensure that this does not represent invasive border as invasive border can be associated with malignant transformation. Short interval follow-up could be considered either with pelvic sonogram initially or MRI as warranted. Greater than 5 cm cyst associated with the RIGHT ovary. Follow-up by [place] is recommended in 3-6 months or sooner if symptoms worsen or localized to this area. Note: This recommendation does not apply to premenarchal patients and to those with increased risk (genetic, family history, elevated tumor markers or other high-risk factors) of ovarian cancer. Reference: JACR 2020 Feb; 17(2):248-254 Electronically Signed   By: Zetta Bills M.D.   On: 05/24/2021 17:26   DG Chest Portable 1 View  Result Date: 05/24/2021 CLINICAL DATA:  Shortness of breath.  EXAM: PORTABLE CHEST 1 VIEW COMPARISON:  January 17, 2020. FINDINGS: Stable cardiomegaly. Both lungs are clear. The visualized skeletal structures are unremarkable. IMPRESSION: No active disease. Electronically Signed   By: Marijo Conception M.D.   On: 05/24/2021 16:09     Discharge Instructions: Discharge Instructions     Diet - low sodium heart healthy   Complete by: As directed    Increase activity slowly   Complete by: As directed        Signed: Timothy Lasso, MD 05/28/2021, 11:35 AM   Pager: [phone]

## 2021-05-28 NOTE — Plan of Care (Signed)

## 2021-05-29 ENCOUNTER — Telehealth: Payer: Self-pay | Admitting: *Deleted

## 2021-05-29 ENCOUNTER — Ambulatory Visit: Payer: Self-pay | Admitting: *Deleted

## 2021-05-29 ENCOUNTER — Ambulatory Visit (INDEPENDENT_AMBULATORY_CARE_PROVIDER_SITE_OTHER): Payer: Medicaid Other | Admitting: Internal Medicine

## 2021-05-29 DIAGNOSIS — I808 Phlebitis and thrombophlebitis of other sites: Secondary | ICD-10-CM

## 2021-05-29 DIAGNOSIS — I809 Phlebitis and thrombophlebitis of unspecified site: Secondary | ICD-10-CM | POA: Insufficient documentation

## 2021-05-29 LAB — PATHOLOGIST SMEAR REVIEW

## 2021-05-29 NOTE — Telephone Encounter (Signed)
°  Chief Complaint: Severe pain at previous IV site in right bend of her arm from ED visit. Symptoms: Pain, redness, hard knot, difficulty moving right arm Frequency: Now Pertinent Negatives: Patient denies IV was removed after admission to hospital from the ED. Disposition: [x] ED /[] Urgent Care (no appt availability in office) / [] Appointment(In office/virtual)/ []  [facility]/ [] Home Care/ [] Refused Recommended Disposition /[] [place]/ []  Follow-up with PCP Additional Notes: Pt was agreeable to returning to the ED at [facility].

## 2021-05-29 NOTE — Telephone Encounter (Addendum)
Call from patient states has pain from previous IV .  States was getting an Iron Infusion when site stated to hurt now has a knot and is red and warm to touch. Infusion was stopped at this appointment.  Arm is painful and has the knot still.  Is currently using warm compresses for the pain.  Appointment scheduled this afternoon with Dr. Marva Panda at 1:45 PM.  Message from Dr. Marva Panda to change to Telehealth this afternoon.  Patient was called and informed of.

## 2021-05-29 NOTE — Telephone Encounter (Signed)
error 

## 2021-05-29 NOTE — Progress Notes (Signed)
Internal Medicine Clinic Attending  Case discussed with Dr. Marva Panda  At the time of the visit.  We reviewed the residents history and exam and pertinent patient test results.  I agree with the assessment, diagnosis, and plan of care documented in the residents note. Hospital f/u scheduled on Monday, 06/04/21. Resident encouraged the patient to f/u before then if no improvement in current arm symptoms.

## 2021-05-29 NOTE — Assessment & Plan Note (Signed)
Ms Jeanette Gamble is being evaluated for right arm pain. She reports painful IV insertion during recent hospitalization at which time she initially noted the pain. She notes that the pain was worse during IV iron infusion at which point her IV was exchanged. She continues to have right arm pain since leaving the hospital.She noticed a knot in the area of IV insertion and that it feels warm and tender. Denies any surrounding erythema or fevers. She started using warm compresses this morning with some relief.  Symptoms most consistent with superficial thrombophlebitis. Patient recommended for continued warm compresses and tylenol for pain control.  Return precautions advised if no improvement or worsening symptoms
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
## 2021-05-29 NOTE — Telephone Encounter (Signed)
Pt calling on community line.    When I got upstairs I got fine.   They had to remove the IV that was put in from downstairs.   They gave me iron the day before I left.   That arm seems just fine.   Whatever they did downstairs did when they put the IV in. The pain is so bad.  I can't stand to move my arm.  There's a knot there and it's warm and infected looking.     I was in the ER to room 26 in the back.   I was in the ER for a GI bleed and found out I was pregnant while in the ER.   I had the IV put in while in the ER.    It was put in the bend of my elbow.   A gentleman put it in.   I was on morphine so I don't remember too well.   My right arm.   I was in the ER all night.   I finally got upstairs it started hurting.   On the 2-3 bag of iron it started hurting.   It hurts really bad.  No drainage and it hurts really bad.     Reason for Disposition  [1] Skin redness AND [2] extends > 1 inch (2.5 cm) from IV site  Answer Assessment - Initial Assessment Questions 1. SYMPTOM:  "What's the main symptom you're concerned about?" (e.g., pain, redness, swelling, pus)     I was in the ER and had an IV put in the bend of my right arm.   It is now hurting so bad, is red and very sore with a knot in it.   I was admitted to the hospital from the ER.   When I got to my room upstairs they had to take it out because it was hurting.    I can't hardly move my arm because it's hurting so bad.    I don't know what I should do 2. ONSET: "When did the My IV   start?"     In the ER.   They found out I have a GI bleed and that I'm pregnant. 3. IV TYPE: "What kind of IV line do you have?" (e.g., central line, PICC, peripheral IV)     IV in the bend of my arm 4. IV LOCATION - SITE: "Where does the IV enter your body?"     It was in the right arm 5. IV START DATE: "When was this IV put in?"     While in the ER 6. IV REASON: "Why do you have this IV line?"     They gave me iron infusions through it. 7. IV FUNCTION:  "Describe how the IV is running?" (e.g., running normally, running slowly, not running, unable to flush)      It's been removed 8. PAIN: "Is there any pain?" If Yes, ask: "How bad is the pain?" (e.g., scale 1-10; or mild, moderate, severe) "Describe the pain." (e.g., burning, throbbing, shooting, sharp, etc.)   - NONE (0): no pain   - MILD (1-3): doesn't interfere with normal activities    - MODERATE (4-7): interferes with normal activities or awakens from sleep    - SEVERE (8-10): excruciating pain, unable to do any normal activities      Yes severe pain 9. SWELLING: "Is there any swelling at your IV site?"      Yes 10. FEVER: "Do you have  a fever?" If Yes, ask: "What is your temperature, how was it measured, and when did it start?"        Not asked 11. OTHER SYMPTOMS: "Do you have any other symptoms?" (e.g., shaking chills, weakness)       Very pain, red and sore.   I can't hardly move my arm 12. VISITING NURSE: "Do you have a visiting nurse?" (e.g., home health nurse, IV infusion nurse)       Not asked 13. PUMP: "Is it on a pump?" If Yes,, ask: "Is there an alarm and what is the message?"       N/A  Protocols used: IV Site and Other Symptoms-A-AH

## 2021-05-30 ENCOUNTER — Ambulatory Visit
Admission: RE | Admit: 2021-05-30 | Discharge: 2021-05-30 | Disposition: A | Discharge status: Home / Self Care | Payer: Medicaid Other | Source: Ambulatory Visit | Attending: Family Medicine | Admitting: Family Medicine

## 2021-05-30 ENCOUNTER — Inpatient Hospital Stay (HOSPITAL_COMMUNITY)
Admission: AD | Admit: 2021-05-30 | Discharge: 2021-05-30 | Disposition: A | Discharge status: Home / Self Care | Payer: Medicaid Other | Attending: Family Medicine | Admitting: Family Medicine

## 2021-05-30 ENCOUNTER — Other Ambulatory Visit: Payer: Self-pay

## 2021-05-30 ENCOUNTER — Encounter (HOSPITAL_COMMUNITY): Payer: Self-pay | Admitting: Family Medicine

## 2021-05-30 ENCOUNTER — Ambulatory Visit (INDEPENDENT_AMBULATORY_CARE_PROVIDER_SITE_OTHER): Payer: Medicaid Other | Admitting: Obstetrics and Gynecology

## 2021-05-30 ENCOUNTER — Other Ambulatory Visit: Payer: Self-pay | Admitting: Family Medicine

## 2021-05-30 ENCOUNTER — Encounter: Payer: Self-pay | Admitting: Obstetrics and Gynecology

## 2021-05-30 DIAGNOSIS — F17200 Nicotine dependence, unspecified, uncomplicated: Secondary | ICD-10-CM

## 2021-05-30 DIAGNOSIS — O26899 Other specified pregnancy related conditions, unspecified trimester: Secondary | ICD-10-CM | POA: Insufficient documentation

## 2021-05-30 DIAGNOSIS — O3680X0 Pregnancy with inconclusive fetal viability, not applicable or unspecified: Secondary | ICD-10-CM | POA: Insufficient documentation

## 2021-05-30 DIAGNOSIS — Z3A00 Weeks of gestation of pregnancy not specified: Secondary | ICD-10-CM | POA: Diagnosis not present

## 2021-05-30 DIAGNOSIS — F17210 Nicotine dependence, cigarettes, uncomplicated: Secondary | ICD-10-CM | POA: Insufficient documentation

## 2021-05-30 DIAGNOSIS — D259 Leiomyoma of uterus, unspecified: Secondary | ICD-10-CM | POA: Diagnosis not present

## 2021-05-30 DIAGNOSIS — Z3490 Encounter for supervision of normal pregnancy, unspecified, unspecified trimester: Secondary | ICD-10-CM | POA: Diagnosis not present

## 2021-05-30 DIAGNOSIS — L03113 Cellulitis of right upper limb: Secondary | ICD-10-CM | POA: Diagnosis not present

## 2021-05-30 DIAGNOSIS — E349 Endocrine disorder, unspecified: Secondary | ICD-10-CM

## 2021-05-30 DIAGNOSIS — O09529 Supervision of elderly multigravida, unspecified trimester: Secondary | ICD-10-CM | POA: Diagnosis not present

## 2021-05-30 DIAGNOSIS — O99330 Smoking (tobacco) complicating pregnancy, unspecified trimester: Secondary | ICD-10-CM | POA: Diagnosis not present

## 2021-05-30 DIAGNOSIS — O3411 Maternal care for benign tumor of corpus uteri, first trimester: Secondary | ICD-10-CM | POA: Diagnosis not present

## 2021-05-30 LAB — COMPREHENSIVE METABOLIC PANEL
ALT: 25 U/L (ref 0–44)
AST: 28 U/L (ref 15–41)
Albumin: 3.3 g/dL — ABNORMAL LOW (ref 3.5–5.0)
Alkaline Phosphatase: 51 U/L (ref 38–126)
Anion gap: 11 (ref 5–15)
BUN: 13 mg/dL (ref 6–20)
CO2: 19 mmol/L — ABNORMAL LOW (ref 22–32)
Calcium: 8.5 mg/dL — ABNORMAL LOW (ref 8.9–10.3)
Chloride: 104 mmol/L (ref 98–111)
Creatinine, Ser: 1.18 mg/dL — ABNORMAL HIGH (ref 0.44–1.00)
GFR, Estimated: 60 mL/min — ABNORMAL LOW (ref >60)
Glucose, Bld: 162 mg/dL — ABNORMAL HIGH (ref 70–99)
Potassium: 4 mmol/L (ref 3.5–5.1)
Sodium: 134 mmol/L — ABNORMAL LOW (ref 135–145)
Total Bilirubin: 0.3 mg/dL (ref 0.3–1.2)
Total Protein: 7.1 g/dL (ref 6.5–8.1)

## 2021-05-30 LAB — CBC
HCT: 25.9 % — ABNORMAL LOW (ref 36.0–46.0)
Hemoglobin: 7.7 g/dL — ABNORMAL LOW (ref 12.0–15.0)
MCH: 22.4 pg — ABNORMAL LOW (ref 26.0–34.0)
MCHC: 29.7 g/dL — ABNORMAL LOW (ref 30.0–36.0)
MCV: 75.3 fL — ABNORMAL LOW (ref 80.0–100.0)
Platelets: 294 10*3/uL (ref 150–400)
RBC: 3.44 MIL/uL — ABNORMAL LOW (ref 3.87–5.11)
RDW: 21.2 % — ABNORMAL HIGH (ref 11.5–15.5)
WBC: 13 10*3/uL — ABNORMAL HIGH (ref 4.0–10.5)
nRBC: 0 % (ref 0.0–0.2)

## 2021-05-30 LAB — HCG, QUANTITATIVE, PREGNANCY: hCG, Beta Chain, Quant, S: 3940 m[IU]/mL — ABNORMAL HIGH (ref <5)

## 2021-05-30 MED ORDER — DOXYCYCLINE HYCLATE 100 MG PO CAPS: 100.0000 mg | ORAL_CAPSULE | Freq: Two times a day (BID) | ORAL | 0 refills | Status: AC

## 2021-05-30 NOTE — MAU Note (Signed)
...  Jeanette Gamble is a 41 y.o. at Unknown here in MAU reporting: Sent here from office. She states she is unsure why exactly she is here and is unsure of the injection she is being given. She states she was asked if "the pregnancy was something she wanted to continue along with" and she said no so she was sent here. She states after this injection she is supposed to have her fibroids removed and have a hysterectomy. She states she has been experiencing lower abdominal pain for over one week now but states it is "bearable." Denies VB.   Pain score:  8/10 lower abdomen
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
## 2021-05-30 NOTE — Progress Notes (Signed)
Written and verbal d/c instructions given and understanding voiced. 

## 2021-05-30 NOTE — Progress Notes (Signed)
Jeanette Gamble presents for U/S review of pregnancy of unknown location. See prior office notes. BHCG 05/28/21 1200 U/S today no evidence of IUP, normal adnexa  PE AF VSS Lungs clear Heart RRR Abd soft + BS  A/P Pregnancy of unknown location  Reviewed with pt. Pregnancy is not desired. MTHX recommended to pt. Pt is agreeable. Will send to MAU. MAU notified. Pt to return to see me in 4 weeks for follow up and to discuss surgerical management of uterine fibroids.

## 2021-05-31 ENCOUNTER — Inpatient Hospital Stay (HOSPITAL_COMMUNITY)
Admission: AD | Admit: 2021-05-31 | Discharge: 2021-05-31 | Disposition: A | Discharge status: Home / Self Care | Payer: Medicaid Other | Attending: Family Medicine | Admitting: Family Medicine

## 2021-05-31 DIAGNOSIS — O3680X0 Pregnancy with inconclusive fetal viability, not applicable or unspecified: Secondary | ICD-10-CM | POA: Diagnosis not present

## 2021-05-31 LAB — CBC WITH DIFFERENTIAL/PLATELET
Abs Immature Granulocytes: 0.11 10*3/uL — ABNORMAL HIGH (ref 0.00–0.07)
Basophils Absolute: 0 10*3/uL (ref 0.0–0.1)
Basophils Relative: 0 %
Eosinophils Absolute: 0.2 10*3/uL (ref 0.0–0.5)
Eosinophils Relative: 2 %
HCT: 26.6 % — ABNORMAL LOW (ref 36.0–46.0)
Hemoglobin: 7.9 g/dL — ABNORMAL LOW (ref 12.0–15.0)
Immature Granulocytes: 1 %
Lymphocytes Relative: 11 %
Lymphs Abs: 1.4 10*3/uL (ref 0.7–4.0)
MCH: 22.1 pg — ABNORMAL LOW (ref 26.0–34.0)
MCHC: 29.7 g/dL — ABNORMAL LOW (ref 30.0–36.0)
MCV: 74.3 fL — ABNORMAL LOW (ref 80.0–100.0)
Monocytes Absolute: 0.5 10*3/uL (ref 0.1–1.0)
Monocytes Relative: 4 %
Neutro Abs: 10 10*3/uL — ABNORMAL HIGH (ref 1.7–7.7)
Neutrophils Relative %: 82 %
Platelets: 301 10*3/uL (ref 150–400)
RBC: 3.58 MIL/uL — ABNORMAL LOW (ref 3.87–5.11)
RDW: 21.4 % — ABNORMAL HIGH (ref 11.5–15.5)
WBC: 12.2 10*3/uL — ABNORMAL HIGH (ref 4.0–10.5)
nRBC: 0 % (ref 0.0–0.2)

## 2021-05-31 LAB — COMPREHENSIVE METABOLIC PANEL
ALT: 25 U/L (ref 0–44)
AST: 26 U/L (ref 15–41)
Albumin: 3.2 g/dL — ABNORMAL LOW (ref 3.5–5.0)
Alkaline Phosphatase: 48 U/L (ref 38–126)
Anion gap: 11 (ref 5–15)
BUN: 14 mg/dL (ref 6–20)
CO2: 20 mmol/L — ABNORMAL LOW (ref 22–32)
Calcium: 8.6 mg/dL — ABNORMAL LOW (ref 8.9–10.3)
Chloride: 105 mmol/L (ref 98–111)
Creatinine, Ser: 1.19 mg/dL — ABNORMAL HIGH (ref 0.44–1.00)
GFR, Estimated: 59 mL/min — ABNORMAL LOW (ref >60)
Glucose, Bld: 91 mg/dL (ref 70–99)
Potassium: 4.1 mmol/L (ref 3.5–5.1)
Sodium: 136 mmol/L (ref 135–145)
Total Bilirubin: 0.3 mg/dL (ref 0.3–1.2)
Total Protein: 7.5 g/dL (ref 6.5–8.1)

## 2021-05-31 LAB — HCG, QUANTITATIVE, PREGNANCY: hCG, Beta Chain, Quant, S: 5722 m[IU]/mL — ABNORMAL HIGH (ref <5)

## 2021-05-31 MED ORDER — METHOTREXATE FOR ECTOPIC PREGNANCY
50.0000 mg/m2 | Freq: Once | INTRAMUSCULAR | Status: AC
  Administered 2021-05-31: 115 mg via INTRAMUSCULAR
  Filled 2021-05-31: qty 10

## 2021-05-31 NOTE — MAU Provider Note (Signed)
Patient has a uterus without an IUP with HCG level in the discriminatory zone where we should see an IUP. Though her HCG is rising normally, ectopic pregnancies have a normally rising HCG in 15% of cases. Given that we should see at the very least a gestational sac at 1500 and there was nothing with an HCG of 3940 on yesterday. She is a candidate for MTX and should receive this.

## 2021-05-31 NOTE — Discharge Instructions (Signed)
Follow up labwork on Mon, 2/13 and Thursday 2/16 at the office.

## 2021-05-31 NOTE — MAU Note (Signed)
"  ready for this to be done and over, just ready for this to be done and over".  Having pain in mid abd, same place it has been; and in her rt arm(where the IV was).. (pt on phone, calling work is to be there at 2.  Said the dr's office called and told her to come in )

## 2021-06-03 ENCOUNTER — Encounter: Payer: Medicaid Other | Admitting: Internal Medicine

## 2021-06-03 ENCOUNTER — Ambulatory Visit: Payer: Self-pay

## 2021-06-03 ENCOUNTER — Telehealth: Payer: Self-pay

## 2021-06-03 ENCOUNTER — Other Ambulatory Visit: Payer: Self-pay

## 2021-06-03 ENCOUNTER — Ambulatory Visit (INDEPENDENT_AMBULATORY_CARE_PROVIDER_SITE_OTHER): Payer: Medicaid Other | Admitting: *Deleted

## 2021-06-03 VITALS — BP 146/90 | HR 91 | Wt 238.7 lb

## 2021-06-03 DIAGNOSIS — O009 Unspecified ectopic pregnancy without intrauterine pregnancy: Secondary | ICD-10-CM | POA: Diagnosis not present

## 2021-06-03 DIAGNOSIS — R319 Hematuria, unspecified: Secondary | ICD-10-CM

## 2021-06-03 DIAGNOSIS — R109 Unspecified abdominal pain: Secondary | ICD-10-CM | POA: Diagnosis not present

## 2021-06-03 LAB — POCT URINALYSIS DIP (DEVICE)
Bilirubin Urine: NEGATIVE
Glucose, UA: NEGATIVE mg/dL
Ketones, ur: NEGATIVE mg/dL
Leukocytes,Ua: NEGATIVE
Nitrite: NEGATIVE
Protein, ur: NEGATIVE mg/dL
Specific Gravity, Urine: 1.015 (ref 1.005–1.030)
Urobilinogen, UA: 0.2 mg/dL (ref 0.0–1.0)
pH: 5.5 (ref 5.0–8.0)

## 2021-06-03 LAB — BETA HCG QUANT (REF LAB): hCG Quant: 9945 m[IU]/mL

## 2021-06-03 NOTE — Telephone Encounter (Signed)
Called pt to follow up on missed appt today. Pt was not aware of appt. States she can come prior to 2 PM. Does not have transportation. [facility] office notified to arrange transportation and add to schedule for later time if possible.

## 2021-06-03 NOTE — Progress Notes (Signed)
Stat bhcg results received of 9945. I reviewed with Fatima Blank, CNM who advised discuss with Dr. Ilda Basset. Reviewed with Dr. Ilda Basset.  States some rise of bhcg normal but coupled with patient c/o pain advise may advise patient to go to hospital for evaluation.  I called [facility] and left a message as she requested if she did not answer. I left message with result of 9945 and that because of rise and her pain we advise she go to MAU for evaluation asap. Henrietta Cieslewicz,RN

## 2021-06-03 NOTE — Progress Notes (Signed)
Here for stat bhcg day 4 after methotrexate today. C/o pain right flank near where she had her methotrexate shot = 8, states this pain started after she got the shot. States having same pain in pelvis = 7,8 that she has been having since this started. Assessed right flank, no rash or redness noted. UA obtained. Denies bleeding. Will send urine for culture to r/o UTI. Reviewed assessment with Dr. Ilda Basset. No new orders. Explained to patient we will draw stat bhcg and have her leave office. She will be called with results in about 2 hours after results received and reviewed by provider.She states if she does not answer to leave a message and she will check. I informed her it might be that I advise her to go to [facility]; based on results.  Instructed patient if her pain becomes worse she should go to hospital. She voices understanding.   Zachary Nole,RN

## 2021-06-04 ENCOUNTER — Encounter: Payer: Medicaid Other | Admitting: Internal Medicine

## 2021-06-05 LAB — CULTURE, OB URINE

## 2021-06-05 LAB — URINE CULTURE, OB REFLEX

## 2021-06-10 ENCOUNTER — Other Ambulatory Visit: Payer: Self-pay

## 2021-06-10 ENCOUNTER — Telehealth: Payer: Self-pay

## 2021-06-10 ENCOUNTER — Ambulatory Visit (INDEPENDENT_AMBULATORY_CARE_PROVIDER_SITE_OTHER): Payer: Medicaid Other

## 2021-06-10 VITALS — BP 115/76 | HR 93 | Temp 98.5°F | Wt 241.4 lb

## 2021-06-10 DIAGNOSIS — O009 Unspecified ectopic pregnancy without intrauterine pregnancy: Secondary | ICD-10-CM | POA: Diagnosis not present

## 2021-06-10 LAB — BETA HCG QUANT (REF LAB): hCG Quant: 7623 m[IU]/mL

## 2021-06-10 NOTE — Progress Notes (Signed)
Beta HCG Follow-up Visit  Jeanette Gamble presents to [facility] for follow-up beta HCG lab. She was seen in MAU on 05/31/21 and given methotrexate for ectopic pregnancy. Denies any vaginal bleeding since January, including none today. Endorses middle, lower abdominal pain today, rates this 6-7/10. Patient states this is tolerable, has been taking Tylenol and oxycodone. Discussed with patient that we are following beta HCG levels today. Valid contact number for patient confirmed. I will call the patient with results. Patient reports unprotected intercourse x 2 since methotrexate. Explained to patient that we do not recommend intercourse during treatment for ectopic pregnancy.   Beta HCG results: 05/31/21 DAY 1 @ 13:21 6629  06/03/21 DAY 4 @ 11:20 9945  06/10/21 @ 10:08 7623   Results and patient history reviewed with [name], MD, who states this shows methotrexate was successful and patient does not need second dose. Recommends patient follow up weekly until less than 5. Sent MyChart message to contact patient. Pt requested message if after 2 PM.   [name] 06/10/2021 10:06 AM

## 2021-06-10 NOTE — Telephone Encounter (Addendum)
-----   Message from Aletha Halim, MD sent at 06/10/2021  8:34 AM EST ----- Regarding: i don't see she has follow up hCGs after her methotrexate... Needs one asap for stat beta. thank   Per chart review patient received methotrexate on 05/31/21 for ectopic pregnancy. Day 4 beta HCG showed increase from 5722 on 05/31/21 to 9945 on 06/03/21. Due to pain, [facility]'s MD recommended pt return to MAU.   Called pt; pt states she did not go to MAU because she could not miss any more work. Pt reports continued pain 7/10. Explained this is very critical and can even be life threatening if not treated. Explained pt needs to come to office today or report to MAU after work if she is unable to come during the day. Pt asks if she can come in on Wednesday. Reiterated we do not recommend waiting any longer. Pt states she does not have transportation. Transportation arranged and pt agreeable to appt this AM at 0940.

## 2021-06-10 NOTE — Progress Notes (Signed)
Patient was assessed and managed by nursing staff during this encounter. I have reviewed the chart and agree with the documentation and plan. I have also made any necessary editorial changes.  Aletha Halim, MD 06/10/2021 8:40 AM

## 2021-06-11 ENCOUNTER — Telehealth: Payer: Self-pay

## 2021-06-11 ENCOUNTER — Encounter: Payer: Medicaid Other | Admitting: Internal Medicine

## 2021-06-11 NOTE — Telephone Encounter (Signed)
Called pt to follow up on MyChart message sent yesterday. Pt states she has read this message. Lab appointment scheduled for next Monday.

## 2021-06-13 ENCOUNTER — Encounter: Payer: Self-pay | Admitting: Internal Medicine

## 2021-06-17 ENCOUNTER — Other Ambulatory Visit: Payer: Self-pay

## 2021-06-17 ENCOUNTER — Other Ambulatory Visit: Payer: Medicaid Other

## 2021-06-17 DIAGNOSIS — O009 Unspecified ectopic pregnancy without intrauterine pregnancy: Secondary | ICD-10-CM

## 2021-06-19 ENCOUNTER — Encounter: Payer: Medicaid Other | Admitting: Internal Medicine

## 2021-06-19 ENCOUNTER — Other Ambulatory Visit: Payer: Medicaid Other

## 2021-06-24 ENCOUNTER — Other Ambulatory Visit: Payer: Self-pay | Admitting: Internal Medicine

## 2021-06-24 DIAGNOSIS — M545 Low back pain, unspecified: Secondary | ICD-10-CM

## 2021-06-26 ENCOUNTER — Encounter: Payer: Medicaid Other | Admitting: Internal Medicine

## 2021-06-27 ENCOUNTER — Other Ambulatory Visit: Payer: Self-pay

## 2021-06-27 ENCOUNTER — Inpatient Hospital Stay (HOSPITAL_COMMUNITY): Payer: Medicaid Other

## 2021-06-27 ENCOUNTER — Encounter (HOSPITAL_COMMUNITY)
Admission: AD | Disposition: A | Discharge status: Home / Self Care | Payer: Self-pay | Source: Home / Self Care | Attending: Obstetrics and Gynecology

## 2021-06-27 ENCOUNTER — Inpatient Hospital Stay (HOSPITAL_COMMUNITY): Payer: Medicaid Other | Admitting: Certified Registered Nurse Anesthetist

## 2021-06-27 ENCOUNTER — Encounter (HOSPITAL_COMMUNITY): Payer: Self-pay | Admitting: Obstetrics and Gynecology

## 2021-06-27 ENCOUNTER — Ambulatory Visit (HOSPITAL_COMMUNITY)
Admission: AD | Admit: 2021-06-27 | Discharge: 2021-06-28 | Disposition: A | Discharge status: Home / Self Care | Payer: Medicaid Other | Attending: Obstetrics and Gynecology | Admitting: Obstetrics and Gynecology

## 2021-06-27 ENCOUNTER — Inpatient Hospital Stay (HOSPITAL_BASED_OUTPATIENT_CLINIC_OR_DEPARTMENT_OTHER): Payer: Medicaid Other | Admitting: Certified Registered Nurse Anesthetist

## 2021-06-27 DIAGNOSIS — Z20822 Contact with and (suspected) exposure to covid-19: Secondary | ICD-10-CM | POA: Insufficient documentation

## 2021-06-27 DIAGNOSIS — O0739 Failed attempted termination of pregnancy with other complications: Secondary | ICD-10-CM | POA: Insufficient documentation

## 2021-06-27 DIAGNOSIS — D251 Intramural leiomyoma of uterus: Secondary | ICD-10-CM | POA: Insufficient documentation

## 2021-06-27 DIAGNOSIS — N838 Other noninflammatory disorders of ovary, fallopian tube and broad ligament: Secondary | ICD-10-CM | POA: Insufficient documentation

## 2021-06-27 DIAGNOSIS — O009 Unspecified ectopic pregnancy without intrauterine pregnancy: Secondary | ICD-10-CM | POA: Diagnosis not present

## 2021-06-27 DIAGNOSIS — N858 Other specified noninflammatory disorders of uterus: Secondary | ICD-10-CM | POA: Diagnosis not present

## 2021-06-27 DIAGNOSIS — Z302 Encounter for sterilization: Secondary | ICD-10-CM | POA: Diagnosis not present

## 2021-06-27 DIAGNOSIS — N189 Chronic kidney disease, unspecified: Secondary | ICD-10-CM | POA: Insufficient documentation

## 2021-06-27 DIAGNOSIS — O10219 Pre-existing hypertensive chronic kidney disease complicating pregnancy, unspecified trimester: Secondary | ICD-10-CM | POA: Insufficient documentation

## 2021-06-27 DIAGNOSIS — Z3A00 Weeks of gestation of pregnancy not specified: Secondary | ICD-10-CM | POA: Insufficient documentation

## 2021-06-27 DIAGNOSIS — R109 Unspecified abdominal pain: Secondary | ICD-10-CM | POA: Insufficient documentation

## 2021-06-27 DIAGNOSIS — O26891 Other specified pregnancy related conditions, first trimester: Secondary | ICD-10-CM | POA: Diagnosis not present

## 2021-06-27 DIAGNOSIS — O3680X0 Pregnancy with inconclusive fetal viability, not applicable or unspecified: Secondary | ICD-10-CM | POA: Diagnosis not present

## 2021-06-27 DIAGNOSIS — K219 Gastro-esophageal reflux disease without esophagitis: Secondary | ICD-10-CM | POA: Insufficient documentation

## 2021-06-27 DIAGNOSIS — I129 Hypertensive chronic kidney disease with stage 1 through stage 4 chronic kidney disease, or unspecified chronic kidney disease: Secondary | ICD-10-CM | POA: Insufficient documentation

## 2021-06-27 DIAGNOSIS — O99619 Diseases of the digestive system complicating pregnancy, unspecified trimester: Secondary | ICD-10-CM | POA: Insufficient documentation

## 2021-06-27 DIAGNOSIS — D259 Leiomyoma of uterus, unspecified: Secondary | ICD-10-CM | POA: Diagnosis not present

## 2021-06-27 DIAGNOSIS — O99210 Obesity complicating pregnancy, unspecified trimester: Secondary | ICD-10-CM | POA: Insufficient documentation

## 2021-06-27 DIAGNOSIS — O3410 Maternal care for benign tumor of corpus uteri, unspecified trimester: Secondary | ICD-10-CM | POA: Insufficient documentation

## 2021-06-27 DIAGNOSIS — F17210 Nicotine dependence, cigarettes, uncomplicated: Secondary | ICD-10-CM | POA: Insufficient documentation

## 2021-06-27 DIAGNOSIS — O99330 Smoking (tobacco) complicating pregnancy, unspecified trimester: Secondary | ICD-10-CM | POA: Insufficient documentation

## 2021-06-27 HISTORY — PX: LAPAROSCOPIC BILATERAL SALPINGECTOMY: SHX5889

## 2021-06-27 HISTORY — PX: DILATION AND EVACUATION: SHX1459

## 2021-06-27 LAB — COMPREHENSIVE METABOLIC PANEL
ALT: 21 U/L (ref 0–44)
AST: 22 U/L (ref 15–41)
Albumin: 3.7 g/dL (ref 3.5–5.0)
Alkaline Phosphatase: 57 U/L (ref 38–126)
Anion gap: 9 (ref 5–15)
BUN: 18 mg/dL (ref 6–20)
CO2: 20 mmol/L — ABNORMAL LOW (ref 22–32)
Calcium: 8.8 mg/dL — ABNORMAL LOW (ref 8.9–10.3)
Chloride: 106 mmol/L (ref 98–111)
Creatinine, Ser: 1.29 mg/dL — ABNORMAL HIGH (ref 0.44–1.00)
GFR, Estimated: 53 mL/min — ABNORMAL LOW (ref >60)
Glucose, Bld: 100 mg/dL — ABNORMAL HIGH (ref 70–99)
Potassium: 4.2 mmol/L (ref 3.5–5.1)
Sodium: 135 mmol/L (ref 135–145)
Total Bilirubin: 0.2 mg/dL — ABNORMAL LOW (ref 0.3–1.2)
Total Protein: 7.5 g/dL (ref 6.5–8.1)

## 2021-06-27 LAB — CBC
HCT: 35.3 % — ABNORMAL LOW (ref 36.0–46.0)
Hemoglobin: 10.7 g/dL — ABNORMAL LOW (ref 12.0–15.0)
MCH: 25.4 pg — ABNORMAL LOW (ref 26.0–34.0)
MCHC: 30.3 g/dL (ref 30.0–36.0)
MCV: 83.6 fL (ref 80.0–100.0)
Platelets: 223 10*3/uL (ref 150–400)
RBC: 4.22 MIL/uL (ref 3.87–5.11)
WBC: 12.1 10*3/uL — ABNORMAL HIGH (ref 4.0–10.5)
nRBC: 0 % (ref 0.0–0.2)

## 2021-06-27 LAB — TYPE AND SCREEN
ABO/RH(D): O POS
Antibody Screen: NEGATIVE

## 2021-06-27 LAB — RESP PANEL BY RT-PCR (FLU A&B, COVID) ARPGX2
Influenza A by PCR: NEGATIVE
Influenza B by PCR: NEGATIVE
SARS Coronavirus 2 by RT PCR: NEGATIVE

## 2021-06-27 LAB — HCG, QUANTITATIVE, PREGNANCY: hCG, Beta Chain, Quant, S: 5047 m[IU]/mL — ABNORMAL HIGH (ref <5)

## 2021-06-27 SURGERY — DILATION AND EVACUATION, UTERUS
Anesthesia: General

## 2021-06-27 MED ORDER — PROPOFOL 10 MG/ML IV BOLUS
INTRAVENOUS | Status: AC
  Filled 2021-06-27: qty 20

## 2021-06-27 MED ORDER — LIDOCAINE 2% (20 MG/ML) 5 ML SYRINGE
INTRAMUSCULAR | Status: DC | PRN
  Administered 2021-06-27: 60 mg via INTRAVENOUS

## 2021-06-27 MED ORDER — DEXAMETHASONE SODIUM PHOSPHATE 10 MG/ML IJ SOLN
INTRAMUSCULAR | Status: DC | PRN
  Administered 2021-06-27: 10 mg via INTRAVENOUS

## 2021-06-27 MED ORDER — PROPOFOL 10 MG/ML IV BOLUS
INTRAVENOUS | Status: DC | PRN
  Administered 2021-06-27: 200 mg via INTRAVENOUS

## 2021-06-27 MED ORDER — ONDANSETRON HCL 4 MG/2ML IJ SOLN
INTRAMUSCULAR | Status: AC
  Filled 2021-06-27: qty 2

## 2021-06-27 MED ORDER — LACTATED RINGERS IV SOLN: INTRAVENOUS | Status: DC

## 2021-06-27 MED ORDER — FENTANYL CITRATE (PF) 250 MCG/5ML IJ SOLN
INTRAMUSCULAR | Status: DC | PRN
  Administered 2021-06-27: 150 ug via INTRAVENOUS
  Administered 2021-06-27 (×3): 50 ug via INTRAVENOUS

## 2021-06-27 MED ORDER — DIPHENHYDRAMINE HCL 50 MG/ML IJ SOLN
INTRAMUSCULAR | Status: DC | PRN
  Administered 2021-06-27: 12.5 mg via INTRAVENOUS

## 2021-06-27 MED ORDER — BUPIVACAINE HCL (PF) 0.25 % IJ SOLN
INTRAMUSCULAR | Status: DC | PRN
  Administered 2021-06-27: 15 mL

## 2021-06-27 MED ORDER — ONDANSETRON HCL 4 MG/2ML IJ SOLN
INTRAMUSCULAR | Status: DC | PRN
  Administered 2021-06-27: 4 mg via INTRAVENOUS

## 2021-06-27 MED ORDER — PROMETHAZINE HCL 25 MG/ML IJ SOLN: 6.2500 mg | INTRAMUSCULAR | Status: DC | PRN

## 2021-06-27 MED ORDER — 0.9 % SODIUM CHLORIDE (POUR BTL) OPTIME
TOPICAL | Status: DC | PRN
Start: 2021-06-27 — End: 2021-06-27
  Administered 2021-06-27: 21:00:00 1000 mL

## 2021-06-27 MED ORDER — SODIUM CHLORIDE 0.9 % IR SOLN
Status: DC | PRN
  Administered 2021-06-27: 3000 mL

## 2021-06-27 MED ORDER — ORAL CARE MOUTH RINSE: 15.0000 mL | Freq: Once | OROMUCOSAL | Status: AC

## 2021-06-27 MED ORDER — DOXYCYCLINE HYCLATE 100 MG IV SOLR
200.0000 mg | INTRAVENOUS | Status: AC
  Administered 2021-06-27: 19:00:00 200 mg via INTRAVENOUS
  Filled 2021-06-27 (×2): qty 200

## 2021-06-27 MED ORDER — SUCCINYLCHOLINE CHLORIDE 200 MG/10ML IV SOSY
PREFILLED_SYRINGE | INTRAVENOUS | Status: AC
  Filled 2021-06-27: qty 10

## 2021-06-27 MED ORDER — CHLORHEXIDINE GLUCONATE 0.12 % MT SOLN: 15.0000 mL | Freq: Once | OROMUCOSAL | Status: AC

## 2021-06-27 MED ORDER — LACTATED RINGERS IV SOLN
INTRAVENOUS | Status: DC
Start: 2021-06-27 — End: 2021-06-28

## 2021-06-27 MED ORDER — MIDAZOLAM HCL 2 MG/2ML IJ SOLN
INTRAMUSCULAR | Status: DC | PRN
  Administered 2021-06-27: 2 mg via INTRAVENOUS

## 2021-06-27 MED ORDER — ROCURONIUM BROMIDE 10 MG/ML (PF) SYRINGE
PREFILLED_SYRINGE | INTRAVENOUS | Status: AC
  Filled 2021-06-27: qty 10

## 2021-06-27 MED ORDER — ACETAMINOPHEN 10 MG/ML IV SOLN
INTRAVENOUS | Status: AC
  Filled 2021-06-27: qty 100

## 2021-06-27 MED ORDER — HYDROMORPHONE HCL 1 MG/ML IJ SOLN
1.0000 mg | Freq: Once | INTRAMUSCULAR | Status: AC
  Administered 2021-06-27: 17:00:00 1 mg via INTRAVENOUS
  Filled 2021-06-27: qty 1

## 2021-06-27 MED ORDER — FENTANYL CITRATE (PF) 100 MCG/2ML IJ SOLN
INTRAMUSCULAR | Status: AC
  Filled 2021-06-27: qty 2

## 2021-06-27 MED ORDER — KETOROLAC TROMETHAMINE 30 MG/ML IJ SOLN
INTRAMUSCULAR | Status: AC
  Filled 2021-06-27: qty 1

## 2021-06-27 MED ORDER — METHYLERGONOVINE MALEATE 0.2 MG/ML IJ SOLN
INTRAMUSCULAR | Status: AC
  Filled 2021-06-27: qty 1

## 2021-06-27 MED ORDER — DEXMEDETOMIDINE (PRECEDEX) IN NS 20 MCG/5ML (4 MCG/ML) IV SYRINGE
PREFILLED_SYRINGE | INTRAVENOUS | Status: DC | PRN
  Administered 2021-06-27: 12 ug via INTRAVENOUS
  Administered 2021-06-27: 8 ug via INTRAVENOUS

## 2021-06-27 MED ORDER — KETOROLAC TROMETHAMINE 30 MG/ML IJ SOLN
INTRAMUSCULAR | Status: DC | PRN
  Administered 2021-06-27: 30 mg via INTRAVENOUS

## 2021-06-27 MED ORDER — DEXMEDETOMIDINE (PRECEDEX) IN NS 20 MCG/5ML (4 MCG/ML) IV SYRINGE
PREFILLED_SYRINGE | INTRAVENOUS | Status: AC
  Filled 2021-06-27: qty 5

## 2021-06-27 MED ORDER — LABETALOL HCL 5 MG/ML IV SOLN
INTRAVENOUS | Status: DC | PRN
  Administered 2021-06-27: 5 mg via INTRAVENOUS

## 2021-06-27 MED ORDER — ACETAMINOPHEN 10 MG/ML IV SOLN
1000.0000 mg | Freq: Once | INTRAVENOUS | Status: DC | PRN
  Administered 2021-06-27: 21:00:00 1000 mg via INTRAVENOUS

## 2021-06-27 MED ORDER — TRANEXAMIC ACID-NACL 1000-0.7 MG/100ML-% IV SOLN
INTRAVENOUS | Status: DC | PRN
  Administered 2021-06-27: 1000 mg via INTRAVENOUS

## 2021-06-27 MED ORDER — EPHEDRINE 5 MG/ML INJ
INTRAVENOUS | Status: AC
  Filled 2021-06-27: qty 5

## 2021-06-27 MED ORDER — AMISULPRIDE (ANTIEMETIC) 5 MG/2ML IV SOLN
INTRAVENOUS | Status: AC
  Filled 2021-06-27: qty 4

## 2021-06-27 MED ORDER — MIDAZOLAM HCL 2 MG/2ML IJ SOLN
INTRAMUSCULAR | Status: AC
  Filled 2021-06-27: qty 2

## 2021-06-27 MED ORDER — CHLORHEXIDINE GLUCONATE 0.12 % MT SOLN
OROMUCOSAL | Status: AC
  Administered 2021-06-27: 19:00:00 15 mL via OROMUCOSAL
  Filled 2021-06-27: qty 15

## 2021-06-27 MED ORDER — TRANEXAMIC ACID-NACL 1000-0.7 MG/100ML-% IV SOLN
INTRAVENOUS | Status: AC
  Filled 2021-06-27: qty 100

## 2021-06-27 MED ORDER — LABETALOL HCL 5 MG/ML IV SOLN
INTRAVENOUS | Status: AC
  Filled 2021-06-27: qty 4

## 2021-06-27 MED ORDER — FENTANYL CITRATE (PF) 250 MCG/5ML IJ SOLN
INTRAMUSCULAR | Status: AC
  Filled 2021-06-27: qty 5

## 2021-06-27 MED ORDER — ROCURONIUM BROMIDE 10 MG/ML (PF) SYRINGE
PREFILLED_SYRINGE | INTRAVENOUS | Status: DC | PRN
  Administered 2021-06-27: 40 mg via INTRAVENOUS
  Administered 2021-06-27: 10 mg via INTRAVENOUS
  Administered 2021-06-27: 20 mg via INTRAVENOUS

## 2021-06-27 MED ORDER — METHYLERGONOVINE MALEATE 0.2 MG/ML IJ SOLN
INTRAMUSCULAR | Status: DC | PRN
  Administered 2021-06-27: .2 mg via INTRAMUSCULAR

## 2021-06-27 MED ORDER — LIDOCAINE 2% (20 MG/ML) 5 ML SYRINGE
INTRAMUSCULAR | Status: AC
  Filled 2021-06-27: qty 5

## 2021-06-27 MED ORDER — OXYCODONE HCL 5 MG PO TABS
5.0000 mg | ORAL_TABLET | ORAL | 0 refills | Status: DC | PRN
Start: 2021-06-27 — End: 2021-06-28

## 2021-06-27 MED ORDER — ONDANSETRON 4 MG PO TBDP: 4.0000 mg | ORAL_TABLET | Freq: Four times a day (QID) | ORAL | 0 refills | Status: DC | PRN

## 2021-06-27 MED ORDER — LACTATED RINGERS IV BOLUS
1000.0000 mL | Freq: Once | INTRAVENOUS | Status: AC
  Administered 2021-06-27: 17:00:00 1000 mL via INTRAVENOUS

## 2021-06-27 MED ORDER — BUPIVACAINE HCL (PF) 0.25 % IJ SOLN
INTRAMUSCULAR | Status: AC
  Filled 2021-06-27: qty 30

## 2021-06-27 MED ORDER — FENTANYL CITRATE (PF) 100 MCG/2ML IJ SOLN: 25.0000 ug | INTRAMUSCULAR | Status: DC | PRN

## 2021-06-27 MED ORDER — SODIUM CHLORIDE (PF) 0.9 % IJ SOLN
INTRAMUSCULAR | Status: DC | PRN
  Administered 2021-06-27: 10 mL via INTRAVENOUS

## 2021-06-27 MED ORDER — SUGAMMADEX SODIUM 200 MG/2ML IV SOLN
INTRAVENOUS | Status: DC | PRN
  Administered 2021-06-27: 100 mg via INTRAVENOUS
  Administered 2021-06-27: 250 mg via INTRAVENOUS

## 2021-06-27 MED ORDER — EPHEDRINE SULFATE-NACL 50-0.9 MG/10ML-% IV SOSY
PREFILLED_SYRINGE | INTRAVENOUS | Status: DC | PRN
  Administered 2021-06-27 (×2): 5 mg via INTRAVENOUS

## 2021-06-27 MED ORDER — ONDANSETRON HCL 4 MG/2ML IJ SOLN
4.0000 mg | Freq: Once | INTRAMUSCULAR | Status: AC
  Administered 2021-06-27: 17:00:00 4 mg via INTRAVENOUS
  Filled 2021-06-27: qty 2

## 2021-06-27 MED ORDER — DEXAMETHASONE SODIUM PHOSPHATE 10 MG/ML IJ SOLN
INTRAMUSCULAR | Status: AC
  Filled 2021-06-27: qty 1

## 2021-06-27 MED ORDER — SUCCINYLCHOLINE CHLORIDE 200 MG/10ML IV SOSY
PREFILLED_SYRINGE | INTRAVENOUS | Status: DC | PRN
Start: 2021-06-27 — End: 2021-06-27
  Administered 2021-06-27: 100 mg via INTRAVENOUS

## 2021-06-27 MED ORDER — AMISULPRIDE (ANTIEMETIC) 5 MG/2ML IV SOLN
10.0000 mg | Freq: Once | INTRAVENOUS | Status: AC | PRN
  Administered 2021-06-27: 22:00:00 10 mg via INTRAVENOUS

## 2021-06-27 MED ORDER — SODIUM CHLORIDE (PF) 0.9 % IJ SOLN
INTRAMUSCULAR | Status: AC
  Filled 2021-06-27: qty 10

## 2021-06-27 SURGICAL SUPPLY — 49 items
ADH SKN CLS APL DERMABOND .7 (GAUZE/BANDAGES/DRESSINGS) ×2
BAG COUNTER SPONGE SURGICOUNT (BAG) ×3 IMPLANT
BAG SPEC RTRVL LRG 6X4 10 (ENDOMECHANICALS) ×2
BAG SPNG CNTER NS LX DISP (BAG) ×2
CABLE HIGH FREQUENCY MONO STRZ (ELECTRODE) ×1 IMPLANT
DEFOGGER SCOPE WARMER CLEARIFY (MISCELLANEOUS) ×1 IMPLANT
DERMABOND ADVANCED (GAUZE/BANDAGES/DRESSINGS) ×1
DERMABOND ADVANCED .7 DNX12 (GAUZE/BANDAGES/DRESSINGS) ×2 IMPLANT
DURAPREP 26ML APPLICATOR (WOUND CARE) ×3 IMPLANT
FILTER UTR ASPR ASSEMBLY (MISCELLANEOUS) ×3 IMPLANT
GAUZE 4X4 16PLY ~~LOC~~+RFID DBL (SPONGE) ×3 IMPLANT
GLOVE SURG ENC MOIS LTX SZ6 (GLOVE) ×3 IMPLANT
GLOVE SURG UNDER LTX SZ6.5 (GLOVE) ×12 IMPLANT
GLOVE SURG UNDER POLY LF SZ6.5 (GLOVE) ×3 IMPLANT
GLOVE SURG UNDER POLY LF SZ7 (GLOVE) ×3 IMPLANT
GOWN STRL REUS W/ TWL LRG LVL3 (GOWN DISPOSABLE) ×6 IMPLANT
GOWN STRL REUS W/TWL LRG LVL3 (GOWN DISPOSABLE) ×9
HOSE CONNECTING 18IN BERKELEY (TUBING) ×3 IMPLANT
IRRIG SUCT STRYKERFLOW 2 WTIP (MISCELLANEOUS) ×3
IRRIGATION SUCT STRKRFLW 2 WTP (MISCELLANEOUS) IMPLANT
KIT BERKELEY 1ST TRI 3/8 NO TR (MISCELLANEOUS) ×3 IMPLANT
KIT BERKELEY 1ST TRIMESTER 3/8 (MISCELLANEOUS) ×3 IMPLANT
KIT TURNOVER KIT B (KITS) ×3 IMPLANT
NDL INSUFFLATION 14GA 120MM (NEEDLE) ×2 IMPLANT
NEEDLE INSUFFLATION 14GA 120MM (NEEDLE) ×3 IMPLANT
NS IRRIG 1000ML POUR BTL (IV SOLUTION) ×3 IMPLANT
PACK LAPAROSCOPY BASIN (CUSTOM PROCEDURE TRAY) ×3 IMPLANT
PACK VAGINAL MINOR WOMEN LF (CUSTOM PROCEDURE TRAY) ×3 IMPLANT
PAD OB MATERNITY 4.3X12.25 (PERSONAL CARE ITEMS) ×3 IMPLANT
POUCH LAPAROSCOPIC INSTRUMENT (MISCELLANEOUS) ×3 IMPLANT
POUCH SPECIMEN RETRIEVAL 10MM (ENDOMECHANICALS) ×1 IMPLANT
PROTECTOR NERVE ULNAR (MISCELLANEOUS) ×6 IMPLANT
SEALER TISSUE G2 CVD JAW 35 (ENDOMECHANICALS) IMPLANT
SEALER TISSUE G2 CVD JAW 45CM (ENDOMECHANICALS) ×3
SET BERKELEY SUCTION TUBING (SUCTIONS) ×3 IMPLANT
SUT VICRYL 0 UR6 27IN ABS (SUTURE) ×1 IMPLANT
SUT VICRYL 4-0 PS2 18IN ABS (SUTURE) ×3 IMPLANT
SYSTEM CARTER THOMASON II (TROCAR) IMPLANT
TOWEL GREEN STERILE FF (TOWEL DISPOSABLE) ×6 IMPLANT
TRAY FOLEY W/BAG SLVR 14FR (SET/KITS/TRAYS/PACK) ×3 IMPLANT
TROCAR ADV FIXATION 5X100MM (TROCAR) ×3 IMPLANT
TROCAR XCEL NON-BLD 5MMX100MML (ENDOMECHANICALS) ×7 IMPLANT
TUBING CONNECTING 10 (TUBING) ×1 IMPLANT
UNDERPAD 30X36 HEAVY ABSORB (UNDERPADS AND DIAPERS) ×3 IMPLANT
VACURETTE 6 ASPIR F TIP BERK (CANNULA) ×1 IMPLANT
VACURETTE 7MM CVD STRL WRAP (CANNULA) ×1 IMPLANT
VACURETTE 8 RIGID CVD (CANNULA) ×1 IMPLANT
WARMER LAPAROSCOPE (MISCELLANEOUS) ×3 IMPLANT
YANKAUER SUCT BULB TIP NO VENT (SUCTIONS) ×1 IMPLANT
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
## 2021-06-27 NOTE — MAU Provider Note (Signed)
MAU Note ?06/27/2021 ?Time: 1694 ?I was called to see patient re: concern for ruptured ectopic. Patient presents for worsening diffuse abdominal pain and VB (stable) that started on 3/7. History: ?HCGs ?2/3 330 ?2/4 361 ?2/5 466 ?2/7 1200 ?2/9 3940 ?2/10 5722 Methotrexate given ?2/13 9945 ?2/20 7623 ? ?Patient states she missed her 2/27 and 3/1 HCG surveillance visits. NPO since yesterday. Bedside u/s images reviewed and difficult to really see anything due to large fibroids. U/s tech brought back and TVUS and TAUS done. Really difficult to see anything except for large fibroids to about 22-24wk level. Small amount of FF in the pelvis but none elsewhere. Mildly ttp in midline over a fibroid but no peritoneal s/s.  ? ?AF VS normal and stable with HR in the 60s and BP in the 150s-160s SBP. Pt states she hasn't taken her BP medication at least since yesterday.  ? ?IV team and anesthesia called to help with IV placement ? ?HCG just came back at 5047, Cr stable at 1.29 and CBC pending ? ?I told her to stay NPO but good chance we may need to take her to the OR for more diagnostic testing b/c hard to tell from presentation if she has an ectopic, SAB in progress, degenerating fibroid, combination of ones that could be causing her s/s.  ? ?Durene Romans MD ?Attending ?[facility] for [employer] [facility]) ?GYN Consult Phone: [phone] (M-F, [phone]) & [phone] (Off hours, weekends, holidays) ? ?

## 2021-06-27 NOTE — MAU Note (Addendum)
RN gave report to P[place]et from [place]  ? ? ?

## 2021-06-27 NOTE — Op Note (Signed)
[facility] ?PROCEDURE DATE: 06/27/2021 ? ? ?PREOPERATIVE DIAGNOSIS:  Pregnancy of unknown location, Unsuccessful methotrexate, Poor compliance with MTX follow up, Fibroids ? ?POSTOPERATIVE DIAGNOSIS:  Pregnancy of unknown location, Unsuccessful methotrexate, Poor compliance with MTX follow up, Fibroids ? ?PROCEDURE:  Dilation and evacuation under US guidance, Laparoscopic Bilateral Salpingectomy ? ?SURGEON:  Dr. Radene Gunning ? ?ASSISTANT:  Dr. Nelda Marseille. An experienced assistant was required given the standard of surgical care given the complexity of the case.  This assistant was needed for exposure, dissection, suctioning, retraction, instrument exchange, and for overall help during the procedure. ? ?ANESTHESIA:  General endotracheal, 15 cc 0.25% marcaine in the laparoscopic incisions ? ?COMPLICATIONS:  None immediate. ? ?ESTIMATED BLOOD LOSS:  200 ml. ? ?FLUIDS: 1200 ml LR. ? ?URINE OUTPUT:  450 ml of clear urine. ? ?INDICATIONS: 41 y.o. G2P0101 with a pregnancy of unknown location who also does not wish to have any future fertility. She was given methotrexate and she did not follow up as directed and then was lost to follow up after 06/10/2021. Additionally, she has chronic kidney disease and thus would not be a good candidate for repeated MTX. She also has substantial fibroid burden and ultimately desires a hysterectomy.   Written informed consent was obtained. ?   ?FINDINGS:  Copious endometrial tissue noted on the pad and after vaginal prep (as if it came out en bloc). Minimal tissue on US guided D&E. Normal appearing external female genitalia, normal appearing cervix. Her uterine fundus reached to the level of about 4 cm above the umbilicus. She had minimal adhesive disease and her uterine mobility was limited by her fibroids. Her right tube appeared like it possibly contained an ectopic pregnancy but was not definitive and given her history/wishes, we removed both tubes as a precaution. Upper abdominal  inspection revealed normal appearing colon, stomach, gall bladder and liver. We were able to visualize the bladder and it appeared to have minimal scar tissue from prior c-section.  ? ?TECHNIQUE:  The patient was taken to the operating room where general anesthesia was obtained without difficulty.  She was then placed in the dorsal lithotomy position.  Prior to the procedure there was substantial tissue noted on her pad prior to vaginal prep and following it. This initial tissue was sent for frozen specimen as some appeared potentially c/w products of conception.  She was prepared and draped in sterile fashion. Preop antibiotics of doxycycline given. After an adequate timeout was performed.  ? ?A bivalved speculum was then placed in the patient's vagina, and the anterior lip of cervix grasped with the single-tooth tenaculum.  Cervix progressively dilated to a 19 pratt.  I used a 6 mm flexible curette. Several passes done with the suction curette and the minimal tissue was retrieved and this was all performed under US guidance. No intrauterine pregnancy tissue was visualized. This tissue was sent to pathology for frozen specimen. During this time she was noted to have copious bleeding from the cervix. She was given one dose of methergine and one dose of TXA and pressure applied with the raytec sponge stick. The bleeding responded well to these medications and stopped. She is Rh positive. While awaiting pathology results, a catheter was placed and the patient was placed in dorsal supine position for likely laparoscopy with her arms and hands tucked and cushioned. Pathology called into the OR and confirmed no villi on any samples provided and that the tissue from the pad and prep was consistent with endometrium.  ? ?  An OG was placed by the CRNA as we planned LUQ entry. Palmer's point marked.  A skin incision was made with the 11 blade scalpel. I entered her abdominal cavity with a veress needle using closed technique.  Opening pressure was 5.  Pneumoperitoneum achieved to a pressure of 15.  The 5 mm optiview port was inserted into the abdomen under direct visualization. Below the point of entry and the pelvis was inspected with no evidence of injury. Primary visualization was done with the 30 degree 20m laparoscope.  ? ?The scalpel was then used to make two incisions, in the left and right lateral abdomen at the level of the umbilicus. A port was also placed in the RUQ below the level of the liver. All of these ports were placed under direct visualization. The left-most post was an [id number]. The abdomen and pelvis were inspected. She had some adhesions of the omentum to the anterior abdominal wall and to the left ovary and these were easily removed via the Enseal.  Both tubes were able to be visualized and it appeared possible that the right tube contained an ectopic pregnancy but was not definitive. The fallopian tubes were cauterized, cut, and detached from their surrounding pelvic structures with the Enseal. No bleeding was noted. As a precaution, Arista was used on the right tube but we were unable to apply to the left as the apparatus became clogged. The specimens were removed through the [id number]. We inspected anterior to the uterus, the upper abdomen and all around the uterus to ensure no other possibility for location for ectopic pregnancy. We could not fully visualize the posterior cul-de-sac due to it being entire filled by the fibroids. Both tubal surgery sites were inspected and noted to be hemostatic at the end of the procedure. The 5mm63mrts removed and gas was reduced from the abdomen. The 66mm 666mt was then removed. The skin was then closed in a subcuticular fashion with 4-0 vicryl and dermabond was placed. ? ?The patient will be discharged to home as per PACU criteria.  Routine postoperative instructions given.   ? ?A message was sent to [email or web address], incision check and ultimately a preoperative  appointment for discussion regarding hysterectomy.  ? ?

## 2021-06-27 NOTE — Anesthesia Preprocedure Evaluation (Signed)
Anesthesia Evaluation  ?Patient identified by MRN, date of birth, ID band ?Patient awake ? ? ? ?Reviewed: ?Allergy & Precautions, NPO status , Patient's Chart, lab work & pertinent test results ? ?Airway ?Mallampati: II ? ?TM Distance: >3 FB ?Neck ROM: Full ? ? ? Dental ?no notable dental hx. ? ?  ?Pulmonary ?Current Smoker,  ?  ?Pulmonary exam normal ?breath sounds clear to auscultation ? ? ? ? ? ? Cardiovascular ?hypertension, Pt. on medications ?Normal cardiovascular exam ?Rhythm:Regular Rate:Normal ? ? ?  ?Neuro/Psych ? Headaches, PSYCHIATRIC DISORDERS Depression  Neuromuscular disease   ? GI/Hepatic ?GERD  Medicated and Controlled,(+)  ?  ? substance abuse ? ,   ?Endo/Other  ?Morbid obesity ? Renal/GU ?Renal disease  ? ?  ?Musculoskeletal ? ?(+) Arthritis ,  ? Abdominal ?(+) + obese,   ?Peds ? Hematology ? ?(+) Blood dyscrasia, anemia ,   ?Anesthesia Other Findings ?Possible Ectopic ? Reproductive/Obstetrics ? ?  ? ? ? ? ? ? ? ? ? ? ? ? ? ?  ?  ? ? ? ? ? ? ? ? ?Anesthesia Physical ?Anesthesia Plan ? ?ASA: 3 and emergent ? ?Anesthesia Plan: General  ? ?Post-op Pain Management:   ? ?Induction: Intravenous and Rapid sequence ? ?PONV Risk Score and Plan: 3 and Ondansetron, Dexamethasone, Midazolam and Treatment may vary due to age or medical condition ? ?Airway Management Planned: Oral ETT ? ?Additional Equipment:  ? ?Intra-op Plan:  ? ?Post-operative Plan: Extubation in OR ? ?Informed Consent: I have reviewed the patients History and Physical, chart, labs and discussed the procedure including the risks, benefits and alternatives for the proposed anesthesia with the patient or authorized representative who has indicated his/her understanding and acceptance.  ? ? ? ?Dental advisory given ? ?Plan Discussed with: CRNA ? ?Anesthesia Plan Comments:   ? ? ? ? ? ? ?Anesthesia Quick Evaluation ? ?

## 2021-06-27 NOTE — Progress Notes (Signed)
Called fiance Tobey Bride. No answer, left VM. ?

## 2021-06-27 NOTE — MAU Note (Signed)
Pt reports she had an ectopic pregnancy last month.  ? ?Pt reports she got a shot. She started bleeding on the 7th and has had pain since then.  ?

## 2021-06-27 NOTE — MAU Note (Signed)
Difficulty starting IV. Dr. Ilda Basset aware  ? ?3 RN attempts ?3 Provider attempts with u/s guidance  ?2 attempts per CRNA  ? ?IV team consult placed.  ?
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
## 2021-06-27 NOTE — Transfer of Care (Signed)
Immediate Anesthesia Transfer of Care Note ? ?Patient: Jeanette Gamble ? ?Procedure(s) Performed: Suction DILATATION AND EVACUATION with Ultrasound guidance ?LAPAROSCOPIC BILATERAL SALPINGECTOMY WITH REMOVAL OF ECTOPIC PREGNANCY (Bilateral) ? ?Patient Location: PACU ? ?Anesthesia Type:General ? ?Level of Consciousness: awake and alert  ? ?Airway & Oxygen Therapy: Patient Spontanous Breathing and Patient connected to face mask oxygen ? ?Post-op Assessment: Report given to RN and Post -op Vital signs reviewed and stable ? ?Post vital signs: Reviewed and stable ? ?Last Vitals:  ?Vitals Value Taken Time  ?BP 133/94 06/27/21 2151  ?Temp    ?Pulse 88 06/27/21 2151  ?Resp 22 06/27/21 2152  ?SpO2 96 % 06/27/21 2151  ?Vitals shown include unvalidated device data. ? ?Last Pain:  ?Vitals:  ? 06/27/21 1855  ?TempSrc: Oral  ?PainSc: 5   ?   ? ?  ? ?Complications: No notable events documented. ?

## 2021-06-27 NOTE — Anesthesia Procedure Notes (Signed)
Procedure Name: Intubation ?Date/Time: 06/27/2021 7:06 PM ?Performed by: Reece Agar, CRNA ?Pre-anesthesia Checklist: Patient identified, Emergency Drugs available, Suction available and Patient being monitored ?Patient Re-evaluated:Patient Re-evaluated prior to induction ?Oxygen Delivery Method: Circle System Utilized ?Preoxygenation: Pre-oxygenation with 100% oxygen ?Induction Type: IV induction and Cricoid Pressure applied ?Ventilation: Mask ventilation without difficulty ?Laryngoscope Size: Mac and 4 ?Grade View: Grade I ?Tube type: Oral ?Tube size: 7.0 mm ?Number of attempts: 1 ?Airway Equipment and Method: Stylet ?Placement Confirmation: ETT inserted through vocal cords under direct vision, positive ETCO2 and breath sounds checked- equal and bilateral ?Secured at: 22 cm ?Tube secured with: Tape ?Dental Injury: Teeth and Oropharynx as per pre-operative assessment  ? ? ? ? ?

## 2021-06-28 ENCOUNTER — Other Ambulatory Visit: Payer: Self-pay | Admitting: Obstetrics and Gynecology

## 2021-06-28 ENCOUNTER — Encounter (HOSPITAL_COMMUNITY): Payer: Self-pay | Admitting: Obstetrics and Gynecology

## 2021-06-28 DIAGNOSIS — O3680X0 Pregnancy with inconclusive fetal viability, not applicable or unspecified: Secondary | ICD-10-CM

## 2021-06-28 MED ORDER — OXYCODONE HCL 5 MG PO TABS: 5.0000 mg | ORAL_TABLET | ORAL | 0 refills | Status: DC | PRN

## 2021-06-28 MED ORDER — ONDANSETRON 4 MG PO TBDP: 4.0000 mg | ORAL_TABLET | Freq: Four times a day (QID) | ORAL | 0 refills | Status: DC | PRN

## 2021-06-28 NOTE — Anesthesia Postprocedure Evaluation (Signed)
Anesthesia Post Note ? ?Patient: Jeanette Gamble ? ?Procedure(s) Performed: Suction DILATATION AND EVACUATION with Ultrasound guidance ?LAPAROSCOPIC BILATERAL SALPINGECTOMY WITH REMOVAL OF ECTOPIC PREGNANCY (Bilateral) ? ?  ? ?Patient location during evaluation: PACU ?Anesthesia Type: General ?Level of consciousness: awake ?Pain management: pain level controlled ?Vital Signs Assessment: post-procedure vital signs reviewed and stable ?Respiratory status: spontaneous breathing, nonlabored ventilation, respiratory function stable and patient connected to nasal cannula oxygen ?Cardiovascular status: blood pressure returned to baseline and stable ?Postop Assessment: no apparent nausea or vomiting ?Anesthetic complications: no ? ? ?No notable events documented. ? ?Last Vitals:  ?Vitals:  ? 06/27/21 2300 06/27/21 2305  ?BP: (!) 143/92 (!) 148/92  ?Pulse: 74 84  ?Resp: 15 20  ?Temp: (!) 36.4 ?C   ?SpO2: 98% 98%  ?  ?Last Pain:  ?Vitals:  ? 06/27/21 2300  ?TempSrc:   ?PainSc: Asleep  ? ? ?  ?  ?  ?  ?  ?  ? ?Marney Treloar P Roderica Cathell ? ? ? ? ?

## 2021-06-28 NOTE — Progress Notes (Signed)
Pt went to [facility] and oxycodone on back order. Pharmacy cancelled all prescriptions per RN from PACU. Pt and partner recommended to have it sent to her regular pharmacy and they will pick it up in the morning since she has minimal pain after her surgery. She required no additional pain medication in PACU.  ?

## 2021-07-01 ENCOUNTER — Telehealth: Payer: Self-pay | Admitting: General Practice

## 2021-07-01 LAB — SURGICAL PATHOLOGY

## 2021-07-01 NOTE — Telephone Encounter (Signed)
Patient called into front office requesting a letter for work to be out since she had surgery recently.  ? ?Messaged with Dr [name] who states patient can return to work tomorrow.  ? ?Called patient, no answer- left message stating I am trying to reach her to return her call, please call us back ?

## 2021-07-01 NOTE — Telephone Encounter (Signed)
Patient called and left message on nurse voicemail line stating she is returning our phone call. ? ?Called patient and discussed return to work date of tomorrow per Dr [name]. Told patient letter would be viewable in mychart. Patient verbalized understanding. ?

## 2021-07-03 ENCOUNTER — Encounter: Payer: Medicaid Other | Admitting: Internal Medicine

## 2021-07-03 ENCOUNTER — Telehealth: Payer: Self-pay

## 2021-07-03 NOTE — Telephone Encounter (Signed)
Called patient to follow up per [name], MD. Provider recommends patient return for incision check and beta HCG lab this week. Pt agreeable to appt tomorrow morning at 0830. Rescheduled provider visit to 07/15/21 with Jeanette Congo MD due to patient's schedule.  ?

## 2021-07-03 NOTE — Telephone Encounter (Deleted)
-----   Message from Radene Gunning, MD sent at 06/27/2021 10:04 PM EST ----- ?Regarding: Follow up ?This patient needs a few items for follow up: ? ?She had L/S salpingectomy and Dilation and evacuation for pregnancy of unknown location following methotrexate and she was lost to follow up.  ? ?While I was at the surgery, she can follow up with ANY gynecologist. Most important is that she is seen and followed.  ? ?Her tubes were not impressive and she did not have POC from the D&E. The point is that she needs to continue to have beta HCGs until they go to zero. Ultrasound imaging is not useful.  ? ?Lastly, besides an incision check and beta next week, the follow up she needs specifically is to discuss hysterectomy so again, she can see any gynecologist.  ? ?It is really important we follow up and make sure we either call or send her a letter if she doesn't come for her appointments.  ? ?Thanks so much!  ? ?pad ? ?

## 2021-07-04 ENCOUNTER — Other Ambulatory Visit: Payer: Self-pay

## 2021-07-04 ENCOUNTER — Ambulatory Visit (INDEPENDENT_AMBULATORY_CARE_PROVIDER_SITE_OTHER): Payer: Medicaid Other | Admitting: Family Medicine

## 2021-07-04 ENCOUNTER — Ambulatory Visit (HOSPITAL_COMMUNITY): Admission: RE | Admit: 2021-07-04 | Payer: Medicaid Other | Source: Ambulatory Visit

## 2021-07-04 VITALS — BP 124/90 | Wt 243.9 lb

## 2021-07-04 DIAGNOSIS — O009 Unspecified ectopic pregnancy without intrauterine pregnancy: Secondary | ICD-10-CM | POA: Diagnosis not present

## 2021-07-04 DIAGNOSIS — R600 Localized edema: Secondary | ICD-10-CM

## 2021-07-04 DIAGNOSIS — Z5189 Encounter for other specified aftercare: Secondary | ICD-10-CM

## 2021-07-04 NOTE — Progress Notes (Addendum)
Here today for incision check and beta HCG lab draw. Lap sites are all clean and dry with dermabond present. Pt has not taken shower since surgery. Encouraged daily shower with warm, soapy water for good wound care.  ? ?Kennon Rounds MD to bedside to evaluate pain and edema to left ankle since surgery. Venous doppler scheduled per Kennon Rounds. Assisted patient with scheduling Medicaid transportation for future appts.  ? Apolonio Schneiders RN ?07/04/21 ?

## 2021-07-04 NOTE — Progress Notes (Signed)
? ?  Subjective:  ? ? Patient ID: Jeanette Gamble is a 41 y.o. female presenting with Wound Check ? on 07/04/2021 ? ?HPI: ?Patient is a [id number] who is s/p D & C, bilateral salpingectomy for undesired fertility and ectopic pregnancy. Since surgery she has had left LE swelling. She notes pain in the ankle. Right LE is normal. Pathology reviewed, appeared to be an IUP based on final dispo. Here today for repeat HCG. ? ?Review of Systems  ?Constitutional:  Negative for chills and fever.  ?Respiratory:  Negative for shortness of breath.   ?Cardiovascular:  Negative for chest pain.  ?Gastrointestinal:  Negative for abdominal pain, nausea and vomiting.  ?Genitourinary:  Negative for dysuria.  ?Musculoskeletal:  Positive for joint swelling.  ?Skin:  Negative for rash.  ?   ?Objective:  ?  ?BP 124/90   Wt 243 lb 14.4 oz (110.6 kg)   Breastfeeding Unknown   BMI 37.08 kg/m?  ?Physical Exam ?Exam conducted with a chaperone present.  ?Constitutional:   ?   General: She is not in acute distress. ?   Appearance: She is well-developed.  ?HENT:  ?   Head: Normocephalic and atraumatic.  ?Eyes:  ?   General: No scleral icterus. ?Cardiovascular:  ?   Rate and Rhythm: Normal rate.  ?Pulmonary:  ?   Effort: Pulmonary effort is normal.  ?Abdominal:  ?   Palpations: Abdomen is soft.  ?Musculoskeletal:     ?   General: Swelling present.  ?   Cervical back: Neck supple.  ?   Left lower leg: Edema present.  ?Skin: ?   General: Skin is warm and dry.  ?   Comments: All incisions look good  ?Neurological:  ?   Mental Status: She is alert and oriented to person, place, and time.  ? ? ? ?   ?Assessment & Plan:  ? ?Problem List Items Addressed This Visit   ?None ?Visit Diagnoses   ? ? Ectopic pregnancy, unspecified location, unspecified whether intrauterine pregnancy present    -  Primary  ? should not need further HCGs after today given pathology  ? Relevant Orders  ? Beta hCG quant (ref lab)  ? Visit for wound check      ? Leg edema, left       ? given no inujury or other explanantion, h/o superficial thrombophlebitis, will check dopplers.  ? Relevant Orders  ? VAS [place] LOWER EXTREMITY VENOUS (DVT)  ? ?  ? ? ? ?Return in about 2 weeks (around 07/18/2021) for postop check. ? ?Donnamae Jude, MD ?07/04/2021 ?1:25 PM ? ? ? ?

## 2021-07-05 LAB — BETA HCG QUANT (REF LAB): hCG Quant: 103 m[IU]/mL

## 2021-07-10 ENCOUNTER — Ambulatory Visit (INDEPENDENT_AMBULATORY_CARE_PROVIDER_SITE_OTHER): Payer: Medicaid Other | Admitting: Internal Medicine

## 2021-07-10 ENCOUNTER — Encounter: Payer: Medicaid Other | Admitting: Internal Medicine

## 2021-07-10 VITALS — BP 134/96 | HR 85 | Temp 98.4°F | Wt 242.3 lb

## 2021-07-10 DIAGNOSIS — N1831 Chronic kidney disease, stage 3a: Secondary | ICD-10-CM | POA: Diagnosis not present

## 2021-07-10 DIAGNOSIS — O009 Unspecified ectopic pregnancy without intrauterine pregnancy: Secondary | ICD-10-CM

## 2021-07-10 DIAGNOSIS — Z1159 Encounter for screening for other viral diseases: Secondary | ICD-10-CM

## 2021-07-10 DIAGNOSIS — Z0000 Encounter for general adult medical examination without abnormal findings: Secondary | ICD-10-CM

## 2021-07-10 DIAGNOSIS — D509 Iron deficiency anemia, unspecified: Secondary | ICD-10-CM

## 2021-07-10 DIAGNOSIS — I129 Hypertensive chronic kidney disease with stage 1 through stage 4 chronic kidney disease, or unspecified chronic kidney disease: Secondary | ICD-10-CM | POA: Diagnosis not present

## 2021-07-10 DIAGNOSIS — I10 Essential (primary) hypertension: Secondary | ICD-10-CM

## 2021-07-10 MED ORDER — ALLOPURINOL 100 MG PO TABS: 200.0000 mg | ORAL_TABLET | Freq: Every day | ORAL | 2 refills | Status: DC

## 2021-07-10 MED ORDER — LISINOPRIL 40 MG PO TABS: 40.0000 mg | ORAL_TABLET | Freq: Every day | ORAL | 2 refills | Status: DC

## 2021-07-10 MED ORDER — AMLODIPINE BESYLATE 10 MG PO TABS: 10.0000 mg | ORAL_TABLET | Freq: Every day | ORAL | 2 refills | Status: DC

## 2021-07-10 MED ORDER — HYDROCHLOROTHIAZIDE 25 MG PO TABS: 25.0000 mg | ORAL_TABLET | Freq: Every day | ORAL | 2 refills | Status: DC
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
## 2021-07-10 NOTE — Assessment & Plan Note (Signed)
Chronic and stable, with recent BMP showing renal function at baseline.  ? ?Continue to avoid nephrotoxins  ?BMP at f/u visit in 2 months  ?

## 2021-07-10 NOTE — Assessment & Plan Note (Signed)
Deferring healthcare maintenance at this time to follow up visit.  ?

## 2021-07-10 NOTE — Assessment & Plan Note (Addendum)
Ectopic pregnancy s/p D&E earlier this month. Reports feeling well but has fatigue which she attributes to "jumping straight into work" since having the procedure. She has continued to follow up with OB and has the green flag to discontinue serial HCG monitoring. Will obtain CBC today given that she has anemia related to blood loss during this admission. Additionally, will provide pt with work note to be able to work from home to improve with physical and mental fatigue. She is scheduled to follow up with OB again on 3/27.  ? ?Addendum: ?CBC with stable Hgb ?

## 2021-07-10 NOTE — Assessment & Plan Note (Signed)
BP 134/96 today. BP close to goal (< 130/80). Reports good medication compliance. To amlodipine, HCTZ, and Lisinopril. She denies taking Hydral. These were resumed after her D&E for ectopic pregnancy. Tolerating medication without adverse effects. Denies new headaches, vision changes, lightheadedness, chest pain, SHOB, or changes in speech. Exam benign and vitals otherwise wnl. Counseled on the importance of daily exercise, low salt diet, and weight loss. BMP 3 weeks ago with stable renal function. Given near goal pressure, and previous readings at other appointments with at goal pressures, will continue with the medications that she is currently taking, and continue to hold Hydral. Will follow up in 2 months, and obtain BMP at that time.  ? ?Continue with amlodipine, HCTZ, and Lisinopril. ?Continue to hold Hydralazine  ?Continue lifestyle changes ?Bmp at follow up visit ? ?

## 2021-07-10 NOTE — Patient Instructions (Signed)
Please continue take amlodipine, hydralazine, and hydrochlorothiazide (HCTZ).  ?Please go to your OB appointment on 3/27 ?Letter to work from home provided  ? ?

## 2021-07-11 ENCOUNTER — Encounter: Payer: Medicaid Other | Admitting: Obstetrics and Gynecology

## 2021-07-11 LAB — CBC
Hematocrit: 33.5 % — ABNORMAL LOW (ref 34.0–46.6)
Hemoglobin: 10.5 g/dL — ABNORMAL LOW (ref 11.1–15.9)
MCH: 26.5 pg — ABNORMAL LOW (ref 26.6–33.0)
MCHC: 31.3 g/dL — ABNORMAL LOW (ref 31.5–35.7)
MCV: 85 fL (ref 79–97)
Platelets: 338 10*3/uL (ref 150–450)
RBC: 3.96 x10E6/uL (ref 3.77–5.28)
RDW: 23.8 % — ABNORMAL HIGH (ref 11.7–15.4)
WBC: 8.5 10*3/uL (ref 3.4–10.8)

## 2021-07-12 NOTE — Progress Notes (Signed)
Internal Medicine Clinic Attending  Case discussed with Dr. Patel  At the time of the visit.  We reviewed the resident's history and exam and pertinent patient test results.  I agree with the assessment, diagnosis, and plan of care documented in the resident's note.  

## 2021-07-12 NOTE — Addendum Note (Signed)
Addended by: Charise Killian on: 07/12/2021 10:13 AM ? ? Modules accepted: Level of Service ? ?

## 2021-07-15 ENCOUNTER — Ambulatory Visit: Payer: Medicaid Other | Admitting: Obstetrics and Gynecology

## 2021-07-15 ENCOUNTER — Other Ambulatory Visit (HOSPITAL_COMMUNITY)
Admission: RE | Admit: 2021-07-15 | Discharge: 2021-07-15 | Disposition: A | Discharge status: Home / Self Care | Payer: Medicaid Other | Source: Ambulatory Visit | Attending: Obstetrics and Gynecology | Admitting: Obstetrics and Gynecology

## 2021-07-15 ENCOUNTER — Encounter: Payer: Self-pay | Admitting: Obstetrics and Gynecology

## 2021-07-15 ENCOUNTER — Other Ambulatory Visit: Payer: Self-pay

## 2021-07-15 VITALS — BP 141/104 | HR 75 | Wt 239.0 lb

## 2021-07-15 DIAGNOSIS — Z09 Encounter for follow-up examination after completed treatment for conditions other than malignant neoplasm: Secondary | ICD-10-CM

## 2021-07-15 DIAGNOSIS — Z01419 Encounter for gynecological examination (general) (routine) without abnormal findings: Secondary | ICD-10-CM

## 2021-07-15 DIAGNOSIS — F53 Postpartum depression: Secondary | ICD-10-CM

## 2021-07-15 DIAGNOSIS — O009 Unspecified ectopic pregnancy without intrauterine pregnancy: Secondary | ICD-10-CM | POA: Diagnosis not present

## 2021-07-15 MED ORDER — ACETAMINOPHEN 500 MG PO TABS
500.0000 mg | ORAL_TABLET | Freq: Once | ORAL | Status: AC
  Administered 2021-07-15: 500 mg via ORAL

## 2021-07-15 NOTE — Progress Notes (Signed)
? ? ?  Subjective:  ?  ?Jeanette Gamble is a 41 y.o. female who presents to the clinic status post laparoscopic bilateral salpingectomy and D and E on 06/27/21. Pain is controlled without any medications.  Eating a regular diet without difficulty. Bowel movements are normal. No other significant postoperative concerns. ? ?The following portions of the patient's history were reviewed and updated as appropriate: allergies, current medications, past family history, past medical history, past social history, past surgical history, and problem list..  Last pap smear was normal  on 06/27/16. ? ?Review of Systems ?Pertinent items are noted in HPI. ?  ?Objective:  ? ?BP (!) 141/104   Pulse 75   Wt 239 lb (108.4 kg)   Breastfeeding No   BMI 36.34 kg/m?  ?Constitutional:  Well-developed, well-nourished female in no acute distress.   ?Skin: Skin is warm and dry, no rash noted, not diaphoretic,no erythema, no pallor.  ?Cardiovascular: Normal heart rate noted  ?Respiratory: Effort and breath sounds normal, no problems with respiration noted  ?Abdomen: Soft, bowel sounds active, non-tender, no abnormal masses  ?Incision: Healing well, no drainage, no erythema, no hernia, no seroma, no swelling, no dehiscence, incision well approximated  ?Pelvic:    Normal external genitalia, normal vaginal mucosa and cervix, pap taken without incident  ? ?Surgical pathology D and C showed no villi or fetal elements ?Bilateral fallopian tubes showed no POC or ectopic ?Assessment:  ? ?Doing well postoperatively.  Operative findings again reviewed. Pathology report discussed. ?  ?Plan:  ? ?1. Continue any current medications. ?2. Wound care discussed. ?3. Activity restrictions:  light activity if possible ?4. Anticipated return to work: now. ?5. Follow up as needed, recheck serum quant bhcg today, pap results pending ?6.  Routine preventative health maintenance measures emphasized. ?7. 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  ? ? ?Lynnda Shields, MD, FACOG ?Attending [place] for [employer], [place]  ?

## 2021-07-16 ENCOUNTER — Encounter: Payer: Medicaid Other | Admitting: Obstetrics and Gynecology

## 2021-07-16 LAB — BETA HCG QUANT (REF LAB): hCG Quant: 4 m[IU]/mL

## 2021-07-18 ENCOUNTER — Telehealth: Payer: Self-pay | Admitting: *Deleted

## 2021-07-18 ENCOUNTER — Encounter: Payer: Self-pay | Admitting: *Deleted

## 2021-07-18 LAB — CYTOLOGY - PAP
Adequacy: ABSENT
Comment: NEGATIVE
Diagnosis: NEGATIVE
High risk HPV: NEGATIVE

## 2021-07-18 MED ORDER — METRONIDAZOLE 500 MG PO TABS
500.0000 mg | ORAL_TABLET | Freq: Two times a day (BID) | ORAL | 0 refills | Status: DC
Start: 2021-07-18 — End: 2021-11-18

## 2021-07-18 NOTE — BH Specialist Note (Deleted)
Integrated Behavioral Health via Telemedicine Visit ? ?07/18/2021 ?BHAKTI LABELLA ?[id number] ? ?Number of [facility] Clinician visits: No data recorded ?Session Start time: No data recorded  ?Session End time: No data recorded ?Total time in minutes: No data recorded ? ?Referring Provider: *** ?Patient/Family location: *** ?[facility] Provider location: *** ?All persons participating in visit: *** ?Types of Service: {CHL AMB TYPE OF SERVICE:6624362408} ? ?I connected with [name] and/or [place] {family members:20773} via  Telephone or Video Enabled Telemedicine Application  (Video is Caregility application) and verified that I am speaking with the correct person using two identifiers. Discussed confidentiality: {YES/NO:21197} ? ?I discussed the limitations of telemedicine and the availability of in person appointments.  Discussed there is a possibility of technology failure and discussed alternative modes of communication if that failure occurs. ? ?I discussed that engaging in this telemedicine visit, they consent to the provision of behavioral healthcare and the services will be billed under their insurance. ? ?Patient and/or legal guardian expressed understanding and consented to Telemedicine visit: {YES/NO:21197} ? ?Presenting Concerns: ?Patient and/or family reports the following symptoms/concerns: *** ?Duration of problem: ***; Severity of problem: {Mild/Moderate/Severe:20260} ? ?Patient and/or Family's Strengths/Protective Factors: ?{CHL AMB BH PROTECTIVE FACTORS:[phone]} ? ?Goals Addressed: ?Patient will: ? Reduce symptoms of: {IBH Symptoms:21014056}  ? Increase knowledge and/or ability of: {IBH Patient Tools:21014057}  ? Demonstrate ability to: {IBH Goals:21014053} ? ?Progress towards Goals: ?{CHL AMB BH PROGRESS TOWARDS FEOFH:2197588325} ? ?Interventions: ?Interventions utilized:  {IBH Interventions:21014054} ?Standardized Assessments completed: {IBH Screening  Tools:21014051} ? ?Patient and/or Family Response: *** ? ?Assessment: ?Patient currently experiencing ***.  ? ?Patient may benefit from ***. ? ?Plan: ?Follow up with behavioral health clinician on : *** ?Behavioral recommendations: *** ?Referral(s): {IBH Referrals:21014055} ? ?I discussed the assessment and treatment plan with the patient and/or parent/guardian. They were provided an opportunity to ask questions and all were answered. They agreed with the plan and demonstrated an understanding of the instructions. ?  ?They were advised to call back or seek an in-person evaluation if the symptoms worsen or if the condition fails to improve as anticipated. ? ?Garlan Fair, LCSW ?

## 2021-07-18 NOTE — Telephone Encounter (Addendum)
-----   Message from Griffin Basil, MD sent at 07/18/2021 12:57 PM EDT ----- ?Pap smear negative however, trichomonas noted, we will offer treatment ? ?3/30  1624  Called pt and she did not answer. Mychart message sent to pt regarding test results, treatment and prescription sent to pharmacy.  ?

## 2021-09-11 ENCOUNTER — Encounter: Payer: Medicaid Other | Admitting: Student

## 2021-09-12 ENCOUNTER — Encounter: Payer: Self-pay | Admitting: Internal Medicine

## 2021-09-20 ENCOUNTER — Telehealth: Payer: Self-pay | Admitting: Clinical

## 2021-09-20 NOTE — Telephone Encounter (Signed)
Attempt to call regarding referral; unable to leave voice message.

## 2021-10-04 ENCOUNTER — Encounter (HOSPITAL_COMMUNITY): Payer: Self-pay | Admitting: Emergency Medicine

## 2021-10-04 ENCOUNTER — Ambulatory Visit (HOSPITAL_COMMUNITY)
Admission: EM | Admit: 2021-10-04 | Discharge: 2021-10-04 | Disposition: A | Discharge status: Home / Self Care | Payer: Medicaid Other | Attending: Physician Assistant | Admitting: Physician Assistant

## 2021-10-04 DIAGNOSIS — I10 Essential (primary) hypertension: Secondary | ICD-10-CM | POA: Diagnosis not present

## 2021-10-04 DIAGNOSIS — R519 Headache, unspecified: Secondary | ICD-10-CM

## 2021-10-04 MED ORDER — KETOROLAC TROMETHAMINE 30 MG/ML IJ SOLN: 30.0000 mg | Freq: Once | INTRAMUSCULAR | Status: DC

## 2021-10-04 MED ORDER — KETOROLAC TROMETHAMINE 30 MG/ML IJ SOLN
INTRAMUSCULAR | Status: AC
  Filled 2021-10-04: qty 1

## 2021-10-04 MED ORDER — SUMATRIPTAN SUCCINATE 50 MG PO TABS: 50.0000 mg | ORAL_TABLET | ORAL | 0 refills | Status: DC | PRN

## 2021-10-04 NOTE — ED Triage Notes (Signed)
Pt had headache since Saturday. Reports hx migraines. Pt has nausea with vomiting x 2. Pt has sensitivity to light.  Pt c/o right flank pain that been intermittent x 2 days. Denies urinary or bowel problems.  Pt needing note for calling out of work yesterday.

## 2021-10-04 NOTE — Discharge Instructions (Signed)
See your Physician for recheck.  °
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
## 2021-10-12 ENCOUNTER — Emergency Department (HOSPITAL_COMMUNITY): Payer: Medicaid Other

## 2021-10-12 ENCOUNTER — Emergency Department (HOSPITAL_COMMUNITY)
Admission: EM | Admit: 2021-10-12 | Discharge: 2021-10-12 | Disposition: A | Discharge status: Home / Self Care | Payer: Medicaid Other | Attending: Emergency Medicine | Admitting: Emergency Medicine

## 2021-10-12 ENCOUNTER — Encounter (HOSPITAL_COMMUNITY): Payer: Self-pay

## 2021-10-12 DIAGNOSIS — N189 Chronic kidney disease, unspecified: Secondary | ICD-10-CM | POA: Insufficient documentation

## 2021-10-12 DIAGNOSIS — R7989 Other specified abnormal findings of blood chemistry: Secondary | ICD-10-CM | POA: Diagnosis not present

## 2021-10-12 DIAGNOSIS — R1084 Generalized abdominal pain: Secondary | ICD-10-CM | POA: Diagnosis not present

## 2021-10-12 DIAGNOSIS — R109 Unspecified abdominal pain: Secondary | ICD-10-CM | POA: Diagnosis not present

## 2021-10-12 LAB — COMPREHENSIVE METABOLIC PANEL
ALT: 17 U/L (ref 0–44)
AST: 20 U/L (ref 15–41)
Albumin: 3.5 g/dL (ref 3.5–5.0)
Alkaline Phosphatase: 52 U/L (ref 38–126)
Anion gap: 8 (ref 5–15)
BUN: 24 mg/dL — ABNORMAL HIGH (ref 6–20)
CO2: 24 mmol/L (ref 22–32)
Calcium: 9.1 mg/dL (ref 8.9–10.3)
Chloride: 109 mmol/L (ref 98–111)
Creatinine, Ser: 1.38 mg/dL — ABNORMAL HIGH (ref 0.44–1.00)
GFR, Estimated: 49 mL/min — ABNORMAL LOW (ref >60)
Glucose, Bld: 88 mg/dL (ref 70–99)
Potassium: 3.7 mmol/L (ref 3.5–5.1)
Sodium: 141 mmol/L (ref 135–145)
Total Bilirubin: 0.7 mg/dL (ref 0.3–1.2)
Total Protein: 6.9 g/dL (ref 6.5–8.1)

## 2021-10-12 LAB — CBC
HCT: 32.2 % — ABNORMAL LOW (ref 36.0–46.0)
Hemoglobin: 10.4 g/dL — ABNORMAL LOW (ref 12.0–15.0)
MCH: 28.6 pg (ref 26.0–34.0)
MCHC: 32.3 g/dL (ref 30.0–36.0)
MCV: 88.5 fL (ref 80.0–100.0)
Platelets: 247 10*3/uL (ref 150–400)
RBC: 3.64 MIL/uL — ABNORMAL LOW (ref 3.87–5.11)
RDW: 14.4 % (ref 11.5–15.5)
WBC: 7.3 10*3/uL (ref 4.0–10.5)
nRBC: 0 % (ref 0.0–0.2)

## 2021-10-12 LAB — URINALYSIS, ROUTINE W REFLEX MICROSCOPIC
Bilirubin Urine: NEGATIVE
Glucose, UA: NEGATIVE mg/dL
Hgb urine dipstick: NEGATIVE
Ketones, ur: NEGATIVE mg/dL
Leukocytes,Ua: NEGATIVE
Nitrite: NEGATIVE
Protein, ur: NEGATIVE mg/dL
Specific Gravity, Urine: 1.017 (ref 1.005–1.030)
pH: 5 (ref 5.0–8.0)

## 2021-10-12 LAB — I-STAT BETA HCG BLOOD, ED (MC, WL, AP ONLY): I-stat hCG, quantitative: <5 m[IU]/mL (ref <5)

## 2021-10-12 LAB — LIPASE, BLOOD: Lipase: 27 U/L (ref 11–51)

## 2021-10-12 MED ORDER — HYDROMORPHONE HCL 1 MG/ML IJ SOLN
0.5000 mg | Freq: Once | INTRAMUSCULAR | Status: AC
  Administered 2021-10-12: 0.5 mg via INTRAVENOUS
  Filled 2021-10-12: qty 1

## 2021-10-12 MED ORDER — SODIUM CHLORIDE 0.9 % IV BOLUS
1000.0000 mL | Freq: Once | INTRAVENOUS | Status: AC
  Administered 2021-10-12: 1000 mL via INTRAVENOUS

## 2021-10-12 MED ORDER — ONDANSETRON HCL 4 MG/2ML IJ SOLN
4.0000 mg | Freq: Once | INTRAMUSCULAR | Status: AC
  Administered 2021-10-12: 4 mg via INTRAVENOUS
  Filled 2021-10-12: qty 2

## 2021-10-12 MED ORDER — IOHEXOL 300 MG/ML  SOLN
100.0000 mL | Freq: Once | INTRAMUSCULAR | Status: AC | PRN
  Administered 2021-10-12: 100 mL via INTRAVENOUS

## 2021-10-16 ENCOUNTER — Other Ambulatory Visit: Payer: Self-pay | Admitting: Obstetrics and Gynecology

## 2021-10-16 DIAGNOSIS — O3680X0 Pregnancy with inconclusive fetal viability, not applicable or unspecified: Secondary | ICD-10-CM

## 2021-11-17 ENCOUNTER — Other Ambulatory Visit: Payer: Self-pay | Admitting: Student

## 2021-11-17 ENCOUNTER — Other Ambulatory Visit: Payer: Self-pay

## 2021-11-17 ENCOUNTER — Ambulatory Visit (HOSPITAL_COMMUNITY)
Admission: EM | Admit: 2021-11-17 | Discharge: 2021-11-17 | Disposition: A | Discharge status: Home / Self Care | Payer: Medicaid Other

## 2021-11-17 ENCOUNTER — Encounter (HOSPITAL_COMMUNITY): Payer: Self-pay | Admitting: Emergency Medicine

## 2021-11-17 ENCOUNTER — Emergency Department (HOSPITAL_COMMUNITY)
Admission: EM | Admit: 2021-11-17 | Discharge: 2021-11-18 | Discharge status: Against Medical Advice | Payer: Medicaid Other | Attending: Emergency Medicine | Admitting: Emergency Medicine

## 2021-11-17 DIAGNOSIS — R110 Nausea: Secondary | ICD-10-CM

## 2021-11-17 DIAGNOSIS — R519 Headache, unspecified: Secondary | ICD-10-CM

## 2021-11-17 DIAGNOSIS — R5383 Other fatigue: Secondary | ICD-10-CM | POA: Diagnosis not present

## 2021-11-17 DIAGNOSIS — R1084 Generalized abdominal pain: Secondary | ICD-10-CM | POA: Insufficient documentation

## 2021-11-17 DIAGNOSIS — R42 Dizziness and giddiness: Secondary | ICD-10-CM | POA: Insufficient documentation

## 2021-11-17 DIAGNOSIS — G8929 Other chronic pain: Secondary | ICD-10-CM | POA: Diagnosis not present

## 2021-11-17 DIAGNOSIS — R531 Weakness: Secondary | ICD-10-CM | POA: Diagnosis not present

## 2021-11-17 DIAGNOSIS — Z5321 Procedure and treatment not carried out due to patient leaving prior to being seen by health care provider: Secondary | ICD-10-CM | POA: Diagnosis not present

## 2021-11-17 LAB — COMPREHENSIVE METABOLIC PANEL
ALT: 15 U/L (ref 0–44)
AST: 16 U/L (ref 15–41)
Albumin: 3.6 g/dL (ref 3.5–5.0)
Alkaline Phosphatase: 56 U/L (ref 38–126)
Anion gap: 9 (ref 5–15)
BUN: 13 mg/dL (ref 6–20)
CO2: 20 mmol/L — ABNORMAL LOW (ref 22–32)
Calcium: 8.9 mg/dL (ref 8.9–10.3)
Chloride: 109 mmol/L (ref 98–111)
Creatinine, Ser: 1.23 mg/dL — ABNORMAL HIGH (ref 0.44–1.00)
GFR, Estimated: 57 mL/min — ABNORMAL LOW (ref >60)
Glucose, Bld: 75 mg/dL (ref 70–99)
Potassium: 3.6 mmol/L (ref 3.5–5.1)
Sodium: 138 mmol/L (ref 135–145)
Total Bilirubin: 0.4 mg/dL (ref 0.3–1.2)
Total Protein: 7 g/dL (ref 6.5–8.1)

## 2021-11-17 LAB — CBC
HCT: 32.8 % — ABNORMAL LOW (ref 36.0–46.0)
Hemoglobin: 10.5 g/dL — ABNORMAL LOW (ref 12.0–15.0)
MCH: 27.5 pg (ref 26.0–34.0)
MCHC: 32 g/dL (ref 30.0–36.0)
MCV: 85.9 fL (ref 80.0–100.0)
Platelets: 221 10*3/uL (ref 150–400)
RBC: 3.82 MIL/uL — ABNORMAL LOW (ref 3.87–5.11)
RDW: 14.6 % (ref 11.5–15.5)
WBC: 6.4 10*3/uL (ref 4.0–10.5)
nRBC: 0 % (ref 0.0–0.2)

## 2021-11-17 LAB — POC URINE PREG, ED: Preg Test, Ur: NEGATIVE

## 2021-11-17 LAB — LIPASE, BLOOD: Lipase: 24 U/L (ref 11–51)

## 2021-11-17 NOTE — ED Triage Notes (Signed)
Pt c/o generalized abd pain but worse in RLQ, nausea, HA, dizziness x "last several months (since Feb)" after tubal ligation. Nausea but no vomiting, all worsening over last couple days. Generalized fatigue, weakness.

## 2021-11-17 NOTE — Discharge Instructions (Addendum)
Please go to the ER as soon as you leave urgent care for further evaluation and management.  
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
## 2021-11-17 NOTE — ED Triage Notes (Signed)
Nausea, vomiting and abdominal pain.  Headaches continue since being seen 10/04/2021.  Patient has said several times she is out of pain medicine.  Patient has had dizziness, bloated.  Last bm was this morning, described as "runny".  Feeling weak and tired.  Multiple sites of pain in abdomen depending on movement of torso.    Has not resceduled appt with pcp

## 2021-11-18 ENCOUNTER — Other Ambulatory Visit: Payer: Self-pay

## 2021-11-18 ENCOUNTER — Ambulatory Visit (INDEPENDENT_AMBULATORY_CARE_PROVIDER_SITE_OTHER): Payer: Medicaid Other | Admitting: Student

## 2021-11-18 ENCOUNTER — Encounter: Payer: Self-pay | Admitting: Student

## 2021-11-18 ENCOUNTER — Ambulatory Visit (HOSPITAL_COMMUNITY)
Admission: RE | Admit: 2021-11-18 | Discharge: 2021-11-18 | Disposition: A | Discharge status: Home / Self Care | Payer: Medicaid Other | Source: Ambulatory Visit | Attending: Internal Medicine | Admitting: Internal Medicine

## 2021-11-18 VITALS — BP 152/83 | HR 58 | Temp 98.7°F | Ht 68.0 in | Wt 248.1 lb

## 2021-11-18 DIAGNOSIS — R110 Nausea: Secondary | ICD-10-CM | POA: Insufficient documentation

## 2021-11-18 DIAGNOSIS — R1084 Generalized abdominal pain: Secondary | ICD-10-CM

## 2021-11-18 DIAGNOSIS — D259 Leiomyoma of uterus, unspecified: Secondary | ICD-10-CM | POA: Diagnosis not present

## 2021-11-18 LAB — URINALYSIS, ROUTINE W REFLEX MICROSCOPIC
Bilirubin Urine: NEGATIVE
Glucose, UA: NEGATIVE mg/dL
Ketones, ur: NEGATIVE mg/dL
Nitrite: NEGATIVE
Protein, ur: NEGATIVE mg/dL
Specific Gravity, Urine: 1.017 (ref 1.005–1.030)
pH: 5 (ref 5.0–8.0)

## 2021-11-18 LAB — CBC
HCT: 33.8 % — ABNORMAL LOW (ref 36.0–46.0)
Hemoglobin: 10.7 g/dL — ABNORMAL LOW (ref 12.0–15.0)
MCH: 27.6 pg (ref 26.0–34.0)
MCHC: 31.7 g/dL (ref 30.0–36.0)
MCV: 87.1 fL (ref 80.0–100.0)
Platelets: 219 10*3/uL (ref 150–400)
RBC: 3.88 MIL/uL (ref 3.87–5.11)
RDW: 14.4 % (ref 11.5–15.5)
WBC: 7 10*3/uL (ref 4.0–10.5)
nRBC: 0 % (ref 0.0–0.2)

## 2021-11-18 LAB — COMPREHENSIVE METABOLIC PANEL
ALT: 14 U/L (ref 0–44)
AST: 15 U/L (ref 15–41)
Albumin: 3.6 g/dL (ref 3.5–5.0)
Alkaline Phosphatase: 54 U/L (ref 38–126)
Anion gap: 8 (ref 5–15)
BUN: 13 mg/dL (ref 6–20)
CO2: 22 mmol/L (ref 22–32)
Calcium: 8.8 mg/dL — ABNORMAL LOW (ref 8.9–10.3)
Chloride: 109 mmol/L (ref 98–111)
Creatinine, Ser: 1.32 mg/dL — ABNORMAL HIGH (ref 0.44–1.00)
GFR, Estimated: 52 mL/min — ABNORMAL LOW (ref >60)
Glucose, Bld: 85 mg/dL (ref 70–99)
Potassium: 3.9 mmol/L (ref 3.5–5.1)
Sodium: 139 mmol/L (ref 135–145)
Total Bilirubin: 0.6 mg/dL (ref 0.3–1.2)
Total Protein: 7.1 g/dL (ref 6.5–8.1)

## 2021-11-18 LAB — LIPASE, BLOOD: Lipase: 26 U/L (ref 11–51)

## 2021-11-18 MED ORDER — ONDANSETRON HCL 4 MG PO TABS: 4.0000 mg | ORAL_TABLET | Freq: Three times a day (TID) | ORAL | 0 refills | Status: DC | PRN

## 2021-11-18 NOTE — Patient Instructions (Signed)
  Thank you, Ms.[name] for allowing [place] to provide your care today. Today we discussed . . .  - abdominal pain  - we have ordered a CT scan and ordered blood work to determine the underlying cause - associated nausea  - we have prescribed a short course of ondansetron to help your symptoms   I have ordered the following labs for you:   Lab Orders         Lipase         CMP14 + Anion Gap         CBC no Diff       Tests ordered today:  CT of the abdomen and pelvis without contrast   Referrals ordered today:   Referral Orders  No referral(s) requested today      I have ordered the following medication/changed the following medications:   Stop the following medications: Medications Discontinued During This Encounter  Medication Reason   ondansetron (ZOFRAN-ODT) 4 MG disintegrating tablet Duplicate   ALLOPURINOL PO Duplicate   metroNIDAZOLE (FLAGYL) 500 MG tablet Completed Course     Start the following medications: Meds ordered this encounter  Medications   ondansetron (ZOFRAN) 4 MG tablet    Sig: Take 1 tablet (4 mg total) by mouth every 8 (eight) hours as needed for up to 12 doses for nausea or vomiting.    Dispense:  12 tablet    Refill:  0      Follow up: 6 months    Remember: Take the ondansetron for nausea as needed up to every eight hours   Should you have any questions or concerns please call the internal medicine clinic at [phone].     Roswell Nickel, MD [place]

## 2021-11-18 NOTE — ED Notes (Signed)
Patient states the wait is too long and she is leaving 
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
## 2021-11-19 ENCOUNTER — Telehealth: Payer: Self-pay | Admitting: *Deleted

## 2021-11-19 ENCOUNTER — Encounter: Payer: Self-pay | Admitting: Student

## 2021-11-19 ENCOUNTER — Other Ambulatory Visit: Payer: Self-pay | Admitting: Student

## 2021-11-19 MED ORDER — SUMATRIPTAN SUCCINATE 50 MG PO TABS: 50.0000 mg | ORAL_TABLET | ORAL | 2 refills | Status: DC | PRN

## 2021-11-19 MED ORDER — ACETAMINOPHEN-CODEINE 300-30 MG PO TABS: 1.0000 | ORAL_TABLET | Freq: Four times a day (QID) | ORAL | 0 refills | Status: DC | PRN

## 2021-11-19 MED ORDER — COLCHICINE 0.6 MG PO CAPS: 0.6000 mg | ORAL_CAPSULE | Freq: Every day | ORAL | 2 refills | Status: DC

## 2021-11-19 NOTE — Telephone Encounter (Signed)
Discussed RUQ pain with Dr Jodi Mourning, we reviewed CT findings, pain is likely coming from enlarged right adnexal cyst which has grown from previous CT.  Will need close follow up for GYN and pelvic ultrasound, will provide short course of Tylenol #3.

## 2021-11-19 NOTE — Assessment & Plan Note (Signed)
Pain started after bilateral salpingectomy performed in February 2023. It felt okay with the help of pain medications including oxycodone, but then returned stronger than before around April. She describes the pain as a constant, sharp, stabbing, shooting pain that involves her entire abdomen. It is more pronounced on the right side sometimes radiating to the flank, epigastric, and suprapubic regions. She describes the pain as deep rather than superficial. Eating or drinking makes the pain worse, fatty foods are slightly worse than other types of food. Pain makes breathing difficult. Tylenol does not help and cannot take ibuprofen due to CKD IIIa.  - Order stat CT of the abdomen and pelvis to evaluate for acute pathology - Collect lipase, CBC, and CMP to rule out biliary or pancreatic process

## 2021-11-19 NOTE — Telephone Encounter (Signed)
Patient calling for CT results and med refills.

## 2021-11-19 NOTE — Assessment & Plan Note (Signed)
Patient experiencing nausea concurrently with her abdominal pain. She denies vomiting or any abnormally colored stools.   > Prescribe 12-dose course of ondansetron for nausea as needed

## 2021-11-22 ENCOUNTER — Telehealth: Payer: Self-pay | Admitting: Student

## 2021-11-22 NOTE — Telephone Encounter (Signed)
Patient is calling requesting an extension on her work note.  Forwarding to triage nurse.

## 2021-11-22 NOTE — Telephone Encounter (Signed)
RTC to patient no answer.  Message that Clinics had returned her call.

## 2021-11-26 ENCOUNTER — Encounter: Payer: Self-pay | Admitting: Student

## 2021-11-26 ENCOUNTER — Other Ambulatory Visit: Payer: Self-pay | Admitting: *Deleted

## 2021-11-26 NOTE — Telephone Encounter (Addendum)
Call from patient states that she was able to contact her GYN who said that she could not bee seen in sooner than the 01/02/2022 appointment scheduled.  Patient states pain has worsened since she started her period a few days ago.  Having clots with the period as well.  Has gotten a note t return to work today.  Was unable to go in today .  Would like to get a not to return to work on 11/28/2021.  Is off on 11/27/2021.  Just needs coverage for today. Stated that the Tylenol # 3 really helps with the abdominal pain and needs a refill until her visit with the GYN.

## 2021-11-27 ENCOUNTER — Other Ambulatory Visit: Payer: Self-pay | Admitting: Obstetrics and Gynecology

## 2021-11-27 NOTE — Patient Outreach (Signed)
Care Coordination  11/27/2021  Jeanette Gamble 1980-11-01 [id number]   Medicaid Managed Care   Unsuccessful Outreach Note  11/27/2021 Name: Jeanette Gamble MRN: [id number] DOB: Mar 23, 1981  Referred by: Serita Butcher, MD Reason for referral : High Risk Managed Medicaid (Unsuccessful telephone outreach)   An unsuccessful telephone outreach was attempted today. The patient was referred to the case management team for assistance with care management and care coordination.   Follow Up Plan: The care management team will reach out to the patient again over the next 30 business  days.   Aida Raider RN, BSN [place]  [facility] - Managed Medicaid High Risk [phone].

## 2021-11-27 NOTE — Patient Instructions (Signed)
Visit Information  [name]  - as a part of your Medicaid benefit, you are eligible for care management and care coordination services at no cost or copay. I was unable to reach you by phone today but would be happy to help you with your health related needs. Please feel free to call me at [phone].  A member of the Managed Medicaid care management team will reach out to you again over the next 30 business  days.   Aida Raider RN, BSN [place]  [facility] - Managed Medicaid High Risk [phone].

## 2021-12-08 NOTE — Progress Notes (Signed)
Internal Medicine Clinic Attending  I saw and evaluated the patient.  I personally confirmed the key portions of the history and exam documented by Dr. Jodi Mourning and I reviewed pertinent patient test results.  The assessment, diagnosis, and plan were formulated together and I agree with the documentation in the resident's note. Her symptoms are significant and warrant further evaluation with CT given recent surgery and known fibroid uterus.  Prompt f/u with GYN would be appropriate.

## 2022-01-02 ENCOUNTER — Other Ambulatory Visit: Payer: Self-pay

## 2022-01-02 ENCOUNTER — Encounter: Payer: Self-pay | Admitting: Family Medicine

## 2022-01-02 ENCOUNTER — Ambulatory Visit (INDEPENDENT_AMBULATORY_CARE_PROVIDER_SITE_OTHER): Payer: Medicaid Other | Admitting: Family Medicine

## 2022-01-02 VITALS — BP 157/124 | HR 76 | Wt 249.0 lb

## 2022-01-02 DIAGNOSIS — N83201 Unspecified ovarian cyst, right side: Secondary | ICD-10-CM

## 2022-01-02 DIAGNOSIS — N920 Excessive and frequent menstruation with regular cycle: Secondary | ICD-10-CM | POA: Diagnosis not present

## 2022-01-02 DIAGNOSIS — D259 Leiomyoma of uterus, unspecified: Secondary | ICD-10-CM | POA: Diagnosis not present

## 2022-01-02 DIAGNOSIS — N946 Dysmenorrhea, unspecified: Secondary | ICD-10-CM

## 2022-01-02 DIAGNOSIS — D649 Anemia, unspecified: Secondary | ICD-10-CM | POA: Diagnosis not present

## 2022-01-02 DIAGNOSIS — I10 Essential (primary) hypertension: Secondary | ICD-10-CM | POA: Diagnosis not present

## 2022-01-02 DIAGNOSIS — F17200 Nicotine dependence, unspecified, uncomplicated: Secondary | ICD-10-CM

## 2022-01-02 MED ORDER — NORETHINDRONE ACETATE 5 MG PO TABS: 5.0000 mg | ORAL_TABLET | Freq: Every day | ORAL | 1 refills | Status: DC

## 2022-01-02 NOTE — Patient Instructions (Signed)
Congratulations on wanting to quit smoking! I am so happy to hear this! Here are some suggested steps to help make this process easier: 1. Set a quit date - usually 2-4 wks in the future - gives you time to mentally prepare 2. Enlist the help of someone - it is easier with a cheerleader 3. Think about your triggers - do you like to have one after meals, first thing in the morning, etc. 4. Write down a list of alternatives to lighting up - hopefully not involving consuming more calories     A. Making a quick phone call - bonus, reconnecting with someone you care about     B. Chewing a piece of sugar-free gum     C. Drinking a large glass of water - bonus, you need to sty hydrated while pregnant     D. Taking a 10 minute walk - bonus, getting healthy, burning calories, improves mood 5. Put the list where you can see it and quickly use it when the cravings hit 6. Write a list of why you should quit     A. Your breath will smell better     B. Your lungs will thank you - they start to improve very quickly     C. Your pocketbook will thank you - actually write down what you spend on cigarettes/wk or month and start saving that money--at a bank or at home in a jar. Do the quick math, and you can probably save enough money in 1 year for a cool vacation or something special you have your eye on.     D. Insert your reasons here 7. Post this list where you can see it if your motivation wanes 8. Consider reducing how many cigarettes you smoke daily. Reduce the number by 1 everyday until the quit date arrives.     Example: You smoke 10/day, then tomorrow, you are only allowed 9/day for a couple of days, then 8/day and so on. By the time you get to your quit date, hopefully, you are down to < 5/day. Once you are down to this number, you are no longer physically addicted, you just have a habit. It is a hard habit to break, but I know you can do this!!! I hope this helps. Let me know if you have other  questions.

## 2022-01-02 NOTE — Assessment & Plan Note (Addendum)
Likely related to fibroids--trial of Aygestin.

## 2022-01-02 NOTE — Assessment & Plan Note (Signed)
Seen on CT--will get u/s to further clarify.

## 2022-01-02 NOTE — Progress Notes (Signed)
Subjective:    Patient ID: Jeanette Gamble is a 41 y.o. female presenting with Abdominal Pain  on 01/02/2022  HPI: Has fibroids, about 22 wk size. Has ho C-section x 1 in 2003 due to severe PreE. and heavy bleeding notes cycles are monthly lasts 5-6 days Having intermenstrual bleeding. Has pain and clotting related to her menstrual cycles and significant dysmenorrhea. She also has a right ovarian cyst that needs further characterization.  Review of Systems  Constitutional:  Negative for chills and fever.  Respiratory:  Negative for shortness of breath.   Cardiovascular:  Negative for chest pain.  Gastrointestinal:  Negative for abdominal pain, nausea and vomiting.  Genitourinary:  Negative for dysuria.  Skin:  Negative for rash.      Objective:    BP (!) 157/124   Pulse 76   Wt 249 lb (112.9 kg)   LMP 11/25/2021 (Approximate)   BMI 37.86 kg/m  Physical Exam Exam conducted with a chaperone present.  Constitutional:      General: She is not in acute distress.    Appearance: She is well-developed.  HENT:     Head: Normocephalic and atraumatic.  Eyes:     General: No scleral icterus. Cardiovascular:     Rate and Rhythm: Normal rate.  Pulmonary:     Effort: Pulmonary effort is normal.  Abdominal:     Palpations: Abdomen is soft. There is mass (22 week size, firm).  Musculoskeletal:     Cervical back: Neck supple.  Skin:    General: Skin is warm and dry.  Neurological:     Mental Status: She is alert and oriented to person, place, and time.         Assessment & Plan:   Problem List Items Addressed This Visit       Unprioritized   Essential hypertension - Primary (Chronic)    Adjust diet - avoid salt. Continue 3 meds F/u with PCP for adjustment, surgery will not go forward with this BP.      MENORRHAGIA    Likely related to fibroids--trial of Aygestin.      Relevant Medications   norethindrone (AYGESTIN) 5 MG tablet   Other Relevant Orders   US  PELVIC COMPLETE WITH TRANSVAGINAL   Tobacco use disorder    Advised Cessation. Surgery is likely to be better. Congratulations on wanting to quit smoking! I am so happy to hear this! Here are some suggested steps to help make this process easier: 1. Set a quit date - usually 2-4 wks in the future - gives you time to mentally prepare 2. Enlist the help of someone - it is easier with a cheerleader 3. Think about your triggers - do you like to have one after meals, first thing in the morning, etc. 4. Write down a list of alternatives to lighting up - hopefully not involving consuming more calories     A. Making a quick phone call - bonus, reconnecting with someone you care about     B. Chewing a piece of sugar-free gum     C. Drinking a large glass of water - bonus, you need to sty hydrated while pregnant     D. Taking a 10 minute walk - bonus, getting healthy, burning calories, improves mood 5. Put the list where you can see it and quickly use it when the cravings hit 6. Write a list of why you should quit     A. Your breath will smell better  B. Your lungs will thank you - they start to improve very quickly     C. Your pocketbook will thank you - actually write down what you spend on cigarettes/wk or month and start saving that money--at a bank or at home in a jar. Do the quick math, and you can probably save enough money in 1 year for a cool vacation or something special you have your eye on.     D. Insert your reasons here 7. Post this list where you can see it if your motivation wanes 8. Consider reducing how many cigarettes you smoke daily. Reduce the number by 1 everyday until the quit date arrives.     Example: You smoke 10/day, then tomorrow, you are only allowed 9/day for a couple of days, then 8/day and so on. By the time you get to your quit date, hopefully, you are down to < 5/day. Once you are down to this number, you are no longer physically addicted, you just have a habit. It is a  hard habit to break, but I know you can do this!!! I hope this helps. Let me know if you have other questions.       Dysmenorrhea    Likely related to fibroids      Right ovarian cyst    Seen on CT--will get u/s to further clarify.      Uterine fibroid    Very large and cumbersome. H/o C-section x 1 and could consider DepoLupron to shrink. Will check u/s for size.      Relevant Orders   US PELVIC COMPLETE WITH TRANSVAGINAL   Symptomatic anemia    Continue iron QOD        Return in about 2 months (around 03/04/2022) for book with Dr. Currie Paris for possile hysterectomy.  Donnamae Jude, MD 01/02/2022 1:59 PM

## 2022-01-02 NOTE — Assessment & Plan Note (Signed)
Very large and cumbersome. H/o C-section x 1 and could consider DepoLupron to shrink. Will check u/s for size.

## 2022-01-02 NOTE — Assessment & Plan Note (Signed)
Likely related to fibroids

## 2022-01-02 NOTE — Assessment & Plan Note (Signed)
Advised Cessation. Surgery is likely to be better. Congratulations on wanting to quit smoking! I am so happy to hear this! Here are some suggested steps to help make this process easier: 1. Set a quit date - usually 2-4 wks in the future - gives you time to mentally prepare 2. Enlist the help of someone - it is easier with a cheerleader 3. Think about your triggers - do you like to have one after meals, first thing in the morning, etc. 4. Write down a list of alternatives to lighting up - hopefully not involving consuming more calories     A. Making a quick phone call - bonus, reconnecting with someone you care about     B. Chewing a piece of sugar-free gum     C. Drinking a large glass of water - bonus, you need to sty hydrated while pregnant     D. Taking a 10 minute walk - bonus, getting healthy, burning calories, improves mood 5. Put the list where you can see it and quickly use it when the cravings hit 6. Write a list of why you should quit     A. Your breath will smell better     B. Your lungs will thank you - they start to improve very quickly     C. Your pocketbook will thank you - actually write down what you spend on cigarettes/wk or month and start saving that money--at a bank or at home in a jar. Do the quick math, and you can probably save enough money in 1 year for a cool vacation or something special you have your eye on.     D. Insert your reasons here 7. Post this list where you can see it if your motivation wanes 8. Consider reducing how many cigarettes you smoke daily. Reduce the number by 1 everyday until the quit date arrives.     Example: You smoke 10/day, then tomorrow, you are only allowed 9/day for a couple of days, then 8/day and so on. By the time you get to your quit date, hopefully, you are down to < 5/day. Once you are down to this number, you are no longer physically addicted, you just have a habit. It is a hard habit to break, but I know you can do this!!! I hope  this helps. Let me know if you have other questions.

## 2022-01-02 NOTE — Assessment & Plan Note (Signed)
Continue iron QOD

## 2022-01-02 NOTE — Assessment & Plan Note (Signed)
Adjust diet - avoid salt. Continue 3 meds F/u with PCP for adjustment, surgery will not go forward with this BP.

## 2022-01-06 ENCOUNTER — Other Ambulatory Visit: Payer: Self-pay | Admitting: Obstetrics and Gynecology

## 2022-01-06 NOTE — Patient Instructions (Signed)
Hi Ms. Harada, I am sorry I missed you today - as a part of your Medicaid benefit, you are eligible for care management and care coordination services at no cost or copay. I was unable to reach you by phone today but would be happy to help you with your health related needs. Please feel free to call me at [phone].  A member of the Managed Medicaid care management team will reach out to you again over the next 30 business  days.   Aida Raider RN, BSN [place]  [facility] - Managed Medicaid High Risk [phone].

## 2022-01-06 NOTE — Patient Outreach (Signed)
Care Coordination  01/06/2022  SPRING SAN [birth year] [id number]   Medicaid Managed Care   Unsuccessful Outreach Note  01/06/2022 Name: Jeanette Gamble MRN: [id number] DOB: 28-Feb-1981  Referred by: Serita Butcher, MD Reason for referral : High Risk Managed Medicaid (Unsuccessful telephone outreach)   A second unsuccessful telephone outreach was attempted today. The patient was referred to the case management team for assistance with care management and care coordination.   Follow Up Plan: The care management team will reach out to the patient again over the next 30 business  days.   Aida Raider RN, BSN [place]  [facility] - Managed Medicaid High Risk [phone].

## 2022-01-19 ENCOUNTER — Other Ambulatory Visit: Payer: Self-pay | Admitting: Student

## 2022-01-19 DIAGNOSIS — R110 Nausea: Secondary | ICD-10-CM

## 2022-01-20 MED ORDER — ONDANSETRON HCL 4 MG PO TABS: 4.0000 mg | ORAL_TABLET | Freq: Three times a day (TID) | ORAL | 0 refills | Status: DC | PRN

## 2022-01-20 MED ORDER — OMEPRAZOLE 20 MG PO CPDR
20.0000 mg | DELAYED_RELEASE_CAPSULE | Freq: Every day | ORAL | 0 refills | Status: DC
Start: 2022-01-20 — End: 2022-06-12

## 2022-01-22 ENCOUNTER — Other Ambulatory Visit: Payer: Medicaid Other

## 2022-01-29 ENCOUNTER — Ambulatory Visit
Admission: RE | Admit: 2022-01-29 | Discharge: 2022-01-29 | Disposition: A | Discharge status: Home / Self Care | Payer: Medicaid Other | Source: Ambulatory Visit | Attending: Family Medicine | Admitting: Family Medicine

## 2022-01-29 DIAGNOSIS — D259 Leiomyoma of uterus, unspecified: Secondary | ICD-10-CM | POA: Diagnosis not present

## 2022-01-29 DIAGNOSIS — N920 Excessive and frequent menstruation with regular cycle: Secondary | ICD-10-CM | POA: Diagnosis not present

## 2022-01-29 DIAGNOSIS — N888 Other specified noninflammatory disorders of cervix uteri: Secondary | ICD-10-CM | POA: Diagnosis not present

## 2022-01-29 DIAGNOSIS — N83209 Unspecified ovarian cyst, unspecified side: Secondary | ICD-10-CM | POA: Diagnosis not present

## 2022-01-30 ENCOUNTER — Other Ambulatory Visit: Payer: Self-pay | Admitting: Student

## 2022-02-10 ENCOUNTER — Other Ambulatory Visit: Payer: Self-pay | Admitting: Obstetrics and Gynecology

## 2022-02-10 NOTE — Patient Instructions (Signed)
Hi Ms. Russi, I am sorry we have missed you today - as a part of your Medicaid benefit, you are eligible for care management and care coordination services at no cost or copay. I was unable to reach you by phone today but would be happy to help you with your health related needs. Please feel free to call me at [phone].  Aida Raider RN, BSN [place]  [facility] - Managed Medicaid High Risk [phone]

## 2022-02-10 NOTE — Patient Outreach (Signed)
Care Coordination  02/10/2022  SHERIA ROSELLO 1980/09/24 [id number]   Medicaid Managed Care   Unsuccessful Outreach Note  02/10/2022 Name: YUMALAY CIRCLE MRN: [id number] DOB: 1980-07-06  Referred by: Serita Butcher, MD Reason for referral : High Risk Managed Medicaid (Unsuccessful telephone outreach)   Third unsuccessful telephone outreach was attempted today. The patient was referred to the case management team for assistance with care management and care coordination. The patient's primary care provider has been notified of our unsuccessful attempts to make or maintain contact with the patient. The care management team is pleased to engage with this patient at any time in the future should he/she be interested in assistance from the care management team.   Follow Up Plan: We have been unable to make contact with the patient for follow up. The care management team is available to follow up with the patient after provider conversation with the patient regarding recommendation for care management engagement and subsequent re-referral to the care management team.   Aida Raider RN, BSN [place] Management Coordinator - Managed [place] High Risk [phone]

## 2022-02-12 ENCOUNTER — Encounter: Payer: Medicaid Other | Admitting: Student

## 2022-02-13 ENCOUNTER — Encounter: Payer: Self-pay | Admitting: Student

## 2022-03-05 ENCOUNTER — Ambulatory Visit: Payer: Medicaid Other | Admitting: Obstetrics and Gynecology

## 2022-04-03 ENCOUNTER — Ambulatory Visit: Payer: Medicaid Other | Admitting: Obstetrics and Gynecology
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
## 2022-05-10 DIAGNOSIS — H5213 Myopia, bilateral: Secondary | ICD-10-CM | POA: Diagnosis not present

## 2022-06-08 ENCOUNTER — Emergency Department (HOSPITAL_COMMUNITY): Payer: Medicaid Other

## 2022-06-08 ENCOUNTER — Encounter (HOSPITAL_COMMUNITY): Payer: Self-pay | Admitting: Emergency Medicine

## 2022-06-08 ENCOUNTER — Ambulatory Visit (HOSPITAL_COMMUNITY)
Admission: EM | Admit: 2022-06-08 | Discharge: 2022-06-08 | Disposition: A | Discharge status: Home / Self Care | Payer: Medicaid Other

## 2022-06-08 ENCOUNTER — Emergency Department (HOSPITAL_COMMUNITY)
Admission: EM | Admit: 2022-06-08 | Discharge: 2022-06-08 | Disposition: A | Discharge status: Home / Self Care | Payer: Medicaid Other | Attending: Emergency Medicine | Admitting: Emergency Medicine

## 2022-06-08 ENCOUNTER — Encounter (HOSPITAL_COMMUNITY): Payer: Self-pay

## 2022-06-08 DIAGNOSIS — S8002XA Contusion of left knee, initial encounter: Secondary | ICD-10-CM | POA: Diagnosis not present

## 2022-06-08 DIAGNOSIS — Z79899 Other long term (current) drug therapy: Secondary | ICD-10-CM | POA: Diagnosis not present

## 2022-06-08 DIAGNOSIS — M419 Scoliosis, unspecified: Secondary | ICD-10-CM | POA: Diagnosis not present

## 2022-06-08 DIAGNOSIS — M25561 Pain in right knee: Secondary | ICD-10-CM | POA: Insufficient documentation

## 2022-06-08 DIAGNOSIS — S7002XA Contusion of left hip, initial encounter: Secondary | ICD-10-CM | POA: Diagnosis not present

## 2022-06-08 DIAGNOSIS — N1831 Chronic kidney disease, stage 3a: Secondary | ICD-10-CM | POA: Insufficient documentation

## 2022-06-08 DIAGNOSIS — R1031 Right lower quadrant pain: Secondary | ICD-10-CM

## 2022-06-08 DIAGNOSIS — S60211A Contusion of right wrist, initial encounter: Secondary | ICD-10-CM

## 2022-06-08 DIAGNOSIS — S40011A Contusion of right shoulder, initial encounter: Secondary | ICD-10-CM

## 2022-06-08 DIAGNOSIS — M25531 Pain in right wrist: Secondary | ICD-10-CM | POA: Diagnosis not present

## 2022-06-08 DIAGNOSIS — Y990 Civilian activity done for income or pay: Secondary | ICD-10-CM | POA: Diagnosis not present

## 2022-06-08 DIAGNOSIS — I129 Hypertensive chronic kidney disease with stage 1 through stage 4 chronic kidney disease, or unspecified chronic kidney disease: Secondary | ICD-10-CM | POA: Diagnosis not present

## 2022-06-08 DIAGNOSIS — M25562 Pain in left knee: Secondary | ICD-10-CM

## 2022-06-08 DIAGNOSIS — M545 Low back pain, unspecified: Secondary | ICD-10-CM

## 2022-06-08 DIAGNOSIS — W19XXXA Unspecified fall, initial encounter: Secondary | ICD-10-CM

## 2022-06-08 DIAGNOSIS — I700 Atherosclerosis of aorta: Secondary | ICD-10-CM | POA: Diagnosis not present

## 2022-06-08 DIAGNOSIS — W010XXA Fall on same level from slipping, tripping and stumbling without subsequent striking against object, initial encounter: Secondary | ICD-10-CM | POA: Diagnosis not present

## 2022-06-08 DIAGNOSIS — R0781 Pleurodynia: Secondary | ICD-10-CM | POA: Diagnosis not present

## 2022-06-08 DIAGNOSIS — R109 Unspecified abdominal pain: Secondary | ICD-10-CM | POA: Diagnosis not present

## 2022-06-08 DIAGNOSIS — Z043 Encounter for examination and observation following other accident: Secondary | ICD-10-CM | POA: Diagnosis not present

## 2022-06-08 LAB — COMPREHENSIVE METABOLIC PANEL
ALT: 14 U/L (ref 0–44)
AST: 18 U/L (ref 15–41)
Albumin: 3.5 g/dL (ref 3.5–5.0)
Alkaline Phosphatase: 58 U/L (ref 38–126)
Anion gap: 11 (ref 5–15)
BUN: 12 mg/dL (ref 6–20)
CO2: 20 mmol/L — ABNORMAL LOW (ref 22–32)
Calcium: 8.7 mg/dL — ABNORMAL LOW (ref 8.9–10.3)
Chloride: 107 mmol/L (ref 98–111)
Creatinine, Ser: 1.11 mg/dL — ABNORMAL HIGH (ref 0.44–1.00)
GFR, Estimated: >60 mL/min (ref >60)
Glucose, Bld: 78 mg/dL (ref 70–99)
Potassium: 3.6 mmol/L (ref 3.5–5.1)
Sodium: 138 mmol/L (ref 135–145)
Total Bilirubin: 0.5 mg/dL (ref 0.3–1.2)
Total Protein: 6.7 g/dL (ref 6.5–8.1)

## 2022-06-08 LAB — CBC WITH DIFFERENTIAL/PLATELET
Abs Immature Granulocytes: 0.02 10*3/uL (ref 0.00–0.07)
Basophils Absolute: 0 10*3/uL (ref 0.0–0.1)
Basophils Relative: 1 %
Eosinophils Absolute: 0.1 10*3/uL (ref 0.0–0.5)
Eosinophils Relative: 2 %
HCT: 31.4 % — ABNORMAL LOW (ref 36.0–46.0)
Hemoglobin: 9.2 g/dL — ABNORMAL LOW (ref 12.0–15.0)
Immature Granulocytes: 0 %
Lymphocytes Relative: 19 %
Lymphs Abs: 1.2 10*3/uL (ref 0.7–4.0)
MCH: 24.3 pg — ABNORMAL LOW (ref 26.0–34.0)
MCHC: 29.3 g/dL — ABNORMAL LOW (ref 30.0–36.0)
MCV: 83.1 fL (ref 80.0–100.0)
Monocytes Absolute: 0.3 10*3/uL (ref 0.1–1.0)
Monocytes Relative: 5 %
Neutro Abs: 4.6 10*3/uL (ref 1.7–7.7)
Neutrophils Relative %: 73 %
Platelets: 240 10*3/uL (ref 150–400)
RBC: 3.78 MIL/uL — ABNORMAL LOW (ref 3.87–5.11)
RDW: 15.9 % — ABNORMAL HIGH (ref 11.5–15.5)
WBC: 6.3 10*3/uL (ref 4.0–10.5)
nRBC: 0 % (ref 0.0–0.2)

## 2022-06-08 LAB — URINALYSIS, MICROSCOPIC (REFLEX): Bacteria, UA: NONE SEEN

## 2022-06-08 LAB — URINALYSIS, ROUTINE W REFLEX MICROSCOPIC
Bilirubin Urine: NEGATIVE
Glucose, UA: NEGATIVE mg/dL
Ketones, ur: NEGATIVE mg/dL
Leukocytes,Ua: NEGATIVE
Nitrite: NEGATIVE
Protein, ur: NEGATIVE mg/dL
Specific Gravity, Urine: 1.025 (ref 1.005–1.030)
pH: 6 (ref 5.0–8.0)

## 2022-06-08 LAB — LIPASE, BLOOD: Lipase: 27 U/L (ref 11–51)

## 2022-06-08 LAB — I-STAT BETA HCG BLOOD, ED (MC, WL, AP ONLY): I-stat hCG, quantitative: <5 m[IU]/mL (ref <5)

## 2022-06-08 MED ORDER — IOHEXOL 350 MG/ML SOLN
75.0000 mL | Freq: Once | INTRAVENOUS | Status: AC | PRN
  Administered 2022-06-08: 75 mL via INTRAVENOUS

## 2022-06-08 MED ORDER — HYDROCODONE-ACETAMINOPHEN 5-325 MG PO TABS
1.0000 | ORAL_TABLET | Freq: Once | ORAL | Status: AC
  Administered 2022-06-08: 1 via ORAL
  Filled 2022-06-08: qty 1

## 2022-06-08 MED ORDER — HYDROCODONE-ACETAMINOPHEN 5-325 MG PO TABS: 1.0000 | ORAL_TABLET | Freq: Four times a day (QID) | ORAL | 0 refills | Status: DC | PRN

## 2022-06-08 NOTE — ED Notes (Signed)
Patient is being discharged from the Urgent Care and sent to the Emergency Department via private vehicle . Per Nelda Severe, PA, patient is in need of higher level of care due to multiple areas of significant tenderness following a fall, and HTN. Patient is aware and verbalizes understanding of plan of care.  Vitals:   06/08/22 1646  BP: (!) 208/111  Pulse: (!) 58  Resp: 16  Temp: 97.9 F (36.6 C)  SpO2: 98%

## 2022-06-08 NOTE — Discharge Instructions (Signed)
Advised to report to the emergency room for a higher level of care due to the numerous areas of injury and with the association of severe abdominal pain and hypertensive urgency.

## 2022-06-08 NOTE — ED Triage Notes (Signed)
Pt sent here from UC with c/o fall, , pt slipped and fell at work on Friday and is c/o abd pain along with right wrist and left knee pain ,

## 2022-06-08 NOTE — ED Notes (Signed)
Patient transported to CT scan . 

## 2022-06-08 NOTE — Discharge Instructions (Addendum)
You were evaluated today for abdominal pain, right-sided wrist pain, and knee pain.  Your x-ray showed a possible chronic injury to the right wrist with some small bone fragments.  You are placed in the wrist brace today for support.  Please follow-up with orthopedics.  I have attached their contact information.  You may also discuss your left pain with orthopedics.  Your abdominal pain may be due to your underlying uterine fibroids.  Please follow-up as needed with your OB/GYN.  Please take ibuprofen as needed for inflammation and acetaminophen as needed for pain.  If you develop any life-threatening symptoms please return to the emergency department.
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
## 2022-06-08 NOTE — ED Triage Notes (Signed)
Pt fell at work yesterday. Pt c/o right/left shoulder pain. Pt c/o abdominal pain and right wrist pain. Pain 10/10. Denies hitting her head or LOS.

## 2022-06-08 NOTE — ED Provider Notes (Signed)
[facility] Provider Note   CSN: [id number] Arrival date & time: 06/08/22  1726     History  Chief Complaint  Patient presents with   Fall   Abdominal Pain    Jeanette Gamble is a 42 y.o. female.  Patient presents emergency department for evaluation secondary to a fall.  Patient states that on Friday she slipped on the floor, falling onto her right and left sides.  She states that she has pain in the right wrist, bilateral knees, and right sided abdominal pain.  She was evaluated from the urgent care recommend she come to the emergency department due to the patient laxity with multiple body areas involved and the presence of severe abdominal pain.  The patient does endorse previous right-sided wrist pain but states this is much worse and complaining of some swelling around the joint.  Patient states in general the pain has been increasing for the past 2 days.  The abdominal pain is making the patient feel nauseated.  She states that she has not been able to eat as normal but is drinking fluids.  Past medical history significant for hypertension, chronic gout, obesity, menorrhagia, major depressive disorder, GERD, effusion of bursa of right knee, stage IIIa chronic kidney disease, uterine fibroids The patient states that she wonders if the fall may have aggravated underlying uterine fibroids as this pain does feel somewhat similar  HPI     Home Medications Prior to Admission medications   Medication Sig Start Date End Date Taking? Authorizing Provider  acetaminophen (TYLENOL) 500 MG tablet Take 2 tablets (1,000 mg total) by mouth 4 (four) times daily. Patient not taking: Reported on 01/02/2022 05/28/21   Timothy Lasso, MD  acetaminophen-codeine (TYLENOL #3) 300-30 MG tablet Take 1 tablet by mouth every 6 (six) hours as needed for severe pain. 11/19/21   Lucious Groves, DO  allopurinol (ZYLOPRIM) 100 MG tablet Take 2 tablets (200 mg total) by  mouth daily. 07/10/21   Lajean Manes, MD  amLODipine (NORVASC) 10 MG tablet Take 1 tablet (10 mg total) by mouth daily. 07/10/21   Lajean Manes, MD  Colchicine 0.6 MG CAPS TAKE ONE CAPSULE BY MOUTH ONCE DAILY 01/30/22   Virl Axe, MD  diclofenac Sodium (VOLTAREN) 1 % GEL APPLY 2 GRAMS TOPICALLY 4 (FOUR) TIMES DAILY. 06/24/21   Harvie Heck, MD  ferrous sulfate 325 (65 FE) MG tablet Take 1 tablet (325 mg total) by mouth every Monday, Wednesday, and Friday. 05/29/21   Timothy Lasso, MD  hydrALAZINE (APRESOLINE) 50 MG tablet Take 50 mg by mouth every 8 (eight) hours. 06/24/21   [provider]  hydrochlorothiazide (HYDRODIURIL) 25 MG tablet Take 1 tablet (25 mg total) by mouth daily. 07/10/21   Lajean Manes, MD  lisinopril (ZESTRIL) 40 MG tablet Take 1 tablet (40 mg total) by mouth daily. 07/10/21   Lajean Manes, MD  norethindrone (AYGESTIN) 5 MG tablet Take 1 tablet (5 mg total) by mouth daily. 01/02/22   Donnamae Jude, MD  omeprazole (PRILOSEC) 20 MG capsule Take 1 capsule (20 mg total) by mouth daily. 01/20/22 02/19/22  Gaylan Gerold, DO  ondansetron (ZOFRAN) 4 MG tablet Take 1 tablet (4 mg total) by mouth every 8 (eight) hours as needed for up to 12 doses for nausea or vomiting. 01/20/22   Gaylan Gerold, DO  SUMAtriptan (IMITREX) 50 MG tablet TAKE 1 TABLET (50 MG TOTAL) BY MOUTH EVERY 2 (TWO) HOURS AS NEEDED FOR MIGRAINE. MAY REPEAT  IN 2 HOURS IF HEADACHE PERSISTS OR RECURS. 01/30/22   Virl Axe, MD      Allergies    Gabapentin, Naproxen, and Tramadol    Review of Systems   Review of Systems  Gastrointestinal:  Positive for abdominal pain and nausea. Negative for vomiting.  Genitourinary:  Negative for dysuria.  Musculoskeletal:  Positive for arthralgias.    Physical Exam Updated Vital Signs BP (!) 185/127   Pulse 77   Temp 98.5 F (36.9 C) (Oral)   Resp 18   LMP 04/30/2022 (Approximate)   SpO2 98%  Physical Exam Vitals and nursing note reviewed.  Constitutional:       General: She is not in acute distress.    Appearance: She is well-developed.  HENT:     Head: Normocephalic and atraumatic.  Eyes:     Conjunctiva/sclera: Conjunctivae normal.  Cardiovascular:     Rate and Rhythm: Normal rate and regular rhythm.     Heart sounds: No murmur heard. Pulmonary:     Effort: Pulmonary effort is normal. No respiratory distress.     Breath sounds: Normal breath sounds.  Abdominal:     Palpations: Abdomen is soft.     Tenderness: There is abdominal tenderness in the right upper quadrant and right lower quadrant.  Musculoskeletal:        General: No swelling.     Cervical back: Neck supple.  Skin:    General: Skin is warm and dry.     Capillary Refill: Capillary refill takes less than 2 seconds.     Comments: Bruising noted to left elbow  Neurological:     Mental Status: She is alert.  Psychiatric:        Mood and Affect: Mood normal.     ED Results / Procedures / Treatments   Labs (all labs ordered are listed, but only abnormal results are displayed) Labs Reviewed  COMPREHENSIVE METABOLIC PANEL - Abnormal; Notable for the following components:      Result Value   CO2 20 (*)    Creatinine, Ser 1.11 (*)    Calcium 8.7 (*)    All other components within normal limits  CBC WITH DIFFERENTIAL/PLATELET - Abnormal; Notable for the following components:   RBC 3.78 (*)    Hemoglobin 9.2 (*)    HCT 31.4 (*)    MCH 24.3 (*)    MCHC 29.3 (*)    RDW 15.9 (*)    All other components within normal limits  URINALYSIS, ROUTINE W REFLEX MICROSCOPIC - Abnormal; Notable for the following components:   Hgb urine dipstick TRACE (*)    All other components within normal limits  LIPASE, BLOOD  URINALYSIS, MICROSCOPIC (REFLEX)  I-STAT BETA HCG BLOOD, ED ([facility], [facility], [facility] ONLY)    EKG None  Radiology CT ABDOMEN PELVIS W CONTRAST  Result Date: 06/08/2022 CLINICAL DATA:  Abdominal pain, acute, nonlocalized EXAM: CT ABDOMEN AND PELVIS WITH CONTRAST TECHNIQUE:  Multidetector CT imaging of the abdomen and pelvis was performed using the standard protocol following bolus administration of intravenous contrast. RADIATION DOSE REDUCTION: This exam was performed according to the departmental dose-optimization program which includes automated exposure control, adjustment of the mA and/or kV according to patient size and/or use of iterative reconstruction technique. CONTRAST:  18m OMNIPAQUE IOHEXOL 350 MG/ML SOLN COMPARISON:  CT abdomen pelvis 11/18/2021, CT abdomen pelvis 10/21/2017 FINDINGS: Lower chest: No acute abnormality. Hepatobiliary: No focal liver abnormality. No gallstones, gallbladder wall thickening, or pericholecystic fluid. No biliary dilatation. Pancreas: No focal  lesion. Normal pancreatic contour. No surrounding inflammatory changes. No main pancreatic ductal dilatation. Spleen: Normal in size without focal abnormality. Adrenals/Urinary Tract: No adrenal nodule bilaterally. Bilateral kidneys enhance symmetrically. No hydronephrosis. No hydroureter. The urinary bladder is unremarkable. On delayed imaging, there is no urothelial wall thickening and there are no filling defects in the opacified portions of the bilateral collecting systems or ureters. Stomach/Bowel: Stomach is within normal limits. No evidence of bowel wall thickening or dilatation. Appendix appears normal. Vascular/Lymphatic: No abdominal aorta or iliac aneurysm. Mild atherosclerotic plaque of the aorta and its branches. No abdominal, pelvic, or inguinal lymphadenopathy. Reproductive: Markedly enlarged and lobulated uterus with known multiple uterine fibroids. Uterus grossly stable in size. No adnexal lesions. Ovaries are unremarkable. Other: No intraperitoneal free fluid. No intraperitoneal free gas. No organized fluid collection. Musculoskeletal: No abdominal wall hernia or abnormality. Chronic densely sclerotic lesion within the S1 vertebral body likely bone island. No suspicious lytic or blastic  osseous lesions. No acute displaced fracture. Multilevel degenerative changes of the spine. IMPRESSION: 1. Grossly stable markedly enlarged and lobulated uterus with known multiple uterine fibroids. If these are suspected to be the etiology of patient's pain, recommend outpatient gynecologic consultation. 2. No acute intra-abdominal or intrapelvic abnormality. 3.  Aortic Atherosclerosis (ICD10-I70.0). Electronically Signed   By: Iven Finn M.D.   On: 06/08/2022 22:34   DG Ribs Unilateral W/Chest Right  Result Date: 06/08/2022 CLINICAL DATA:  Right-sided rib pain, fall. EXAM: RIGHT RIBS AND CHEST - 3+ VIEW COMPARISON:  05/24/2021. FINDINGS: No fracture or other bone lesions are seen involving the ribs. Dextroscoliosis of the thoracic spine is noted. There is no evidence of pneumothorax or pleural effusion. Both lungs are clear. Heart size and mediastinal contours are within normal limits. There is atherosclerotic calcification of the aorta. IMPRESSION: Negative. Electronically Signed   By: Brett Fairy M.D.   On: 06/08/2022 21:27   DG Wrist Complete Right  Result Date: 06/08/2022 CLINICAL DATA:  Recent fall 2 days ago with right wrist pain, initial encounter EXAM: RIGHT WRIST - COMPLETE 3+ VIEW COMPARISON:  None Available. FINDINGS: No definitive acute fracture or dislocation is noted. Well corticated bony densities are noted adjacent to the trapezium and first [facility] joint likely related to prior trauma. No other focal abnormality is seen. IMPRESSION: No acute fracture is noted. Changes most consistent with prior trauma with small avulsion fragments identified about the trapezium and first [facility] joint. Electronically Signed   By: Inez Catalina M.D.   On: 06/08/2022 18:42   DG Knee Complete 4 Views Left  Result Date: 06/08/2022 CLINICAL DATA:  Recent slip and fall 2 days ago with right knee pain, initial encounter EXAM: LEFT KNEE - COMPLETE 4+ VIEW COMPARISON:  None Available. FINDINGS: Mild degenerative  changes are noted in all 3 joint compartments. No joint effusion is seen. No acute fracture or dislocation is noted. No soft tissue abnormality is seen. IMPRESSION: Degenerative change without acute abnormality. Electronically Signed   By: Inez Catalina M.D.   On: 06/08/2022 18:40    Procedures Procedures    Medications Ordered in ED Medications  HYDROcodone-acetaminophen (NORCO/VICODIN) 5-325 MG per tablet 1 tablet (1 tablet Oral Given 06/08/22 1952)  iohexol (OMNIPAQUE) 350 MG/ML injection 75 mL (75 mLs Intravenous Contrast Given 06/08/22 2224)    ED Course/ Medical Decision Making/ A&P  Medical Decision Making Amount and/or Complexity of Data Reviewed Labs: ordered. Radiology: ordered.  Risk Prescription drug management.   This patient presents to the ED for concern of bilateral knee pain, right-sided wrist pain, abdominal pain secondary to a fall, this involves an extensive number of treatment options, and is a complaint that carries with it a high risk of complications and morbidity.  The differential diagnosis includes intra-abdominal etiology, bruising, rib fracture, wrist or knee/patella fracture, dislocation, soft tissue injuries, others   Co morbidities that complicate the patient evaluation  History of uterine fibroids, cervical disc disease, chronic kidney disease, chronic back pain, right knee effusion   Additional history obtained:   External records from outside source obtained and reviewed including urgent care notes from earlier today   Lab Tests:  I Ordered, and personally interpreted labs.  The pertinent results include: Hemoglobin 9.2 (baseline appears to be 10), lipase 27, unremarkable UA, negative i-STAT beta pregnancy test   Imaging Studies ordered:  I ordered imaging studies including plain films of the right-sided ribs, right wrist, left knee, CT abdomen pelvis with contrast I independently visualized and interpreted  imaging which showed no rib fracture, no knee fracture or dislocation, right-sided wrist with Changes most consistent with prior trauma with small avulsion  fragments identified about the trapezium and first [facility] joint.  CT abdomen pelvis shows: I agree with the radiologist interpretation   Problem List / ED Course / Critical interventions / Medication management   I ordered medication including hydrocodone for pain Reevaluation of the patient after these medicines showed that the patient improved I have reviewed the patients home medicines and have made adjustments as needed   Social Determinants of Health:  Patient has Medicaid as her primary insurance   Test / Admission - Considered:  The patient does have findings suspicious for possible chronic injury to the right wrist versus potentially acute injury.  Patient placed in wrist brace for support with plans for follow-up with orthopedics.  Patient may also follow-up with orthopedics for her left knee pain.  No acute findings were noted on the abdominal CT and there was no rib fracture.  The patient does have uterine fibroids which have caused her pain in the past and this may be what is causing her pain today.  Recommend follow-up with OB/GYN as needed.  Plan to discharge patient home with recommendations for anti-inflammatories and Tylenol as needed.  Patient may ice the knee and the wrist.  Follow-up with orthopedics and OB/GYN        Final Clinical Impression(s) / ED Diagnoses Final diagnoses:  Fall, initial encounter  Right wrist pain  Acute pain of left knee  Right lower quadrant abdominal pain    Rx / DC Orders ED Discharge Orders     None         Ronny Bacon 06/08/22 2257    Cristie Hem, MD 06/08/22 [phone]

## 2022-06-09 ENCOUNTER — Other Ambulatory Visit: Payer: Self-pay | Admitting: Internal Medicine

## 2022-06-09 ENCOUNTER — Other Ambulatory Visit: Payer: Self-pay | Admitting: Student

## 2022-07-03 ENCOUNTER — Encounter: Payer: Medicaid Other | Admitting: Student

## 2022-09-08 ENCOUNTER — Encounter (HOSPITAL_COMMUNITY): Payer: Self-pay

## 2022-09-08 ENCOUNTER — Ambulatory Visit (HOSPITAL_COMMUNITY)
Admission: EM | Admit: 2022-09-08 | Discharge: 2022-09-08 | Disposition: A | Discharge status: Home / Self Care | Payer: 59 | Attending: Urgent Care | Admitting: Urgent Care

## 2022-09-08 DIAGNOSIS — D509 Iron deficiency anemia, unspecified: Secondary | ICD-10-CM | POA: Diagnosis not present

## 2022-09-08 DIAGNOSIS — M2550 Pain in unspecified joint: Secondary | ICD-10-CM | POA: Diagnosis not present

## 2022-09-08 DIAGNOSIS — I10 Essential (primary) hypertension: Secondary | ICD-10-CM | POA: Diagnosis not present

## 2022-09-08 LAB — BASIC METABOLIC PANEL
Anion gap: 9 (ref 5–15)
BUN: 16 mg/dL (ref 6–20)
CO2: 21 mmol/L — ABNORMAL LOW (ref 22–32)
Calcium: 8.9 mg/dL (ref 8.9–10.3)
Chloride: 106 mmol/L (ref 98–111)
Creatinine, Ser: 1.28 mg/dL — ABNORMAL HIGH (ref 0.44–1.00)
GFR, Estimated: 54 mL/min — ABNORMAL LOW (ref >60)
Glucose, Bld: 95 mg/dL (ref 70–99)
Potassium: 3.7 mmol/L (ref 3.5–5.1)
Sodium: 136 mmol/L (ref 135–145)

## 2022-09-08 LAB — C-REACTIVE PROTEIN: CRP: <0.5 mg/dL (ref <1.0)

## 2022-09-08 LAB — SEDIMENTATION RATE: Sed Rate: 18 mm/hr (ref 0–22)

## 2022-09-08 MED ORDER — CARISOPRODOL 350 MG PO TABS: 350.0000 mg | ORAL_TABLET | Freq: Three times a day (TID) | ORAL | 0 refills | Status: DC

## 2022-09-08 MED ORDER — KETOROLAC TROMETHAMINE 30 MG/ML IJ SOLN
30.0000 mg | Freq: Once | INTRAMUSCULAR | Status: AC
  Administered 2022-09-08: 30 mg via INTRAMUSCULAR

## 2022-09-08 MED ORDER — PREDNISONE 10 MG (21) PO TBPK: ORAL_TABLET | Freq: Every day | ORAL | 0 refills | Status: DC

## 2022-09-08 MED ORDER — KETOROLAC TROMETHAMINE 30 MG/ML IJ SOLN
INTRAMUSCULAR | Status: AC
  Filled 2022-09-08: qty 1

## 2022-09-08 NOTE — ED Triage Notes (Signed)
Pt states fell at work on 2/16 and was seen in ED. States workers comp won't give a referral her to Ortho. Pt c/o pain to bilateral arm/forearm/wrist, bilateral knees, rt ankle, and a knot on lt thigh. States taking tylenol with no relief.

## 2022-09-08 NOTE — Discharge Instructions (Addendum)
Please take the prednisone per taper pack directions.  This should help with swelling and inflammation of your muscles.  You might also take the carisoprodol up to 3 times daily as needed, however take with caution as this may make you tired or drowsy.  We will contact you with the results of your labs upon review.  Please follow-up with your primary care physician for further evaluation of your anemia as low iron levels can lead to muscle and joint pain as well.  You must take your BP medications as prescribed by your PCP. Please monitor at home, goal BP 120/80
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
## 2022-09-09 ENCOUNTER — Other Ambulatory Visit: Payer: Self-pay | Admitting: Student

## 2022-09-09 ENCOUNTER — Telehealth (HOSPITAL_COMMUNITY): Payer: Self-pay | Admitting: *Deleted

## 2022-09-09 NOTE — Telephone Encounter (Signed)
Pt states Soma wasn't covered by insurance and she would like a different med called in. Spoke to provider and she states she is happy to change med but this medication will work better than those alt. Meds. She can get it for $35 with [facility] if she would like to do that. Advised pt and she will try [facility] and call pharmacy.

## 2022-09-23 ENCOUNTER — Encounter: Payer: Self-pay | Admitting: *Deleted

## 2022-09-24 ENCOUNTER — Ambulatory Visit (HOSPITAL_COMMUNITY)
Admission: EM | Admit: 2022-09-24 | Discharge: 2022-09-24 | Disposition: A | Discharge status: Home / Self Care | Payer: 59 | Attending: Emergency Medicine | Admitting: Emergency Medicine

## 2022-09-24 ENCOUNTER — Encounter (HOSPITAL_COMMUNITY): Payer: Self-pay | Admitting: *Deleted

## 2022-09-24 DIAGNOSIS — J069 Acute upper respiratory infection, unspecified: Secondary | ICD-10-CM | POA: Diagnosis not present

## 2022-09-24 DIAGNOSIS — M791 Myalgia, unspecified site: Secondary | ICD-10-CM | POA: Insufficient documentation

## 2022-09-24 DIAGNOSIS — J029 Acute pharyngitis, unspecified: Secondary | ICD-10-CM | POA: Insufficient documentation

## 2022-09-24 LAB — POCT RAPID STREP A (OFFICE): Rapid Strep A Screen: NEGATIVE

## 2022-09-24 MED ORDER — FLUTICASONE PROPIONATE 50 MCG/ACT NA SUSP: 2.0000 | Freq: Every day | NASAL | 2 refills | Status: AC

## 2022-09-24 MED ORDER — CYCLOBENZAPRINE HCL 10 MG PO TABS: 10.0000 mg | ORAL_TABLET | Freq: Two times a day (BID) | ORAL | 0 refills | Status: DC | PRN

## 2022-09-24 MED ORDER — BENZONATATE 100 MG PO CAPS: 100.0000 mg | ORAL_CAPSULE | Freq: Three times a day (TID) | ORAL | 0 refills | Status: DC | PRN

## 2022-09-24 NOTE — Discharge Instructions (Signed)
For sore throat, use tylenol and salt water gargles. Try lozenges or throat spray Make sure you are drinking lots of fluids We will call you if anything returns on your throat culture (2-3 days) Continue mucinex for congestion The nasal spray can help with congestion as well as ear pain/pressure  The tessalon are cough pills that can be used three times daily. If this makes you drowsy, take only before bedtime.   You can take the muscle relaxer (flexeril) twice daily. If the medication makes you drowsy, take only at bed time.  Please call your primary care for follow up.
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
## 2022-09-24 NOTE — ED Triage Notes (Signed)
Pt states she started with sore throat Friday then on Saturday she developed cough, congestion and sneezing. She has been taking mucinex but she also needs a note for work.    She got a prednisone taper but she never started it since she was confused on how to take it. Her insurance also doesn't cover soma so she didn't pick it up. (Phone call on 09/09/22)   She would like to know about getting a refill of her tylneol 3 since she didn't get it from her PCP.

## 2022-09-27 LAB — CULTURE, GROUP A STREP (THRC)

## 2022-10-06 ENCOUNTER — Ambulatory Visit: Payer: 59 | Admitting: Obstetrics and Gynecology

## 2022-10-16 ENCOUNTER — Encounter: Payer: 59 | Admitting: Student

## 2022-10-17 ENCOUNTER — Other Ambulatory Visit: Payer: Self-pay | Admitting: Student

## 2022-10-17 DIAGNOSIS — Z1231 Encounter for screening mammogram for malignant neoplasm of breast: Secondary | ICD-10-CM

## 2022-12-24 ENCOUNTER — Encounter: Payer: 59 | Admitting: Internal Medicine

## 2022-12-31 ENCOUNTER — Ambulatory Visit: Payer: Medicaid Other | Admitting: Obstetrics and Gynecology

## 2023-01-07 ENCOUNTER — Encounter: Payer: Self-pay | Admitting: Internal Medicine

## 2023-01-08 ENCOUNTER — Encounter: Payer: 59 | Admitting: Internal Medicine
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
## 2023-01-26 ENCOUNTER — Encounter: Payer: 59 | Admitting: Student

## 2023-01-26 NOTE — Progress Notes (Deleted)
42 year old female with HTN, gout, MDD, and GERD 11/18/2021

## 2023-02-11 ENCOUNTER — Ambulatory Visit: Payer: Medicaid Other | Admitting: Obstetrics and Gynecology

## 2023-03-31 ENCOUNTER — Encounter: Payer: Medicaid Other | Admitting: Student

## 2023-04-20 DIAGNOSIS — F332 Major depressive disorder, recurrent severe without psychotic features: Secondary | ICD-10-CM | POA: Diagnosis not present

## 2023-04-20 DIAGNOSIS — I129 Hypertensive chronic kidney disease with stage 1 through stage 4 chronic kidney disease, or unspecified chronic kidney disease: Secondary | ICD-10-CM | POA: Diagnosis not present

## 2023-04-20 DIAGNOSIS — I10 Essential (primary) hypertension: Secondary | ICD-10-CM | POA: Diagnosis not present

## 2023-04-20 DIAGNOSIS — N924 Excessive bleeding in the premenopausal period: Secondary | ICD-10-CM | POA: Diagnosis not present

## 2023-04-20 DIAGNOSIS — M791 Myalgia, unspecified site: Secondary | ICD-10-CM | POA: Diagnosis not present

## 2023-04-20 DIAGNOSIS — F17200 Nicotine dependence, unspecified, uncomplicated: Secondary | ICD-10-CM | POA: Diagnosis not present

## 2023-04-20 DIAGNOSIS — M2550 Pain in unspecified joint: Secondary | ICD-10-CM | POA: Diagnosis not present

## 2023-04-20 DIAGNOSIS — M1A9XX0 Chronic gout, unspecified, without tophus (tophi): Secondary | ICD-10-CM | POA: Diagnosis not present

## 2023-04-20 DIAGNOSIS — L301 Dyshidrosis [pompholyx]: Secondary | ICD-10-CM | POA: Insufficient documentation

## 2023-04-20 DIAGNOSIS — D500 Iron deficiency anemia secondary to blood loss (chronic): Secondary | ICD-10-CM | POA: Diagnosis not present

## 2023-04-20 DIAGNOSIS — R011 Cardiac murmur, unspecified: Secondary | ICD-10-CM | POA: Diagnosis not present

## 2023-04-20 DIAGNOSIS — E785 Hyperlipidemia, unspecified: Secondary | ICD-10-CM | POA: Diagnosis not present

## 2023-04-20 DIAGNOSIS — R52 Pain, unspecified: Secondary | ICD-10-CM | POA: Diagnosis not present

## 2023-04-20 DIAGNOSIS — J0100 Acute maxillary sinusitis, unspecified: Secondary | ICD-10-CM | POA: Diagnosis not present

## 2023-04-20 DIAGNOSIS — N1831 Chronic kidney disease, stage 3a: Secondary | ICD-10-CM | POA: Diagnosis not present

## 2023-04-20 HISTORY — DX: Dyshidrosis (pompholyx): L30.1

## 2023-04-29 DIAGNOSIS — D500 Iron deficiency anemia secondary to blood loss (chronic): Secondary | ICD-10-CM | POA: Diagnosis not present

## 2023-04-29 DIAGNOSIS — N921 Excessive and frequent menstruation with irregular cycle: Secondary | ICD-10-CM | POA: Diagnosis not present

## 2023-04-29 DIAGNOSIS — F332 Major depressive disorder, recurrent severe without psychotic features: Secondary | ICD-10-CM | POA: Diagnosis not present

## 2023-04-29 DIAGNOSIS — N1831 Chronic kidney disease, stage 3a: Secondary | ICD-10-CM | POA: Diagnosis not present

## 2023-04-29 DIAGNOSIS — I129 Hypertensive chronic kidney disease with stage 1 through stage 4 chronic kidney disease, or unspecified chronic kidney disease: Secondary | ICD-10-CM | POA: Diagnosis not present

## 2023-04-29 DIAGNOSIS — D259 Leiomyoma of uterus, unspecified: Secondary | ICD-10-CM | POA: Diagnosis not present

## 2023-04-29 DIAGNOSIS — R52 Pain, unspecified: Secondary | ICD-10-CM | POA: Diagnosis not present

## 2023-04-29 NOTE — Progress Notes (Signed)
 Subjective  Patient ID: Jeanette Gamble is a 43 y.o. female who presents today for  Chief Complaint  Patient presents with  . Follow-up    BP Labs  Eval knot on side of neck Severe iron deficiency - started ferrous sulfate  - likely due to severe uterine fibroids HTN Chronic constipation  The following information was reviewed by members of the visit team:  Tobacco  Allergies  Meds  Med Hx  Surg Hx  OB Status  Fam Hx  Soc  Hx     HPI History of Present Illness 43 year old female presents for blood pressure recheck, lab review for severe iron deficiency, and chronic pain assessment post-duloxetine  initiation 1 week ago.  Managing iron deficiency with ferrous sulfate  BID, resumed last week. No GI disturbances. Bowel movements normalized after past diarrhea. Occasional syncope, palpitations, persistent fatigue, and weakness.  Diagnosed with 3 fibroids and a cyst causing menorrhagia and dysmenorrhea. Tubal ligation in 2023 post-ectopic pregnancy, no hysterectomy. No recent ultrasound for fibroids. Under gynecologic care at [facility].  Experiencing back, hip, knee, and shoulder pain post-fall. Developed abdominal pain and nausea post-duloxetine  30 mg initiation. Difficulty sleeping due to pain, despite nighttime blood pressure medication.  Not monitoring blood pressure at home. On lisinopril  and hydrochlorothiazide  for hypertension.  History of stage 3 chronic kidney disease, likely secondary to hypertension.  History of sinus infection, on antibiotics. Resolving neck lymph node previously swollen and painful.  ALLERGIES Allergies discussed during this visit. Medication allergies: Allergic to GABAPENTIN and TRAMADOL . Food allergies:  Environmental allergies:  Other allergies:   MEDICATIONS - Ferrous sulfate  - Duloxetine  - Lisinopril  - Hydrochlorothiazide    Past Surg Hx inc Tubal ligation in 2023 post-ectopic pregnancy.   Objective  Vitals:   04/29/23  1314 04/29/23 1410  BP: (!) 141/105 132/88  BP Location: Left arm Left arm  Patient Position: Sitting Sitting  Pulse: 88   Temp: 96.3 F (35.7 C)   TempSrc: Temporal   SpO2: 100%   Weight: 98.7 kg (217 lb 8 oz)   Height: 1.727 m (5' 8)    Body mass index is 33.07 kg/m. Physical Exam  Physical Exam General Appearance: Normal. Vital signs: Blood pressure: 132/88. HEENT: Sinuses improved. Respiratory: Lungs auscultated. Cardiovascular: Within normal limits. Lymphatic: Palpable neck lymph nodes. Skin: Warm and dry, no rash. Neurological: No gross abnormalities, normal gait. Psychiatric: Mood normal with appropriate Affect.  Most recent PHQ-2 results: Patient Health Questionnaire-2 Score: 5 (04/29/2023  1:11 PM)  Most recent PHQ-9 result: Patient Health Questionnaire-9 Score: 16 (04/29/2023  1:11 PM)  PHQ-9 Question # 9 Thoughts that you would be better off dead or hurting yourself in some way: Over half (04/29/2023  1:11 PM)  Interpretation:   PHQ-9 Interpretation: Positive (Moderately Severe Depression Severity) (04/29/2023  1:11 PM)  Depression Plan: Continue/Recommend counseling/psychology, Referral to Psychiatry placed, Discussed medication, risk/side effects, and Situational stressors reviewed, patient to return to office if symptoms persist or worsen  Assessment/Plan  Diagnoses and all orders for this visit:  Stage 3a chronic kidney disease (CMD) - Cons renal US , nephrology referral for resistant HTN, referred for sleep study already (confirm in works)  - Monitor kidney function closely. 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 - Continue ferrous sulfate  BID until hematology appointment - Prescription for ferrous sulfate  provided - Take with vitamin C or orange juice - Referral to hematology for iron infusion - Home test kit for stool blood - Consider blood transfusion if hemoglobin drops to 6 -     POC Hemoccult Screen (Immunoassay); Future -     POC Hemoccult Screen (Immunoassay); Future -     POC Hemoccult Diagnostic (Immunoassay); Future -     ferrous sulfate  325 mg (65 mg iron) tablet; Take 1 tablet (325 mg total) by mouth 2 (two) times a day. With vitamin C -     Ambulatory referral to Hematology / Oncology; Future -     Ambulatory referral to Gynecology; Future  Uterine leiomyoma, unspecified location - - Likely contributing to anemia via heavy menstrual bleeding. - Order uterine ultrasound - Referral to gynecology for further evaluation and potential hysterectomy - Immediate action if fibroids have grown; otherwise, focus on replenishing blood and iron levels -     Ambulatory referral to Gynecology; Future  Essential hypertension - - Cons renal US , nephrology referral for resistant HTN, referred for sleep study already (confirm in works).   - Elevated blood pressure concerning given reduced blood volume and stage 3 chronic kidney disease. - Adjust lisinopril  and hydrochlorothiazide  to stronger dose - Stop current medications once new prescription starts - Collect urine sample today for protein levels -     azilsartan med-chlorthalidone (EDARBYCLOR) 40-25 mg tab; Take 1 tablet by mouth daily.  Menorrhagia with irregular cycle -     Ambulatory referral to Gynecology; Future  Diffuse pain - - Stomach pain and nausea since starting duloxetine  30 mg 1 week ago. - Temporarily stop duloxetine  - If symptoms persist, restart duloxetine  and inform provider - Prescription for Tylenol  No. 3 for pain management -     acetaminophen -codeine  (TYLENOL  #3) 300-30 mg per  tablet; Take 1 tablet by mouth every 6 (six) hours as needed for moderate pain (4-6). Reviewed w/ pt the risks of chronic narcotic use and that this was to be used sparingly for severe flairs only and not on a chronic basis. No refills and will not be prescribed outside of OV. If she feels she requires chronic narcotics for pain control, she will need to be referred to pain management.  Pt voices understanding and agreement.   Sinus infection - Sinuses improved. - On antibiotics - Resolving neck lymph node previously swollen and painful - Inform provider if lymph nodes swell again   Follow-up in 2 weeks.  On the day of the visit I spent 55 minutes preparing to see the patient, obtaining and/or reviewing separately obtained history, performing a medically appropriate examination and evaluation, counseling and educating the patient/caregiver, ordering medications, test, or procedures, referring and communicating with other health care professionals (not separately reported), and documenting clinical information in the electronic medical record.This time does not include any time spent performing procedures or assesments that are separately billable.  Electronically signed: Elyn Steven Gentry, MD 04/29/2023  1:30 PM

## 2023-05-13 DIAGNOSIS — L301 Dyshidrosis [pompholyx]: Secondary | ICD-10-CM | POA: Diagnosis not present

## 2023-05-13 DIAGNOSIS — I129 Hypertensive chronic kidney disease with stage 1 through stage 4 chronic kidney disease, or unspecified chronic kidney disease: Secondary | ICD-10-CM | POA: Diagnosis not present

## 2023-05-13 DIAGNOSIS — F332 Major depressive disorder, recurrent severe without psychotic features: Secondary | ICD-10-CM | POA: Diagnosis not present

## 2023-05-13 DIAGNOSIS — D500 Iron deficiency anemia secondary to blood loss (chronic): Secondary | ICD-10-CM | POA: Diagnosis not present

## 2023-05-13 DIAGNOSIS — R52 Pain, unspecified: Secondary | ICD-10-CM | POA: Diagnosis not present

## 2023-05-13 DIAGNOSIS — G43829 Menstrual migraine, not intractable, without status migrainosus: Secondary | ICD-10-CM | POA: Diagnosis not present

## 2023-06-08 DIAGNOSIS — M25561 Pain in right knee: Secondary | ICD-10-CM | POA: Diagnosis not present

## 2023-06-08 DIAGNOSIS — M170 Bilateral primary osteoarthritis of knee: Secondary | ICD-10-CM | POA: Diagnosis not present

## 2023-06-08 DIAGNOSIS — G8929 Other chronic pain: Secondary | ICD-10-CM | POA: Diagnosis not present

## 2023-06-08 DIAGNOSIS — M25562 Pain in left knee: Secondary | ICD-10-CM | POA: Diagnosis not present

## 2023-06-08 DIAGNOSIS — M545 Low back pain, unspecified: Secondary | ICD-10-CM | POA: Diagnosis not present

## 2023-06-09 DIAGNOSIS — D509 Iron deficiency anemia, unspecified: Secondary | ICD-10-CM | POA: Diagnosis not present

## 2023-06-09 DIAGNOSIS — N1831 Chronic kidney disease, stage 3a: Secondary | ICD-10-CM | POA: Diagnosis not present

## 2023-06-09 DIAGNOSIS — M545 Low back pain, unspecified: Secondary | ICD-10-CM | POA: Diagnosis not present

## 2023-06-09 DIAGNOSIS — M2550 Pain in unspecified joint: Secondary | ICD-10-CM | POA: Diagnosis not present

## 2023-06-09 DIAGNOSIS — I129 Hypertensive chronic kidney disease with stage 1 through stage 4 chronic kidney disease, or unspecified chronic kidney disease: Secondary | ICD-10-CM | POA: Diagnosis not present

## 2023-06-09 DIAGNOSIS — M5000 Cervical disc disorder with myelopathy, unspecified cervical region: Secondary | ICD-10-CM | POA: Diagnosis not present

## 2023-06-09 DIAGNOSIS — R0981 Nasal congestion: Secondary | ICD-10-CM | POA: Diagnosis not present

## 2023-06-09 DIAGNOSIS — M62830 Muscle spasm of back: Secondary | ICD-10-CM | POA: Diagnosis not present

## 2023-06-09 DIAGNOSIS — F332 Major depressive disorder, recurrent severe without psychotic features: Secondary | ICD-10-CM | POA: Diagnosis not present

## 2023-06-09 DIAGNOSIS — R109 Unspecified abdominal pain: Secondary | ICD-10-CM | POA: Diagnosis not present

## 2023-06-09 DIAGNOSIS — K219 Gastro-esophageal reflux disease without esophagitis: Secondary | ICD-10-CM | POA: Diagnosis not present

## 2023-06-09 DIAGNOSIS — D500 Iron deficiency anemia secondary to blood loss (chronic): Secondary | ICD-10-CM | POA: Diagnosis not present

## 2023-06-09 DIAGNOSIS — R809 Proteinuria, unspecified: Secondary | ICD-10-CM

## 2023-06-09 HISTORY — DX: Proteinuria, unspecified: R80.9

## 2023-06-29 ENCOUNTER — Ambulatory Visit: Payer: Medicaid Other | Admitting: Obstetrics and Gynecology

## 2023-06-30 DIAGNOSIS — M47816 Spondylosis without myelopathy or radiculopathy, lumbar region: Secondary | ICD-10-CM | POA: Diagnosis not present

## 2023-07-02 DIAGNOSIS — M4802 Spinal stenosis, cervical region: Secondary | ICD-10-CM | POA: Diagnosis not present

## 2023-07-02 DIAGNOSIS — M5416 Radiculopathy, lumbar region: Secondary | ICD-10-CM | POA: Diagnosis not present

## 2023-07-02 DIAGNOSIS — G8929 Other chronic pain: Secondary | ICD-10-CM | POA: Diagnosis not present

## 2023-07-06 DIAGNOSIS — I129 Hypertensive chronic kidney disease with stage 1 through stage 4 chronic kidney disease, or unspecified chronic kidney disease: Secondary | ICD-10-CM | POA: Diagnosis not present

## 2023-07-06 DIAGNOSIS — R072 Precordial pain: Secondary | ICD-10-CM | POA: Diagnosis not present

## 2023-07-06 DIAGNOSIS — G43829 Menstrual migraine, not intractable, without status migrainosus: Secondary | ICD-10-CM | POA: Diagnosis not present

## 2023-07-06 DIAGNOSIS — F17200 Nicotine dependence, unspecified, uncomplicated: Secondary | ICD-10-CM | POA: Diagnosis not present

## 2023-07-06 DIAGNOSIS — I1A0 Resistant hypertension: Secondary | ICD-10-CM | POA: Diagnosis not present

## 2023-07-06 DIAGNOSIS — I10 Essential (primary) hypertension: Secondary | ICD-10-CM | POA: Diagnosis not present

## 2023-07-06 DIAGNOSIS — R9431 Abnormal electrocardiogram [ECG] [EKG]: Secondary | ICD-10-CM | POA: Diagnosis not present

## 2023-07-06 DIAGNOSIS — R809 Proteinuria, unspecified: Secondary | ICD-10-CM | POA: Diagnosis not present

## 2023-07-06 DIAGNOSIS — D500 Iron deficiency anemia secondary to blood loss (chronic): Secondary | ICD-10-CM | POA: Diagnosis not present

## 2023-07-06 DIAGNOSIS — F40240 Claustrophobia: Secondary | ICD-10-CM | POA: Diagnosis not present

## 2023-07-06 DIAGNOSIS — N1831 Chronic kidney disease, stage 3a: Secondary | ICD-10-CM | POA: Diagnosis not present

## 2023-07-08 DIAGNOSIS — R079 Chest pain, unspecified: Secondary | ICD-10-CM | POA: Diagnosis not present

## 2023-07-08 DIAGNOSIS — R001 Bradycardia, unspecified: Secondary | ICD-10-CM | POA: Diagnosis not present

## 2023-07-14 DIAGNOSIS — R0989 Other specified symptoms and signs involving the circulatory and respiratory systems: Secondary | ICD-10-CM | POA: Diagnosis not present

## 2023-07-14 DIAGNOSIS — R011 Cardiac murmur, unspecified: Secondary | ICD-10-CM | POA: Diagnosis not present

## 2023-07-25 DIAGNOSIS — M4726 Other spondylosis with radiculopathy, lumbar region: Secondary | ICD-10-CM | POA: Diagnosis not present

## 2023-07-25 DIAGNOSIS — M4712 Other spondylosis with myelopathy, cervical region: Secondary | ICD-10-CM | POA: Diagnosis not present

## 2023-07-28 ENCOUNTER — Ambulatory Visit (INDEPENDENT_AMBULATORY_CARE_PROVIDER_SITE_OTHER): Admitting: Obstetrics and Gynecology

## 2023-07-28 ENCOUNTER — Encounter: Payer: Self-pay | Admitting: Obstetrics and Gynecology

## 2023-07-28 ENCOUNTER — Other Ambulatory Visit (HOSPITAL_COMMUNITY)
Admission: RE | Admit: 2023-07-28 | Discharge: 2023-07-28 | Disposition: A | Discharge status: Home / Self Care | Source: Ambulatory Visit | Attending: Obstetrics and Gynecology | Admitting: Obstetrics and Gynecology

## 2023-07-28 VITALS — BP 156/110 | HR 64 | Wt 239.8 lb

## 2023-07-28 DIAGNOSIS — N946 Dysmenorrhea, unspecified: Secondary | ICD-10-CM | POA: Diagnosis not present

## 2023-07-28 DIAGNOSIS — N920 Excessive and frequent menstruation with regular cycle: Secondary | ICD-10-CM | POA: Insufficient documentation

## 2023-07-28 DIAGNOSIS — D259 Leiomyoma of uterus, unspecified: Secondary | ICD-10-CM | POA: Insufficient documentation

## 2023-07-28 MED ORDER — ACETAMINOPHEN 500 MG PO TABS
500.0000 mg | ORAL_TABLET | Freq: Once | ORAL | Status: AC
  Administered 2023-07-28: 500 mg via ORAL

## 2023-07-28 MED ORDER — NORETHINDRONE ACETATE 5 MG PO TABS: 10.0000 mg | ORAL_TABLET | Freq: Every day | ORAL | 6 refills | Status: DC

## 2023-07-28 NOTE — Progress Notes (Signed)
 Marland Kitchen
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
## 2023-07-29 ENCOUNTER — Encounter: Payer: Self-pay | Admitting: *Deleted

## 2023-07-30 ENCOUNTER — Encounter: Payer: Self-pay | Admitting: Obstetrics and Gynecology

## 2023-07-30 LAB — SURGICAL PATHOLOGY

## 2023-07-31 ENCOUNTER — Ambulatory Visit (HOSPITAL_COMMUNITY)

## 2023-08-03 DIAGNOSIS — M7918 Myalgia, other site: Secondary | ICD-10-CM | POA: Diagnosis not present

## 2023-08-03 DIAGNOSIS — M5416 Radiculopathy, lumbar region: Secondary | ICD-10-CM | POA: Diagnosis not present

## 2023-08-03 DIAGNOSIS — M25561 Pain in right knee: Secondary | ICD-10-CM | POA: Diagnosis not present

## 2023-08-03 DIAGNOSIS — G8929 Other chronic pain: Secondary | ICD-10-CM | POA: Diagnosis not present

## 2023-08-03 DIAGNOSIS — M25562 Pain in left knee: Secondary | ICD-10-CM | POA: Diagnosis not present

## 2023-08-07 ENCOUNTER — Ambulatory Visit (HOSPITAL_COMMUNITY)
Admission: RE | Admit: 2023-08-07 | Discharge: 2023-08-07 | Disposition: A | Discharge status: Home / Self Care | Source: Ambulatory Visit | Attending: Obstetrics and Gynecology

## 2023-08-07 DIAGNOSIS — N946 Dysmenorrhea, unspecified: Secondary | ICD-10-CM

## 2023-08-07 DIAGNOSIS — N852 Hypertrophy of uterus: Secondary | ICD-10-CM | POA: Diagnosis not present

## 2023-08-07 DIAGNOSIS — N83292 Other ovarian cyst, left side: Secondary | ICD-10-CM | POA: Diagnosis not present

## 2023-08-07 DIAGNOSIS — N920 Excessive and frequent menstruation with regular cycle: Secondary | ICD-10-CM | POA: Diagnosis not present

## 2023-08-07 DIAGNOSIS — N939 Abnormal uterine and vaginal bleeding, unspecified: Secondary | ICD-10-CM | POA: Diagnosis not present

## 2023-08-07 DIAGNOSIS — D259 Leiomyoma of uterus, unspecified: Secondary | ICD-10-CM

## 2023-08-12 ENCOUNTER — Other Ambulatory Visit: Payer: Self-pay | Admitting: Obstetrics and Gynecology

## 2023-08-19 DIAGNOSIS — M25561 Pain in right knee: Secondary | ICD-10-CM | POA: Diagnosis not present

## 2023-08-19 DIAGNOSIS — M25562 Pain in left knee: Secondary | ICD-10-CM | POA: Diagnosis not present

## 2023-08-19 DIAGNOSIS — G8929 Other chronic pain: Secondary | ICD-10-CM | POA: Diagnosis not present

## 2023-08-21 ENCOUNTER — Encounter: Payer: Self-pay | Admitting: Obstetrics and Gynecology

## 2023-08-21 DIAGNOSIS — D500 Iron deficiency anemia secondary to blood loss (chronic): Secondary | ICD-10-CM | POA: Diagnosis not present

## 2023-08-21 DIAGNOSIS — R222 Localized swelling, mass and lump, trunk: Secondary | ICD-10-CM | POA: Diagnosis not present

## 2023-08-21 DIAGNOSIS — M791 Myalgia, unspecified site: Secondary | ICD-10-CM | POA: Diagnosis not present

## 2023-08-21 DIAGNOSIS — E559 Vitamin D deficiency, unspecified: Secondary | ICD-10-CM | POA: Diagnosis not present

## 2023-08-21 DIAGNOSIS — R21 Rash and other nonspecific skin eruption: Secondary | ICD-10-CM | POA: Diagnosis not present

## 2023-08-21 DIAGNOSIS — R2243 Localized swelling, mass and lump, lower limb, bilateral: Secondary | ICD-10-CM | POA: Diagnosis not present

## 2023-08-21 DIAGNOSIS — I129 Hypertensive chronic kidney disease with stage 1 through stage 4 chronic kidney disease, or unspecified chronic kidney disease: Secondary | ICD-10-CM | POA: Diagnosis not present

## 2023-08-21 DIAGNOSIS — G8929 Other chronic pain: Secondary | ICD-10-CM | POA: Diagnosis not present

## 2023-08-21 DIAGNOSIS — Z1159 Encounter for screening for other viral diseases: Secondary | ICD-10-CM | POA: Diagnosis not present

## 2023-08-21 DIAGNOSIS — N1831 Chronic kidney disease, stage 3a: Secondary | ICD-10-CM | POA: Diagnosis not present

## 2023-08-21 DIAGNOSIS — M1A0720 Idiopathic chronic gout, left ankle and foot, without tophus (tophi): Secondary | ICD-10-CM | POA: Diagnosis not present

## 2023-08-21 DIAGNOSIS — G43829 Menstrual migraine, not intractable, without status migrainosus: Secondary | ICD-10-CM | POA: Diagnosis not present

## 2023-08-21 DIAGNOSIS — R109 Unspecified abdominal pain: Secondary | ICD-10-CM | POA: Diagnosis not present

## 2023-09-10 DIAGNOSIS — E559 Vitamin D deficiency, unspecified: Secondary | ICD-10-CM | POA: Diagnosis not present

## 2023-09-10 DIAGNOSIS — N1831 Chronic kidney disease, stage 3a: Secondary | ICD-10-CM | POA: Diagnosis not present

## 2023-09-10 DIAGNOSIS — R222 Localized swelling, mass and lump, trunk: Secondary | ICD-10-CM | POA: Diagnosis not present

## 2023-09-10 DIAGNOSIS — M791 Myalgia, unspecified site: Secondary | ICD-10-CM | POA: Diagnosis not present

## 2023-09-10 DIAGNOSIS — D500 Iron deficiency anemia secondary to blood loss (chronic): Secondary | ICD-10-CM | POA: Diagnosis not present

## 2023-09-16 ENCOUNTER — Telehealth: Admitting: Obstetrics and Gynecology

## 2023-09-16 DIAGNOSIS — N920 Excessive and frequent menstruation with regular cycle: Secondary | ICD-10-CM

## 2023-09-16 DIAGNOSIS — N946 Dysmenorrhea, unspecified: Secondary | ICD-10-CM

## 2023-09-16 DIAGNOSIS — D259 Leiomyoma of uterus, unspecified: Secondary | ICD-10-CM
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
## 2023-09-21 DIAGNOSIS — F332 Major depressive disorder, recurrent severe without psychotic features: Secondary | ICD-10-CM | POA: Diagnosis not present

## 2023-09-21 DIAGNOSIS — F17200 Nicotine dependence, unspecified, uncomplicated: Secondary | ICD-10-CM | POA: Diagnosis not present

## 2023-09-21 DIAGNOSIS — N1831 Chronic kidney disease, stage 3a: Secondary | ICD-10-CM | POA: Diagnosis not present

## 2023-09-21 DIAGNOSIS — K219 Gastro-esophageal reflux disease without esophagitis: Secondary | ICD-10-CM | POA: Diagnosis not present

## 2023-09-21 DIAGNOSIS — Z9189 Other specified personal risk factors, not elsewhere classified: Secondary | ICD-10-CM | POA: Diagnosis not present

## 2023-09-21 DIAGNOSIS — M1A9XX0 Chronic gout, unspecified, without tophus (tophi): Secondary | ICD-10-CM | POA: Diagnosis not present

## 2023-09-21 DIAGNOSIS — M5416 Radiculopathy, lumbar region: Secondary | ICD-10-CM | POA: Diagnosis not present

## 2023-09-21 DIAGNOSIS — M5000 Cervical disc disorder with myelopathy, unspecified cervical region: Secondary | ICD-10-CM | POA: Diagnosis not present

## 2023-09-21 DIAGNOSIS — I129 Hypertensive chronic kidney disease with stage 1 through stage 4 chronic kidney disease, or unspecified chronic kidney disease: Secondary | ICD-10-CM | POA: Diagnosis not present

## 2023-10-13 DIAGNOSIS — M5416 Radiculopathy, lumbar region: Secondary | ICD-10-CM | POA: Diagnosis not present

## 2023-10-13 DIAGNOSIS — M47816 Spondylosis without myelopathy or radiculopathy, lumbar region: Secondary | ICD-10-CM | POA: Diagnosis not present

## 2023-10-13 DIAGNOSIS — G8929 Other chronic pain: Secondary | ICD-10-CM | POA: Diagnosis not present

## 2023-10-13 DIAGNOSIS — M25561 Pain in right knee: Secondary | ICD-10-CM | POA: Diagnosis not present

## 2023-10-13 DIAGNOSIS — M25562 Pain in left knee: Secondary | ICD-10-CM | POA: Diagnosis not present

## 2023-10-13 DIAGNOSIS — M7918 Myalgia, other site: Secondary | ICD-10-CM | POA: Diagnosis not present

## 2023-10-16 ENCOUNTER — Telehealth: Payer: Self-pay

## 2023-10-16 NOTE — Telephone Encounter (Signed)
 I called patient to schedule surgery w/ Dr. Jeralyn on 10/26/23. Patient phone rang and then stated my call could not be completed as dialed.

## 2023-11-09 ENCOUNTER — Encounter: Payer: Self-pay | Admitting: Obstetrics and Gynecology

## 2023-11-18 NOTE — Telephone Encounter (Signed)
 Copied from CRM #[id number]. Topic: Medication/Rx - Rx Clarification/Change  >> Nov 18, 2023  3:28 PM Wyline GRADE wrote: Karley, [facility] is calling for medication request (Obtain: Medication Name, Dosage, Quantity, Frequency, Quantity Requested (example: 30-day supply.)   Include all details related to the request(s) below: Patient states that Medicaid will not cover the Nicotrol  10 mg inhaler or the betamethasone cream and want to know if the provider will send in other medications that Medicaid will cover. Patient is also requesting a refill on the Nizoral 2% shampoo. Please advise.     Confirm and type the Best Contact Number below:  Patient/caller contact number:   [phone]          [] Home  [x] Mobile  [] Work [] Other   [x] Okay to leave a voicemail   Medication List:  Current Outpatient Medications:  .  allopurinoL  (ZYLOPRIM ) 100 mg tablet, Take 1 tablet (100 mg total) by mouth daily., Disp: 30 tablet, Rfl: 3 .  amLODIPine  (NORVASC ) 10 mg tablet, Take 1 tablet (10 mg total) by mouth daily., Disp: 30 tablet, Rfl: 3 .  azilsartan med-chlorthalidone (EDARBYCLOR) 40-25 mg tab, Take 1 tablet by mouth daily., Disp: 90 tablet, Rfl: 0 .  betamethasone, augmented, (DIPROLENE AF) 0.05 % cream, Apply topically daily., Disp: 50 g, Rfl: 0 .  carvediloL  (COREG ) 6.25 mg tablet, Take 1 tablet (6.25 mg total) by mouth in the morning and 1 tablet (6.25 mg total) in the evening. Take with meals., Disp: 180 tablet, Rfl: 0 .  colchicine  (MITIGARE ) 0.6 mg cap capsule, TAKE 1 CAPSULE (0.6 MG TOTAL) BY MOUTH DAILY., Disp: 90 capsule, Rfl: 0 .  DULoxetine  (CYMBALTA ) 60 mg capsule, Take 1 capsule (60 mg total) by mouth daily., Disp: 30 capsule, Rfl: 0 .  esomeprazole (NexIUM) 40 mg DR capsule, Take 1 capsule (40 mg total) by mouth every morning before breakfast., Disp: 90 capsule, Rfl: 3 .  ferric maltol 30 mg cap, Take 1 capsule (30 mg total) by mouth 2 (two) times a day., Disp: 180 capsule, Rfl: 1 .   hydrALAZINE  (APRESOLINE ) 50 mg tablet, Take 50 mg by mouth every 8 (eight) hours., Disp: , Rfl:  .  ketoconazole (NIZORAL) 2 % shampoo, Apply topically 2 (two) times a week. X 8 wks, then as needed. Apply to damp scalp, lather, leave on 5 minutes, and rinse, Disp: 120 mL, Rfl: 0 .  methocarbamoL  (ROBAXIN ) 500 mg tablet, TAKE 1 TABLET (500 MG TOTAL) BY MOUTH 3 (THREE) TIMES A DAY AS NEEDED FOR MUSCLE SPASMS., Disp: 90 tablet, Rfl: 0 .  nicotine  (NICOTROL ) 10 mg inhaler, Inhale 1 puff as needed for smoking cessation., Disp: 168 each, Rfl: 2 .  norethindrone  (AYGESTIN ) 5 mg tab, Take 2 tablets by mouth daily., Disp: , Rfl:  .  omeprazole  (PriLOSEC) 20 mg DR capsule, Take 1 capsule (20 mg total) by mouth daily., Disp: 90 capsule, Rfl: 1 .  ondansetron  (ZOFRAN ) 4 mg tablet, TAKE 1 TABLET (4 MG TOTAL) BY MOUTH EVERY 8 (EIGHT) HOURS AS NEEDED FOR NAUSEA OR VOMITING., Disp: 20 tablet, Rfl: 0 .  pregabalin (LYRICA) 75 mg capsule, Take 1 capsule (75 mg total) by mouth 3 (three) times a day., Disp: 90 capsule, Rfl: 1 .  SUMAtriptan  (IMITREX ) 50 mg tablet, Take 1 tablet (50 mg total) by mouth daily as needed for migraine., Disp: 9 tablet, Rfl: 1     Medication Request/Refills: Pharmacy Information (if applicable)   [] Not Applicable       [x]  Pharmacy listed  Send Medication Request to:                                                 [] Pharmacy not listed (added to pharmacy list in Epic) Send Medication Request to:      Listed Pharmacies: [facility] - [place], [place] - [address] - PHONE: [phone] - FAX: [phone]

## 2023-11-30 DIAGNOSIS — N921 Excessive and frequent menstruation with irregular cycle: Secondary | ICD-10-CM | POA: Diagnosis not present

## 2023-11-30 DIAGNOSIS — I1A0 Resistant hypertension: Secondary | ICD-10-CM | POA: Diagnosis not present

## 2023-11-30 DIAGNOSIS — M5000 Cervical disc disorder with myelopathy, unspecified cervical region: Secondary | ICD-10-CM | POA: Diagnosis not present

## 2023-11-30 DIAGNOSIS — R809 Proteinuria, unspecified: Secondary | ICD-10-CM | POA: Diagnosis not present

## 2023-11-30 DIAGNOSIS — M5481 Occipital neuralgia: Secondary | ICD-10-CM | POA: Diagnosis not present

## 2023-11-30 DIAGNOSIS — F332 Major depressive disorder, recurrent severe without psychotic features: Secondary | ICD-10-CM | POA: Diagnosis not present

## 2023-11-30 DIAGNOSIS — N1831 Chronic kidney disease, stage 3a: Secondary | ICD-10-CM | POA: Diagnosis not present

## 2023-11-30 DIAGNOSIS — R52 Pain, unspecified: Secondary | ICD-10-CM | POA: Diagnosis not present

## 2023-11-30 DIAGNOSIS — I129 Hypertensive chronic kidney disease with stage 1 through stage 4 chronic kidney disease, or unspecified chronic kidney disease: Secondary | ICD-10-CM | POA: Diagnosis not present

## 2023-11-30 DIAGNOSIS — E559 Vitamin D deficiency, unspecified: Secondary | ICD-10-CM | POA: Diagnosis not present

## 2023-11-30 DIAGNOSIS — M62830 Muscle spasm of back: Secondary | ICD-10-CM | POA: Diagnosis not present

## 2023-11-30 DIAGNOSIS — F17200 Nicotine dependence, unspecified, uncomplicated: Secondary | ICD-10-CM | POA: Diagnosis not present

## 2023-11-30 DIAGNOSIS — D500 Iron deficiency anemia secondary to blood loss (chronic): Secondary | ICD-10-CM | POA: Diagnosis not present

## 2023-12-10 ENCOUNTER — Telehealth: Payer: Self-pay

## 2023-12-10 NOTE — Telephone Encounter (Signed)
 I called patient to see if she's available for surgery w/ Dr. Jeralyn on 02/11/24. Patient agreed to surgery date and was provided pre-op instructions and surgery details by phone.
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
## 2024-01-11 ENCOUNTER — Ambulatory Visit: Attending: Cardiology | Admitting: Cardiology

## 2024-01-11 DIAGNOSIS — N1831 Chronic kidney disease, stage 3a: Secondary | ICD-10-CM | POA: Diagnosis not present

## 2024-01-11 DIAGNOSIS — R1011 Right upper quadrant pain: Secondary | ICD-10-CM | POA: Diagnosis not present

## 2024-01-11 DIAGNOSIS — M25532 Pain in left wrist: Secondary | ICD-10-CM | POA: Diagnosis not present

## 2024-01-11 DIAGNOSIS — M25531 Pain in right wrist: Secondary | ICD-10-CM | POA: Diagnosis not present

## 2024-01-12 ENCOUNTER — Encounter: Payer: Self-pay | Admitting: Cardiology

## 2024-01-21 NOTE — Progress Notes (Signed)
 Signed hysterectomy statement with patient 01/21/2024.  B'Aisha, RMA

## 2024-02-04 ENCOUNTER — Encounter (HOSPITAL_COMMUNITY): Payer: Self-pay | Admitting: Obstetrics and Gynecology

## 2024-02-08 ENCOUNTER — Encounter: Payer: Self-pay | Admitting: Obstetrics and Gynecology

## 2024-02-10 ENCOUNTER — Encounter (HOSPITAL_COMMUNITY): Payer: Self-pay

## 2024-02-10 ENCOUNTER — Encounter (HOSPITAL_COMMUNITY): Payer: Self-pay | Admitting: Obstetrics and Gynecology

## 2024-02-10 NOTE — Anesthesia Preprocedure Evaluation (Signed)
 Anesthesia Evaluation    Reviewed: Allergy & Precautions, Patient's Chart, lab work & pertinent test results, Unable to perform ROS - Chart review only  Airway        Dental   Pulmonary Current Smoker          Cardiovascular hypertension, Pt. on medications + Peripheral Vascular Disease (Ascending aortic dilation) and + DOE  + Valvular Problems/Murmurs AI   Echo 2021  1. Left ventricular ejection fraction, by estimation, is 60 to 65%. The  left ventricle has normal function. The left ventricle has no regional  wall motion abnormalities. There is moderate left ventricular hypertrophy.  Left ventricular diastolic parameters are indeterminate.   2. Right ventricular systolic function is normal. The right ventricular  size is normal. There is normal pulmonary artery systolic pressure. The  estimated right ventricular systolic pressure is 25.7 mmHg.   3. The mitral valve is normal in structure. No evidence of mitral valve  regurgitation. No evidence of mitral stenosis.   4. The aortic valve is normal in structure. Aortic valve regurgitation is  mild. No aortic stenosis is present. Aortic regurgitation PHT measures 671  msec.   5. Aortic dilatation noted. There is mild dilatation of the ascending  aorta, measuring 40 mm.   6. The inferior vena cava is normal in size with greater than 50%  respiratory variability, suggesting right atrial pressure of 3 mmHg.    Cath 2021 1.  Normal coronary arteries.  The coronary arteries are tortuous likely due to hypertension. 2.  Left ventricular angiography was not performed.  EF was normal by echo.  LVEDP was mildly elevated at 13 mmHg.   Recommendations: No culprit is identified for the patient's chest pain and mildly elevated troponin.  Suspect supply demand ischemia due to elevated blood pressure.  Recommend blood pressure control.    Neuro/Psych  Headaches PSYCHIATRIC DISORDERS  Depression      Neuromuscular disease    GI/Hepatic ,GERD  Medicated and Controlled,,(+)     substance abuse    Endo/Other    Class 3 obesity  Renal/GU Renal InsufficiencyRenal disease     Musculoskeletal  (+) Arthritis ,    Abdominal  (+) + obese  Peds  Hematology  (+) Blood dyscrasia, anemia   Anesthesia Other Findings   Reproductive/Obstetrics                              Anesthesia Physical Anesthesia Plan  ASA: 4  Anesthesia Plan: General   Post-op Pain Management: Tylenol  PO (pre-op)* and Gabapentin PO (pre-op)*   Induction: Intravenous and Rapid sequence  PONV Risk Score and Plan: 3 and Ondansetron , Dexamethasone , Midazolam  and Treatment may vary due to age or medical condition  Airway Management Planned: Oral ETT  Additional Equipment: Arterial line  Intra-op Plan:   Post-operative Plan: Extubation in OR  Informed Consent:   Plan Discussed with:   Anesthesia Plan Comments: (2 x PIV)         Anesthesia Quick Evaluation

## 2024-02-10 NOTE — Progress Notes (Signed)
 Spoke w/ via phone for pre-op interview--- pt (After multiple attempts trying to reach pt w/ number's listed in epic as home number and cell number one could not leave message the other could leave message daily since 02-04-2024. Also, reached out to OR scheduler , [name], for assistance stated numbers were correct.  Then today [name] stated that Dr Jeralyn had reached patient reached patient through MyChart ,  sent message in MyChart to patient this morning @ [phone] requested call to completed pre-op interview for surgery tomorrow.  Received call this evening [phone] from a different number. I did complete patient med list , medical history, and sent surgical instructions via MyChart.  Also, updated patient's phone numbers in epic and her contacts and their numbers  Lab needs dos----  cbc/ bmp/ t&s/ upt/ ekg       Lab results------ no COVID test -----patient states asymptomatic no test needed Arrive at ------- 0530 on 02-11-2024 NPO after MN w/ exception sips of water w/ meds Pre-Surgery Ensure or G2: n/a  Med rec completed Medications to take morning of surgery ----- lyrica, cymbalta , hydralazine , coreg , norvac, prilosec, allopurinol , colchicine , flonase  nasal spray/  if needed imitrex / robaxin  Diabetic medication ----- n/a  GLP1 agonist last dose: n/a GLP1 instructions:  Patient instructed no nail polish to be worn day of surgery Patient instructed to bring photo id and insurance card day of surgery Patient aware to have Driver (ride ) / caregiver    for 24 hours after surgery - mother, [name] Patient Special Instructions -----  after patient received surgical instructions via MyChart.  Reviewed instructions with patient .  Pt verbalized understanding of instructions with no further questions.  Pt will take a shower with what soap she has at home. Pre-Op special Instructions -----  n/a  Patient verbalized understanding of instructions that were given at this phone  interview. Patient denies chest pain, sob, fever, cough at the interview.

## 2024-02-11 ENCOUNTER — Telehealth: Payer: Self-pay | Admitting: Obstetrics and Gynecology

## 2024-02-11 ENCOUNTER — Other Ambulatory Visit: Payer: Self-pay

## 2024-02-11 ENCOUNTER — Encounter (HOSPITAL_COMMUNITY): Payer: Self-pay | Admitting: Obstetrics and Gynecology

## 2024-02-11 ENCOUNTER — Ambulatory Visit (HOSPITAL_COMMUNITY)
Admission: RE | Admit: 2024-02-11 | Discharge: 2024-02-11 | Disposition: A | Discharge status: Home / Self Care | Attending: Obstetrics and Gynecology | Admitting: Obstetrics and Gynecology

## 2024-02-11 ENCOUNTER — Encounter (HOSPITAL_COMMUNITY): Payer: Self-pay | Admitting: Anesthesiology

## 2024-02-11 ENCOUNTER — Encounter (HOSPITAL_COMMUNITY)
Admission: RE | Disposition: A | Discharge status: Home / Self Care | Payer: Self-pay | Source: Home / Self Care | Attending: Obstetrics and Gynecology

## 2024-02-11 DIAGNOSIS — Z538 Procedure and treatment not carried out for other reasons: Secondary | ICD-10-CM | POA: Insufficient documentation

## 2024-02-11 DIAGNOSIS — N939 Abnormal uterine and vaginal bleeding, unspecified: Secondary | ICD-10-CM | POA: Insufficient documentation

## 2024-02-11 DIAGNOSIS — N946 Dysmenorrhea, unspecified: Secondary | ICD-10-CM | POA: Diagnosis not present

## 2024-02-11 DIAGNOSIS — I10 Essential (primary) hypertension: Secondary | ICD-10-CM | POA: Insufficient documentation

## 2024-02-11 DIAGNOSIS — F17210 Nicotine dependence, cigarettes, uncomplicated: Secondary | ICD-10-CM | POA: Diagnosis not present

## 2024-02-11 DIAGNOSIS — R079 Chest pain, unspecified: Secondary | ICD-10-CM | POA: Insufficient documentation

## 2024-02-11 DIAGNOSIS — Z01818 Encounter for other preprocedural examination: Secondary | ICD-10-CM

## 2024-02-11 DIAGNOSIS — D259 Leiomyoma of uterus, unspecified: Secondary | ICD-10-CM

## 2024-02-11 HISTORY — DX: Dysmenorrhea, unspecified: N94.6

## 2024-02-11 HISTORY — DX: Vitamin D deficiency, unspecified: E55.9

## 2024-02-11 HISTORY — DX: Major depressive disorder, single episode, unspecified: F32.9

## 2024-02-11 HISTORY — DX: Personal history of other infectious and parasitic diseases: Z86.19

## 2024-02-11 HISTORY — DX: Presence of spectacles and contact lenses: Z97.3

## 2024-02-11 HISTORY — DX: Leiomyoma of uterus, unspecified: D25.9

## 2024-02-11 HISTORY — DX: Other forms of dyspnea: R06.09

## 2024-02-11 HISTORY — DX: Other chronic pain: G89.29

## 2024-02-11 HISTORY — DX: Resistant hypertension: I1A.0

## 2024-02-11 HISTORY — DX: Chronic kidney disease, stage 3 unspecified: N18.30

## 2024-02-11 HISTORY — DX: Personal history of other complications of pregnancy, childbirth and the puerperium: Z87.59

## 2024-02-11 HISTORY — DX: Other cervical disc degeneration, unspecified cervical region: M50.30

## 2024-02-11 HISTORY — DX: Iron deficiency anemia secondary to blood loss (chronic): D50.0

## 2024-02-11 HISTORY — DX: Abnormal uterine and vaginal bleeding, unspecified: N93.9

## 2024-02-11 LAB — POCT PREGNANCY, URINE: Preg Test, Ur: NEGATIVE

## 2024-02-11 SURGERY — HYSTERECTOMY, TOTAL, LAPAROSCOPIC, ROBOT-ASSISTED WITH SALPINGECTOMY
Anesthesia: Choice

## 2024-02-11 MED ORDER — ORAL CARE MOUTH RINSE: 15.0000 mL | Freq: Once | OROMUCOSAL | Status: DC

## 2024-02-11 MED ORDER — BUPIVACAINE HCL (PF) 0.5 % IJ SOLN
INTRAMUSCULAR | Status: AC
  Filled 2024-02-11: qty 60

## 2024-02-11 MED ORDER — ACETAMINOPHEN 500 MG PO TABS: 1000.0000 mg | ORAL_TABLET | ORAL | Status: DC

## 2024-02-11 MED ORDER — SODIUM CHLORIDE 0.9 % IV SOLN: INTRAVENOUS | Status: DC

## 2024-02-11 MED ORDER — FLUORESCEIN SODIUM 10 % IV SOLN
INTRAVENOUS | Status: AC
  Filled 2024-02-11: qty 10

## 2024-02-11 MED ORDER — CEFAZOLIN SODIUM-DEXTROSE 2-4 GM/100ML-% IV SOLN: 2.0000 g | INTRAVENOUS | Status: DC

## 2024-02-11 MED ORDER — CHLORHEXIDINE GLUCONATE 0.12 % MT SOLN: 15.0000 mL | Freq: Once | OROMUCOSAL | Status: DC

## 2024-02-11 MED ORDER — LACTATED RINGERS IV SOLN: INTRAVENOUS | Status: DC

## 2024-02-11 MED ORDER — BUPIVACAINE LIPOSOME 1.3 % IJ SUSP
INTRAMUSCULAR | Status: AC
  Filled 2024-02-11: qty 20

## 2024-02-11 MED ORDER — POVIDONE-IODINE 10 % EX SWAB: 2.0000 | Freq: Once | CUTANEOUS | Status: DC

## 2024-02-11 NOTE — H&P (Signed)
 OB/GYN Pre-Op History and Physical  Jeanette Gamble is a 43 y.o. [id number] presenting for RA-TLH, BS, cysto.  Evaluated by anesthesia and noted to have recommendation for cardiology evaluation due to complaint of chest pain and has documented history of aortic dilation. Patient states no chest pain since the summer.  Missed cardiology appointment.       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/2023   Dysmenorrhea    H/O alcohol abuse    quit 03/2008 (previously drank 1-2 bottles of liquor daily)  down to occasional   History of abnormal cervical Pap smear 2013   History of pre-eclampsia    Pt underwent C-section 2/2 preeclampsia 02/20/02    History of trichomoniasis    Iron deficiency anemia due to chronic blood loss 2023   ED  severe anemia-- Hg 6.5 x2 PRBCs and IV iron   MDD (major depressive disorder)    Microalbuminuria 06/09/2023   Migraine headache    Resistant hypertension    followed by pcp   Uterine fibroid    Vitamin D  deficiency    Wears glasses     Past Surgical History:  Procedure Laterality Date   CESAREAN SECTION  02/20/2002   @[facility]  by Dr WENDI Na   DILATION AND EVACUATION N/A 06/27/2021   Procedure: Suction DILATATION AND EVACUATION with Ultrasound guidance;  Surgeon: Cleatus Moccasin, MD;  Location: [facility] OR;  Service: Gynecology;  Laterality: N/A;   LAPAROSCOPIC BILATERAL SALPINGECTOMY Bilateral 06/27/2021   Procedure: LAPAROSCOPIC BILATERAL SALPINGECTOMY WITH REMOVAL OF ECTOPIC PREGNANCY;  Surgeon: Cleatus Moccasin, MD;   Location: [facility] OR;  Service: Gynecology;  Laterality: Bilateral;   LEFT HEART CATH AND CORONARY ANGIOGRAPHY N/A 01/18/2020   Procedure: LEFT HEART CATH AND CORONARY ANGIOGRAPHY;  Surgeon: Darron Deatrice LABOR, MD;  Location: [facility] INVASIVE CV LAB;  Service: Cardiovascular;  Laterality: N/A;    OB History  Gravida Para Term Preterm AB Living  2 1  1 1 1   SAB IAB Ectopic Multiple Live Births    1      # Outcome Date GA Lbr Len/2nd Weight Sex Type Anes PTL Lv  2 Preterm 2003 [redacted]w[redacted]d   M CS-LTranv     1 Ectopic             Social History   Socioeconomic History   Marital status: Single    Spouse name: Not on file   Number of children: 1   Years of education: Not on file   Highest education level: Not on file  Occupational History   Not on file  Tobacco Use   Smoking status: Every Day    Types: Cigarettes   Smokeless tobacco: Never   Tobacco comments:    02-10-2024  pt stated smokes 1 ppd ,  decreasing down for upcoming surgery since last month, now at 6 per day;   started age 43 (72)   smoking for 26 yrs  Vaping Use   Vaping status:  Some Days   Substances: Nicotine , THC   Devices: differet varities  Substance and Sexual Activity   Alcohol use: Yes    Comment: occasionally   Drug use: Not Currently    Comment: 02-10-2024  pt stated occasionally eatable marijuana,  last time 02/10/24   Sexual activity: Yes    Birth control/protection: None  Other Topics Concern   Not on file  Social History Narrative   Has a 60 year old son (2011)   Just quit smoking      Family history of DM, Cancer (type?)   Social Drivers of Corporate investment banker Strain: Not on file  Food Insecurity: Medium Risk (09/21/2023)   Received from [facility]   Hunger Vital Sign    Within the past 12 months, you worried that your food would run out before you got money to buy more: Sometimes true    Within the past 12 months, the food you bought just didn't last and you didn't have money to get more. :  Sometimes true  Transportation Needs: Unmet Transportation Needs (09/21/2023)   Received from [employer]    In the past 12 months, has lack of reliable transportation kept you from medical appointments, meetings, work or from getting things needed for daily living? : Yes  Physical Activity: Not on file  Stress: Not on file  Social Connections: Not on file    Family History  Problem Relation Age of Onset   Scoliosis Mother    Hypertension Mother    Arthritis Mother    Hyperlipidemia Mother     No medications prior to admission.    Allergies  Allergen Reactions   Gabapentin Other (See Comments)    memory loss   Naproxen  Nausea Only    headache   Tramadol  Nausea Only    headache    Review of Systems: Negative except for what is mentioned in HPI.     Physical Exam: BP (!) 145/99   Pulse (!) 56   Temp 97.7 F (36.5 C) (Oral)   Resp 16   Ht 5' 8 (1.727 m)   Wt 113.4 kg   LMP 02/02/2024 (Approximate)   SpO2 97%   BMI 38.01 kg/m  CONSTITUTIONAL: Well-developed, well-nourished and in no acute distress.  HENT:  Normocephalic, atraumatic, External right and left ear normal. Oropharynx is clear and moist EYES: Conjunctivae and EOM are normal. Pupils are equal, round, and reactive to light. No scleral icterus.  NECK: Normal range of motion, supple, no masses SKIN: Skin is warm and dry. No rash noted. Not diaphoretic. No erythema. No pallor. NEUROLGIC: Alert and oriented to person, place, and time. Normal reflexes, muscle tone coordination. No cranial nerve deficit noted. PSYCHIATRIC: Normal mood and affect. Normal behavior. Normal judgment and thought content. RESPIRATORY: Normal effort PELVIC: Deferred   Pertinent Labs/Studies:   Results for orders placed or performed during the hospital encounter of 02/11/24 (from the past 72 hours)  Pregnancy, urine POC     Status: None   Collection Time: 02/11/24  6:03 AM  Result Value Ref Range   Preg Test, Ur  NEGATIVE NEGATIVE    Comment:        THE SENSITIVITY OF THIS METHODOLOGY IS >20 mIU/mL.        Assessment and Plan :Jeanette Gamble is a 43 y.o. [phone] here for surgical management of AUB and dysmenorrhea.   Agree with anesthesia that cardiac workup should be completed prior to proceeding with  surgery. Will reschedule procedure to allow for updated cardiac evaluation given hx of aortic dilation, HTN and tobacco use.     Eisa Necaise, M.D. Minimally Invasive Gynecologic Surgery and Pelvic Pain Specialist Attending Obstetrician & Gynecologist, [facility] [employer], [facility]

## 2024-02-11 NOTE — Telephone Encounter (Signed)
   Request for surgical clearance:  What type of surgery is being performed? Laparoscopic hysterectomy  When is this surgery scheduled? To be rescheduled for December if evaluation completed   Are there any medications that need to be held prior to surgery and how long?no   Name of physician performing surgery? [facility]   What is the office phone and fax number? [facility] [employer] [phone], fax [phone]  ____________________________________________________   For Provider: Peter [place], MD

## 2024-02-11 NOTE — Telephone Encounter (Signed)
   Name: Jeanette Gamble  DOB: December 08, 1980  MRN: [id number]  Primary Cardiologist: None  Chart reviewed as part of pre-operative protocol coverage. The patient has an upcoming visit scheduled with Dr. Krasowski on 02/18/2024 at which time clearance can be addressed in case there are any issues that would impact surgical recommendations.  Laparoscopic hysterectomy is not scheduled until TBD as below. I added preop FYI to appointment note so that provider is aware to address at time of outpatient visit.  Per office protocol the cardiology provider should forward their finalized clearance decision and recommendations regarding antiplatelet therapy to the requesting party below.    I will route this message as FYI to requesting party and remove this message from the preop box as separate preop APP input not needed at this time.   Please call with any questions.  Damien JAYSON Braver, NP  02/11/2024, 12:55 PM

## 2024-02-11 NOTE — Progress Notes (Signed)
 Anesthesiology preop eval Chart review revealed prior echocardiogram done in 12/2019 showed mild aortic insufficiency and mild dilation of ascending aorta. Pt presented to family medicine (care everywhere) 07/06/2023 with chest pain. Referral made to cardiology, but pt states she missed the appointment. I spoke in detail with the patient and Dr. Jeralyn that I believe cardiology evaluation is necessary prior to proceeding with surgery as here chest pain could be due to worsening dilation of her aorta, and/or worsening AI and LV overload.

## 2024-02-11 NOTE — Progress Notes (Signed)
 Dr. Darlyn and Dr. Jeralyn at bedside discussing need for cardiac clearance prior to surgery with pt and mother. Plan for reschedule

## 2024-02-18 ENCOUNTER — Ambulatory Visit: Attending: Cardiology | Admitting: Cardiology

## 2024-02-18 ENCOUNTER — Encounter: Payer: Self-pay | Admitting: Cardiology

## 2024-02-18 VITALS — BP 136/100 | HR 67 | Ht 68.0 in | Wt 262.0 lb

## 2024-02-18 DIAGNOSIS — I10 Essential (primary) hypertension: Secondary | ICD-10-CM

## 2024-02-18 DIAGNOSIS — F17200 Nicotine dependence, unspecified, uncomplicated: Secondary | ICD-10-CM

## 2024-02-18 DIAGNOSIS — R0609 Other forms of dyspnea: Secondary | ICD-10-CM | POA: Diagnosis not present

## 2024-02-18 DIAGNOSIS — E782 Mixed hyperlipidemia: Secondary | ICD-10-CM

## 2024-02-18 DIAGNOSIS — I700 Atherosclerosis of aorta: Secondary | ICD-10-CM

## 2024-02-18 NOTE — Progress Notes (Signed)
 Cardiology Consultation:    Date:  02/18/2024   ID:  Jeanette Gamble, DOB 10-Jun-1980, MRN [id number]  PCP:  Loreli Elyn SAILOR, MD  Cardiologist:  Lamar Fitch, MD   Referring MD: Loreli Elyn SAILOR, MD   No chief complaint on file.   History of Present Illness:    Jeanette Gamble is a 43 y.o. female who is being seen today for the evaluation of I need to have my heart checked at the request of Loreli Elyn SAILOR, MD. past medical history significant for hypertension, chest pain atypical she did have cardiac catheterization done 2021 with normal coronaries, at the same time she got echocardiogram done which showed enlargement of the aortic root with aortic insufficiency.  She is supposed to have hysterectomy but before surgery she was evaluated heart murmur has been heard and she is being disqualified for surgery until evaluated by us .  She said she could difficulty walking or moving around because of chronic back problem when she go to [employer] she uses electric chair to drive around.  Described to have some shortness of breath fatigue tiredness described to have chest pain which is worse with taking deep breath and pressing chest wall.  Does not exercise on the regular basis is not on any diet she does have mild dyslipidemia with last cholesterol checked in 2024 with LDL of 108 HDL 60 not on any medications.  Sadly she still continues to smoke half to 1 pack/day.  Does have family of coronary artery disease some premature, noted in the diet, does not exercise on the regular basis  Past Medical History:  Diagnosis Date   Abnormal uterine bleeding (AUB)    Bradycardia    Chronic gout    Chronic pain    bilateral wrist pain/  knee pain/ back   Chronic radicular lumbar pain    CKD (chronic kidney disease), stage III (HCC)    DDD (degenerative disc disease), cervical    DOE (dyspnea on exertion)    02-10-2024   pt stated SOB w/ stairs, long distance walks,  able to do some household chores but due to  chronic knee pain limited   Dyshidrotic hand dermatitis 04/20/2023   Dysmenorrhea    H/O alcohol abuse    quit 03/2008 (previously drank 1-2 bottles of liquor daily)  down to occasional   History of abnormal cervical Pap smear 2013   History of pre-eclampsia    Pt underwent C-section 2/2 preeclampsia 02/20/02    History of trichomoniasis    Iron deficiency anemia due to chronic blood loss 2023   ED  severe anemia-- Hg 6.5 x2 PRBCs and IV iron   MDD (major depressive disorder)    Microalbuminuria 06/09/2023   Migraine headache    Resistant hypertension    followed by pcp   Uterine fibroid    Vitamin D  deficiency    Wears glasses     Past Surgical History:  Procedure Laterality Date   CESAREAN SECTION  02/20/2002   @[facility]  by Dr WENDI Na   DILATION AND EVACUATION N/A 06/27/2021   Procedure: Suction DILATATION AND EVACUATION with Ultrasound guidance;  Surgeon: Cleatus Moccasin, MD;  Location[facility];  Service: Gynecology;  Laterality: N/A;   LAPAROSCOPIC BILATERAL SALPINGECTOMY Bilateral 06/27/2021   Procedure: LAPAROSCOPIC BILATERAL SALPINGECTOMY WITH REMOVAL OF ECTOPIC PREGNANCY;  Surgeon: Cleatus Moccasin, MD;  Location: [facility] OR;  Service: Gynecology;  Laterality: Bilateral;   LEFT HEART CATH AND CORONARY ANGIOGRAPHY N/A 01/18/2020   Procedure: LEFT  HEART CATH AND CORONARY ANGIOGRAPHY;  Surgeon: Darron Deatrice LABOR, MD;  Location: [facility] INVASIVE CV LAB;  Service: Cardiovascular;  Laterality: N/A;    Current Medications: Current Meds  Medication Sig   allopurinol  (ZYLOPRIM ) 100 MG tablet TAKE 2 TABLETS (200 MG TOTAL) BY MOUTH DAILY.   amLODipine  (NORVASC ) 10 MG tablet TAKE 1 TABLET (10 MG TOTAL) BY MOUTH DAILY.   Azilsartan-Chlorthalidone 40-25 MG TABS Take 1 tablet by mouth daily.   carvedilol  (COREG ) 25 MG tablet Take 25 mg by mouth 2 (two) times daily with a meal.   Cholecalciferol  (VITAMIN D3) 1.25 MG (50000 UT) CAPS Take 1 capsule by mouth once a week. Sunday's   Colchicine  0.6 MG CAPS  TAKE ONE CAPSULE BY MOUTH ONCE DAILY   DULoxetine  (CYMBALTA ) 60 MG capsule Take 60 mg by mouth daily.   Ferric Maltol 30 MG CAPS Take 1 capsule by mouth in the morning and at bedtime.   fluticasone  (FLONASE ) 50 MCG/ACT nasal spray Place 2 sprays into both nostrils daily.   hydrALAZINE  (APRESOLINE ) 50 MG tablet TAKE 1 TABLET (50 MG TOTAL) BY MOUTH EVERY 8 (EIGHT) HOURS. (Patient taking differently: Take 50 mg by mouth every 8 (eight) hours.)   methocarbamol  (ROBAXIN ) 500 MG tablet Take 500 mg by mouth 3 (three) times daily as needed.   norethindrone  (AYGESTIN ) 5 MG tablet Take 2 tablets (10 mg total) by mouth daily.   omeprazole  (PRILOSEC) 20 MG capsule Take 1 capsule by mouth daily.   pregabalin (LYRICA) 100 MG capsule Take 100 mg by mouth 3 (three) times daily.   SUMAtriptan  (IMITREX ) 50 MG tablet Take 50 mg by mouth every 2 (two) hours as needed for migraine.     Allergies:   Gabapentin, Naproxen , and Tramadol    Social History   Socioeconomic History   Marital status: Single    Spouse name: Not on file   Number of children: 1   Years of education: Not on file   Highest education level: Not on file  Occupational History   Not on file  Tobacco Use   Smoking status: Every Day    Types: Cigarettes   Smokeless tobacco: Never   Tobacco comments:    02-10-2024  pt stated smokes 1 ppd ,  decreasing down for upcoming surgery since last month, now at 6 per day;   started age 28 (75)   smoking for 26 yrs  Vaping Use   Vaping status: Some Days   Substances: Nicotine , THC   Devices: differet varities  Substance and Sexual Activity   Alcohol use: Yes    Comment: occasionally   Drug use: Not Currently    Comment: 02-10-2024  pt stated occasionally eatable marijuana,  last time 02/10/24   Sexual activity: Yes    Birth control/protection: None  Other Topics Concern   Not on file  Social History Narrative   Has a 28 year old son (2011)   Just quit smoking      Family history of DM,  Cancer (type?)   Social Drivers of Corporate Investment Banker Strain: Not on file  Food Insecurity: Medium Risk (09/21/2023)   Received from [facility]   Hunger Vital Sign    Within the past 12 months, you worried that your food would run out before you got money to buy more: Sometimes true    Within the past 12 months, the food you bought just didn't last and you didn't have money to get more. : Sometimes true  Transportation  Needs: Unmet Transportation Needs (09/21/2023)   Received from [facility]    In the past 12 months, has lack of reliable transportation kept you from medical appointments, meetings, work or from getting things needed for daily living? : Yes  Physical Activity: Not on file  Stress: Not on file  Social Connections: Not on file     Family History: The patient's family history includes Arthritis in her mother; Hyperlipidemia in her mother; Hypertension in her mother; Scoliosis in her mother. ROS:   Please see the history of present illness.    All 14 point review of systems negative except as described per history of present illness.  EKGs/Labs/Other Studies Reviewed:    The following studies were reviewed today:   EKG:  EKG Interpretation Date/Time:  Thursday February 18 2024 13:51:44 EDT Ventricular Rate:  67 PR Interval:  178 QRS Duration:  84 QT Interval:  426 QTC Calculation: 450 R Axis:   13  Text Interpretation: Normal sinus rhythm Cannot rule out Anterior infarct , age undetermined When compared with ECG of 11-Feb-2024 06:47, Nonspecific T wave abnormality now evident in Lateral leads Confirmed by Bernie Charleston [phone]) on 02/18/2024 1:57:38 PM    Recent Labs: No results found for requested labs within last 365 days.  Recent Lipid Panel    Component Value Date/Time   CHOL 120 01/19/2020 0243   CHOL 124 03/29/2019 1428   TRIG 53 01/19/2020 0243   HDL 48 01/19/2020 0243   HDL 58 03/29/2019 1428   CHOLHDL 2.5 01/19/2020  0243   VLDL 11 01/19/2020 0243   LDLCALC 61 01/19/2020 0243   LDLCALC 54 03/29/2019 1428    Physical Exam:    VS:  BP (!) 136/100   Pulse 67   Ht 5' 8 (1.727 m)   Wt 262 lb (118.8 kg)   LMP 02/02/2024 (Approximate)   SpO2 99%   BMI 39.84 kg/m     Wt Readings from Last 3 Encounters:  02/18/24 262 lb (118.8 kg)  02/11/24 250 lb (113.4 kg)  07/28/23 239 lb 12.8 oz (108.8 kg)     GEN:  Well nourished, well developed in no acute distress HEENT: Normal NECK: No JVD; No carotid bruits LYMPHATICS: No lymphadenopathy CARDIAC: RRR, no murmurs, no rubs, no gallops RESPIRATORY:  Clear to auscultation without rales, wheezing or rhonchi  ABDOMEN: Soft, non-tender, non-distended MUSCULOSKELETAL:  No edema; No deformity  SKIN: Warm and dry NEUROLOGIC:  Alert and oriented x 3 PSYCHIATRIC:  Normal affect   ASSESSMENT:    1. Essential hypertension   2. Atherosclerosis of aortic arch (HCC)   3. Mixed hyperlipidemia   4. Tobacco use disorder    PLAN:    In order of problems listed above:  Heart murmur heard on physical exam.  She does have history of aortic insufficiency with enlargement of the aortic root will do another echocardiogram to check on the lesion. Chest pain very atypical exactly the same that she had in 2021 with no cardiac characteristic, her cardiac catheterization at that time was normal.  I do not think that we will need to investigate it.  Will continue monitoring. Smoking obviously huge probably had a long discussion about this I strongly recommend to quit. Dyslipidemia I do have cholesterol from last year which is not bad, we will continue present management for now. Abnormal EKG with poor our progression anterior precordium raising suspicion for old anterior myocardial infarction, will do echocardiogram to check on that. Cardiovascular preop  evaluation if echocardiogram is fine then should be able to proceed with surgery with no limitations   Medication  Adjustments/Labs and Tests Ordered: Current medicines are reviewed at length with the patient today.  Concerns regarding medicines are outlined above.  Orders Placed This Encounter  Procedures   EKG 12-Lead   No orders of the defined types were placed in this encounter.   Signed, Lamar DOROTHA Fitch, MD, [facility]. 02/18/2024 2:10 PM    [facility]

## 2024-02-18 NOTE — Patient Instructions (Signed)
 Medication Instructions:  Your physician recommends that you continue on your current medications as directed. Please refer to the Current Medication list given to you today.  *If you need a refill on your cardiac medications before your next appointment, please call your pharmacy*   Lab Work: Lipid, AST, ALT- today- [address] 3rd Floor If you have labs (blood work) drawn today and your tests are completely normal, you will receive your results only by: MyChart Message (if you have MyChart) OR A paper copy in the mail If you have any lab test that is abnormal or we need to change your treatment, we will call you to review the results.   Testing/Procedures: Your physician has requested that you have an echocardiogram. Echocardiography is a painless test that uses sound waves to create images of your heart. It provides your doctor with information about the size and shape of your heart and how well your heart's chambers and valves are working. This procedure takes approximately one hour. There are no restrictions for this procedure. Please do NOT wear cologne, perfume, aftershave, or lotions (deodorant is allowed). Please arrive 15 minutes prior to your appointment time.  Please note: We ask at that you not bring children with you during ultrasound (echo/ vascular) testing. Due to room size and safety concerns, children are not allowed in the ultrasound rooms during exams. Our front office staff cannot provide observation of children in our lobby area while testing is being conducted. An adult accompanying a patient to their appointment will only be allowed in the ultrasound room at the discretion of the ultrasound technician under special circumstances. We apologize for any inconvenience.    Follow-Up: At [facility], you and your health needs are our priority.  As part of our continuing mission to provide you with exceptional heart care, we have created designated Provider Care Teams.   These Care Teams include your primary Cardiologist (physician) and Advanced Practice Providers (APPs -  Physician Assistants and Nurse Practitioners) who all work together to provide you with the care you need, when you need it.  We recommend signing up for the patient portal called MyChart.  Sign up information is provided on this After Visit Summary.  MyChart is used to connect with patients for Virtual Visits (Telemedicine).  Patients are able to view lab/test results, encounter notes, upcoming appointments, etc.  Non-urgent messages can be sent to your provider as well.   To learn more about what you can do with MyChart, go to [email or web address].    Your next appointment:   3 month(s)  The format for your next appointment:   In Person  Provider:   Lamar Fitch, MD    Other Instructions NA

## 2024-02-19 LAB — LIPID PANEL
Chol/HDL Ratio: 3.2 ratio (ref 0.0–4.4)
Cholesterol, Total: 204 mg/dL — ABNORMAL HIGH (ref 100–199)
HDL: 64 mg/dL (ref >39)
LDL Chol Calc (NIH): 126 mg/dL — ABNORMAL HIGH (ref 0–99)
Triglycerides: 78 mg/dL (ref 0–149)
VLDL Cholesterol Cal: 14 mg/dL (ref 5–40)

## 2024-02-19 LAB — AST: AST: 22 IU/L (ref 0–40)

## 2024-02-19 LAB — ALT: ALT: 21 IU/L (ref 0–32)

## 2024-02-23 ENCOUNTER — Ambulatory Visit: Payer: Self-pay | Admitting: Cardiology

## 2024-03-03 DIAGNOSIS — G43829 Menstrual migraine, not intractable, without status migrainosus: Secondary | ICD-10-CM | POA: Diagnosis not present

## 2024-03-03 DIAGNOSIS — R0683 Snoring: Secondary | ICD-10-CM | POA: Diagnosis not present

## 2024-03-03 DIAGNOSIS — Z6841 Body Mass Index (BMI) 40.0 and over, adult: Secondary | ICD-10-CM | POA: Diagnosis not present

## 2024-03-07 ENCOUNTER — Ambulatory Visit (HOSPITAL_BASED_OUTPATIENT_CLINIC_OR_DEPARTMENT_OTHER): Admission: RE | Admit: 2024-03-07 | Source: Ambulatory Visit

## 2024-03-15 ENCOUNTER — Ambulatory Visit (HOSPITAL_BASED_OUTPATIENT_CLINIC_OR_DEPARTMENT_OTHER)
Admission: RE | Admit: 2024-03-15 | Discharge: 2024-03-15 | Disposition: A | Discharge status: Home / Self Care | Source: Ambulatory Visit | Attending: Cardiology | Admitting: Cardiology

## 2024-03-15 ENCOUNTER — Other Ambulatory Visit: Payer: Self-pay | Admitting: Obstetrics and Gynecology

## 2024-03-15 DIAGNOSIS — N920 Excessive and frequent menstruation with regular cycle: Secondary | ICD-10-CM

## 2024-03-15 DIAGNOSIS — N946 Dysmenorrhea, unspecified: Secondary | ICD-10-CM

## 2024-03-15 DIAGNOSIS — R0609 Other forms of dyspnea: Secondary | ICD-10-CM | POA: Insufficient documentation

## 2024-03-15 DIAGNOSIS — D259 Leiomyoma of uterus, unspecified: Secondary | ICD-10-CM

## 2024-03-15 LAB — ECHOCARDIOGRAM COMPLETE
AR max vel: 2.92 cm2
AV Area VTI: 2.91 cm2
AV Area mean vel: 2.94 cm2
AV Mean grad: 5 mmHg
AV Peak grad: 8.1 mmHg
AV Vena cont: 0.3 cm
Ao pk vel: 1.42 m/s
Area-P 1/2: 2.68 cm2
Calc EF: 62.7 %
MV M vel: 2.31 m/s
MV Peak grad: 21.3 mmHg
S' Lateral: 2.7 cm
Single Plane A2C EF: 63.5 %
Single Plane A4C EF: 60.6 %

## 2024-03-16 ENCOUNTER — Telehealth: Payer: Self-pay

## 2024-03-16 NOTE — Telephone Encounter (Signed)
 Pt viewed Echo results on My Chart per Dr. Vanetta Shawl note. Routed to PCP.

## 2024-03-24 NOTE — Anesthesia Preprocedure Evaluation (Addendum)
"                                    Anesthesia Evaluation  Patient identified by MRN, date of birth, ID band Patient awake    Reviewed: Allergy & Precautions, H&P , NPO status , Patient's Chart, lab work & pertinent test results, Unable to perform ROS - Chart review only  Airway Mallampati: I  TM Distance: >3 FB Neck ROM: Full    Dental no notable dental hx. (+) Teeth Intact, Dental Advisory Given   Pulmonary neg pulmonary ROS, Current Smoker and Patient abstained from smoking.   Pulmonary exam normal breath sounds clear to auscultation       Cardiovascular hypertension, Pt. on medications + Peripheral Vascular Disease (Ascending aortic dilation) and + DOE  negative cardio ROS Normal cardiovascular exam+ Valvular Problems/Murmurs AI  Rhythm:Regular Rate:Normal  ECHO 11/25   1. Left ventricular ejection fraction, by estimation, is 60 to 65%. Left  ventricular ejection fraction by 3D volume is 70 %. The left ventricle has  normal function. The left ventricle has no regional wall motion  abnormalities. There is moderate left  ventricular hypertrophy. Left ventricular diastolic parameters are  consistent with Grade II diastolic dysfunction (pseudonormalization). The  average left ventricular global longitudinal strain is -24.8 %. The global  longitudinal strain is normal.   2. Right ventricular systolic function is normal. The right ventricular  size is normal.   3. The mitral valve is normal in structure. No evidence of mitral valve  regurgitation. No evidence of mitral stenosis.   4. The aortic valve is normal in structure. Aortic valve regurgitation is  mild. No aortic stenosis is present.   5. Aneurysm of the ascending aorta, measuring 42 mm.   6. The inferior vena cava is normal in size with greater than 50%  respiratory variability, suggesting right atrial pressure of 3 mmHg.      Neuro/Psych  Headaches PSYCHIATRIC DISORDERS  Depression     Neuromuscular  disease negative neurological ROS  negative psych ROS   GI/Hepatic negative GI ROS, Neg liver ROS,GERD  Medicated and Controlled,,(+)     substance abuse  alcohol use  Endo/Other  negative endocrine ROS  Class 3 obesity  Renal/GU Renal InsufficiencyRenal diseasenegative Renal ROS  negative genitourinary   Musculoskeletal negative musculoskeletal ROS (+) Arthritis ,    Abdominal  (+) + obese  Peds negative pediatric ROS (+)  Hematology negative hematology ROS (+) Blood dyscrasia, anemia   Anesthesia Other Findings   Reproductive/Obstetrics negative OB ROS                              Anesthesia Physical Anesthesia Plan  ASA: 3  Anesthesia Plan: General   Post-op Pain Management: Tylenol  PO (pre-op)*, Dilaudid  IV and Precedex    Induction: Intravenous  PONV Risk Score and Plan: 3 and Ondansetron , Dexamethasone , Midazolam  and Treatment may vary due to age or medical condition  Airway Management Planned: Oral ETT  Additional Equipment: None  Intra-op Plan:   Post-operative Plan: Extubation in OR  Informed Consent:   Plan Discussed with: Anesthesiologist and CRNA  Anesthesia Plan Comments: (2 x PIV)         Anesthesia Quick Evaluation  "

## 2024-04-05 NOTE — Telephone Encounter (Signed)
 Copied from CRM #[id number]. Topic: Medication/Rx - Rx Clarification/Change  >> Apr 05, 2024  9:40 AM Mliss HERO wrote: Donna with [facility] is calling for medication request (Obtain: Medication Name, Dosage, Quantity, Frequency, Quantity Requested (example: 30-day supply.)   Include all details related to the request(s) below:  [facility] will not be filling the prescription for  pregabalin (LYRICA) 100 mg capsule The pharmacist recommends sending the prescription to another pharmacy of the patient's choosing.    Confirm and type the Best Contact Number below:  Patient/caller contact number:          [phone]   [] Home  [] Mobile  [x] Work [] Other   [] Okay to leave a voicemail   Medication List:  Current Outpatient Medications:    Accrufer 30 mg cap, TAKE 1 CAPSULE (30 MG TOTAL) BY MOUTH 2 (TWO) TIMES A DAY., Disp: 180 capsule, Rfl: 1   allopurinoL  (ZYLOPRIM ) 100 mg tablet, TAKE 1 TABLET (100 MG TOTAL) BY MOUTH DAILY., Disp: 30 tablet, Rfl: 3   amLODIPine  (NORVASC ) 10 mg tablet, Take 1 tablet (10 mg total) by mouth daily., Disp: 90 tablet, Rfl: 1   azilsartan med-chlorthalidone (EDARBYCLOR) 40-25 mg tab, Take 1 tablet by mouth daily., Disp: 90 tablet, Rfl: 0   betamethasone valerate (VALISONE) 0.1 % cream, Apply topically 2 (two) times a day. For up to 10 days, Disp: 45 g, Rfl: 1   carvediloL  (COREG ) 6.25 mg tablet, Take 1 tablet (6.25 mg total) by mouth in the morning and 1 tablet (6.25 mg total) in the evening. Take with meals., Disp: 180 tablet, Rfl: 0   colchicine  (MITIGARE ) 0.6 mg cap capsule, TAKE 1 CAPSULE (0.6 MG TOTAL) BY MOUTH DAILY., Disp: 90 capsule, Rfl: 0   DULoxetine  (CYMBALTA ) 60 mg capsule, Take 1 capsule (60 mg total) by mouth daily., Disp: 90 capsule, Rfl: 1   ergocalciferol  (VITAMIN D2) 1,250 mcg (50,000 unit) capsule, Take 1 capsule (50,000 Units total) by mouth once a week., Disp: 12 capsule, Rfl: 1   esomeprazole (NexIUM) 40 mg DR  capsule, Take 1 capsule (40 mg total) by mouth every morning before breakfast., Disp: 90 capsule, Rfl: 3   hydrALAZINE  (APRESOLINE ) 50 mg tablet, Take 1 tablet (50 mg total) by mouth every 8 (eight) hours., Disp: 270 tablet, Rfl: 1   ketoconazole (NIZORAL) 2 % shampoo, APPLY TOPICALLY 2 (TWO) TIMES A WEEK. FOR 8 WKS, THEN AS NEEDED. APPLY TO DAMP SCALP, LATHER, LEAVE ON 5 MINUTES, AND RINSE, Disp: 120 mL, Rfl: 0   methocarbamoL  (ROBAXIN ) 500 mg tablet, TAKE 1 TABLET (500 MG TOTAL) BY MOUTH 3 (THREE) TIMES A DAY AS NEEDED FOR MUSCLE SPASMS., Disp: 90 tablet, Rfl: 0   omeprazole  (PriLOSEC) 20 mg DR capsule, Take 1 capsule (20 mg total) by mouth daily., Disp: 90 capsule, Rfl: 1   ondansetron  (ZOFRAN ) 4 mg tablet, Take 1 tablet (4 mg total) by mouth every 8 (eight) hours as needed (or vomiting)., Disp: 20 tablet, Rfl: 0   pregabalin (LYRICA) 100 mg capsule, Take 1 capsule (100 mg total) by mouth 3 (three) times a day., Disp: 90 capsule, Rfl: 2   SUMAtriptan  (IMITREX ) 100 mg tablet, Take 0.5-1 tablets (50-100 mg total) by mouth daily as needed for migraine., Disp: 12 tablet, Rfl: 3     Medication Request/Refills: Pharmacy Information (if applicable)   [] Not Applicable       []  Pharmacy listed  Send Medication Request to:                                                 []   Pharmacy not listed (added to pharmacy list in Epic) Send Medication Request to:      Listed Pharmacies: [facility] - [place], [place] - [address] - PHONE: [phone] - FAX: [phone]

## 2024-04-06 NOTE — Telephone Encounter (Signed)
 Patient called to confirm the new pharmacy for pain meds pending. This will be a delivery.   [facility] - [place], [place] - [address] - PHONE: [phone] [place]: [phone]  Call back number: [phone] [facility] to leave message on call back number

## 2024-04-07 ENCOUNTER — Encounter (HOSPITAL_COMMUNITY): Payer: Self-pay | Admitting: Obstetrics and Gynecology

## 2024-04-07 ENCOUNTER — Encounter (HOSPITAL_COMMUNITY): Payer: Self-pay

## 2024-04-07 NOTE — Progress Notes (Addendum)
 Cardiac clearance note dated 02/18/24 in Epic by Dr Krasowski.      Spoke w/ via phone for pre-op interview--- Jeanette Gamble needs dos----  T&S per surgeon. CBC per protocol, UPT and BMP. Preop Gamble appt. 04/18/24 at 1300        Gamble results------Current EKG in Epic dated 02/18/24 COVID test -----patient states asymptomatic no test needed Arrive at ------- NPO after MN NO Solid Food.  Clear liquids from MN until---0900 Pre-Surgery Ensure or G2:  Med rec completed Medications to take morning of surgery -----Norvasc , Hydralizine, Coreg , Cymbalta , Aygestin , Prilosec, Lyrica, and Imitrex -PRN.  Diabetic medication -----  GLP1 agonist last dose: GLP1 instructions:  Patient instructed no nail polish to be worn day of surgery Patient instructed to bring photo id and insurance card day of surgery Patient aware to have Driver (ride ) / caregiver    for 24 hours after surgery - Mother Jeanette Gamble Patient Special Instructions ----- CHG shower night before surgery. Pre-Op special Instructions -----NS IV fluid, pt has CKD III  Patient verbalized understanding of instructions that were given at this phone interview. Patient denies chest pain, sob, fever, cough at the interview.

## 2024-04-07 NOTE — Progress Notes (Signed)
 Surgical Instructions  Your procedure is scheduled on :  Wednesday December 31st,2025 Report to [facility] Main Entrance A at 10:00 AM, then check in the Admitting office. Any questions or running late day of surgery :  call [phone]  Questions prior to your surgery day:  call [phone], Monday -- Friday 8am - 4pm. If you experience any cold or flu symptoms such as cough, fever, chills, shortness of breath, etc. between now and you scheduled surgery, please notify your surgeon office.   Remember: Do Not eat any food after midnight the night before surgery. You may have clear liquids from midnight night before surgery until 9:00 AM.   Clear liquids allowed are:  Water             Carbonated Beverages (diabetics choose diet or no sugar options)  Clear Tea ( no milk, no honey, etc.)  Black coffee ( NO MILK, CREAM OR POWDERED CREAMER OF ANY KIND)  Sport drinks, like Gatorade (diabetes choose diet or no sugar options)  NO clear liquid after 9:00 AM day of surgery.  This includes No water,  candy,  gum, and mints.  Take these medicines the morning of surgery with A SIPS OF WATER:  Norvasc , Hydralizine, Coreg , Cymbalta , Aygestin , Prilosec, Lyrica.   May take these medicines IF NEEDED:  Imitrex    One week prior to surgery, STOP taking any Aspirin  (unless otherwise instructed by your surgeon) Aleve , Naproxen , ibuprofen , Motrin , Advil , Goody's, BC's, all herbal medications/ supplements, fish oil, and non-prescription vitamins.  Do NOT Smoke (tobacco/ vaping) and Do Not drink alcohol for 24 hours prior to your procedure.  For those patients that use a CPAP.  Please bring your CPAP/ mask/ tubing with them day of surgery . Anesthesia may ask recovery room nurse to use and if you stay the night you be asked to use it.  You will be asked to removed any contacts, glasses, piercing's, hearing aid's, dentures/ partials prior to surgery.  Please bring cases/ container/ solution/ etc., for them  day of surgery.   Patients discharged the day of surgery will NOT be allowed to drive home.  You must have responsible driver and caregiver to stay at home with you the next 24 hours.  SURGICAL WAITING ROOM VISITATION Patients may have no more than 2 support people in the waiting area - if more than 2 , these visitors may rotate.  Pre-op nurse will coordinate an appropriate time for 1 Adult support person, who may not rotate, to accompany patient in pre-op.  Aware some patients may have certain circumstances, speak to pre-op nurse day of surgery.  Children under the age 81 must have an adult with them who is not the patient and must remain in the main waiting area with an adult.  If the patient needs to stay at the hospital during part of their recovery, the visitor guidelines for inpatient rooms apply.  Please refer to the [facility] website for the visitor guidelines for any additional information.  If you received a COVID test during your pre-op visit it is requested that you wear a mask when out in public, stay away from anyone that may not be feeling well and notify your surgeon if you develop symptoms.  If you have been in contact with anyone that has tested positive in the past 10 days notify your surgeon.     [place] - Preparing for Surgery  Before surgery, you can play an important role. Because skin is not sterile,  it needs to be as free of germs as possible. You can reduce the number of germs on your skin by washing with CHG (chlorhexidine  gluconate) soap before surgery. CHG is an antiseptic cleaner which kills germs and bonds with the skin to continue killing germs even after washing. Oral hygiene is also important in reducing the risk of infection. Remember to brush your teeth with your regular toothpaste the morning of surgery.  Please DO NOT use if you have an allergy to CHG or antibacterial soaps. If your skin becomes reddened/irritated stop using the CHG and inform your  Pre-op nurse day of surgery.  DO NOT shave (including legs and genital area) for at least 48 hours prior to your CHG shower.   Please follow these instructions carefully:  Shower with CHG soap the night before surgery. If you choose to wash your hair, wash your hair first as usual with your normal shampoo. After you shampoo, rinse your hair and body thoroughly to remove the shampoo. Use CHG as you would any other liquid soap. You can apply CHG directly to the skin and wash gently with a clean washcloth or shower sponge. Apply the CHG soap to your body ONLY FROM THE NECK DOWN. Do not use on open wounds or open sores. Avoid contact with your eyes, ears, mouth, and genitals (private parts). Wash genitals (private parts) with your normal soap. Wash thoroughly, paying special attention to the area where your surgery will be performed. Thoroughly rinse your body with warm water from the neck down. DO NOT shower/wash with your normal soap after using and rinsing off the CHG soap. DO NOT use lotions, oils, etc., after showering with CHG. Pat yourself dry with a clean towel. Wear clean pajamas. Place clean sheets on your bed the night of your CHG shower and do not sleep with pets.  Day of Surgery  DO NOT Apply any lotions,  powder,  oils,  deodorants (may use underarm deodorant),  cologne/  perfumes  or makeup Do Not wear jewelry /  piercing's/  metal/  permanent jewelry must be removed prior to arrival day of surgery. (No plastic piercing) Do Not wear nail polish,  gel polish,  artificial nails, or any other type of covering on natural finger nails (toe nails are okay) Remember to brush your teeth and rinse mouth out. Put on clean / comfortable clothes. [facility] is not responsible for valuables/ personal belongings

## 2024-04-11 DIAGNOSIS — R52 Pain, unspecified: Secondary | ICD-10-CM | POA: Diagnosis not present

## 2024-04-11 DIAGNOSIS — Z6841 Body Mass Index (BMI) 40.0 and over, adult: Secondary | ICD-10-CM | POA: Diagnosis not present

## 2024-04-11 DIAGNOSIS — M1A2720 Drug-induced chronic gout, left ankle and foot, without tophus (tophi): Secondary | ICD-10-CM | POA: Diagnosis not present

## 2024-04-11 DIAGNOSIS — E559 Vitamin D deficiency, unspecified: Secondary | ICD-10-CM | POA: Diagnosis not present

## 2024-04-11 DIAGNOSIS — N1831 Chronic kidney disease, stage 3a: Secondary | ICD-10-CM | POA: Diagnosis not present

## 2024-04-15 ENCOUNTER — Encounter: Payer: Self-pay | Admitting: Obstetrics and Gynecology

## 2024-04-18 ENCOUNTER — Encounter (HOSPITAL_COMMUNITY)
Admission: RE | Admit: 2024-04-18 | Discharge: 2024-04-18 | Disposition: A | Discharge status: Home / Self Care | Source: Ambulatory Visit | Attending: Obstetrics and Gynecology | Admitting: Obstetrics and Gynecology

## 2024-04-18 DIAGNOSIS — I10 Essential (primary) hypertension: Secondary | ICD-10-CM | POA: Diagnosis not present

## 2024-04-18 DIAGNOSIS — N920 Excessive and frequent menstruation with regular cycle: Secondary | ICD-10-CM | POA: Diagnosis not present

## 2024-04-18 DIAGNOSIS — Z01812 Encounter for preprocedural laboratory examination: Secondary | ICD-10-CM | POA: Diagnosis present

## 2024-04-18 DIAGNOSIS — D259 Leiomyoma of uterus, unspecified: Secondary | ICD-10-CM | POA: Diagnosis not present

## 2024-04-18 LAB — TYPE AND SCREEN
ABO/RH(D): O POS
Antibody Screen: NEGATIVE

## 2024-04-18 LAB — BASIC METABOLIC PANEL WITH GFR
Anion gap: 11 (ref 5–15)
BUN: 22 mg/dL — ABNORMAL HIGH (ref 6–20)
CO2: 25 mmol/L (ref 22–32)
Calcium: 9 mg/dL (ref 8.9–10.3)
Chloride: 101 mmol/L (ref 98–111)
Creatinine, Ser: 1.45 mg/dL — ABNORMAL HIGH (ref 0.44–1.00)
GFR, Estimated: 45 mL/min — ABNORMAL LOW
Glucose, Bld: 95 mg/dL (ref 70–99)
Potassium: 3.6 mmol/L (ref 3.5–5.1)
Sodium: 137 mmol/L (ref 135–145)

## 2024-04-18 LAB — CBC
HCT: 39.5 % (ref 36.0–46.0)
Hemoglobin: 13.4 g/dL (ref 12.0–15.0)
MCH: 30.5 pg (ref 26.0–34.0)
MCHC: 33.9 g/dL (ref 30.0–36.0)
MCV: 90 fL (ref 80.0–100.0)
Platelets: 209 K/uL (ref 150–400)
RBC: 4.39 MIL/uL (ref 3.87–5.11)
RDW: 13.5 % (ref 11.5–15.5)
WBC: 7.5 K/uL (ref 4.0–10.5)
nRBC: 0 % (ref 0.0–0.2)

## 2024-04-19 NOTE — Progress Notes (Signed)
 Called pt regarding arrival time change. Pt informed to arrive at hospital at 0530 04/20/24, reviewed NPO guidelines with clear liquids until 0430. Pt verbalized understanding and denies questions at this time.

## 2024-04-20 ENCOUNTER — Observation Stay (HOSPITAL_COMMUNITY)
Admission: RE | Admit: 2024-04-20 | Discharge: 2024-04-21 | Disposition: A | Discharge status: Home / Self Care | Attending: Obstetrics and Gynecology | Admitting: Obstetrics and Gynecology

## 2024-04-20 ENCOUNTER — Ambulatory Visit (HOSPITAL_COMMUNITY): Payer: Self-pay | Admitting: Anesthesiology

## 2024-04-20 ENCOUNTER — Encounter (HOSPITAL_COMMUNITY): Payer: Self-pay | Admitting: Obstetrics and Gynecology

## 2024-04-20 ENCOUNTER — Ambulatory Visit (HOSPITAL_COMMUNITY)
Admission: RE | Disposition: A | Discharge status: Home / Self Care | Payer: Self-pay | Source: Home / Self Care | Attending: Obstetrics and Gynecology

## 2024-04-20 ENCOUNTER — Other Ambulatory Visit: Payer: Self-pay

## 2024-04-20 DIAGNOSIS — R400 Somnolence: Secondary | ICD-10-CM

## 2024-04-20 DIAGNOSIS — N938 Other specified abnormal uterine and vaginal bleeding: Secondary | ICD-10-CM | POA: Diagnosis not present

## 2024-04-20 DIAGNOSIS — G4710 Hypersomnia, unspecified: Principal | ICD-10-CM

## 2024-04-20 DIAGNOSIS — I129 Hypertensive chronic kidney disease with stage 1 through stage 4 chronic kidney disease, or unspecified chronic kidney disease: Secondary | ICD-10-CM | POA: Insufficient documentation

## 2024-04-20 DIAGNOSIS — Z72820 Sleep deprivation: Secondary | ICD-10-CM | POA: Diagnosis not present

## 2024-04-20 DIAGNOSIS — N736 Female pelvic peritoneal adhesions (postinfective): Secondary | ICD-10-CM | POA: Diagnosis not present

## 2024-04-20 DIAGNOSIS — I10 Essential (primary) hypertension: Secondary | ICD-10-CM

## 2024-04-20 DIAGNOSIS — F17210 Nicotine dependence, cigarettes, uncomplicated: Secondary | ICD-10-CM

## 2024-04-20 DIAGNOSIS — N946 Dysmenorrhea, unspecified: Secondary | ICD-10-CM

## 2024-04-20 DIAGNOSIS — N939 Abnormal uterine and vaginal bleeding, unspecified: Secondary | ICD-10-CM | POA: Diagnosis present

## 2024-04-20 DIAGNOSIS — Z79899 Other long term (current) drug therapy: Secondary | ICD-10-CM | POA: Insufficient documentation

## 2024-04-20 DIAGNOSIS — N879 Dysplasia of cervix uteri, unspecified: Secondary | ICD-10-CM | POA: Insufficient documentation

## 2024-04-20 DIAGNOSIS — D259 Leiomyoma of uterus, unspecified: Secondary | ICD-10-CM | POA: Diagnosis not present

## 2024-04-20 DIAGNOSIS — Z01818 Encounter for other preprocedural examination: Secondary | ICD-10-CM

## 2024-04-20 DIAGNOSIS — N183 Chronic kidney disease, stage 3 unspecified: Secondary | ICD-10-CM | POA: Diagnosis not present

## 2024-04-20 DIAGNOSIS — N920 Excessive and frequent menstruation with regular cycle: Secondary | ICD-10-CM

## 2024-04-20 HISTORY — PX: CYSTOSCOPY: SHX5120

## 2024-04-20 HISTORY — PX: HYSTERECTOMY, TOTAL, LAPAROSCOPIC, ROBOT-ASSISTED WITH SALPINGECTOMY: SHX7587

## 2024-04-20 LAB — CBC
HCT: 40.2 % (ref 36.0–46.0)
Hemoglobin: 13.1 g/dL (ref 12.0–15.0)
MCH: 30.7 pg (ref 26.0–34.0)
MCHC: 32.6 g/dL (ref 30.0–36.0)
MCV: 94.1 fL (ref 80.0–100.0)
Platelets: 146 K/uL — ABNORMAL LOW (ref 150–400)
RBC: 4.27 MIL/uL (ref 3.87–5.11)
RDW: 13.7 % (ref 11.5–15.5)
WBC: 12.4 K/uL — ABNORMAL HIGH (ref 4.0–10.5)
nRBC: 0 % (ref 0.0–0.2)

## 2024-04-20 LAB — BASIC METABOLIC PANEL WITH GFR
Anion gap: 13 (ref 5–15)
BUN: 27 mg/dL — ABNORMAL HIGH (ref 6–20)
CO2: 18 mmol/L — ABNORMAL LOW (ref 22–32)
Calcium: 7.9 mg/dL — ABNORMAL LOW (ref 8.9–10.3)
Chloride: 102 mmol/L (ref 98–111)
Creatinine, Ser: 1.51 mg/dL — ABNORMAL HIGH (ref 0.44–1.00)
GFR, Estimated: 43 mL/min — ABNORMAL LOW
Glucose, Bld: 116 mg/dL — ABNORMAL HIGH (ref 70–99)
Potassium: 4.1 mmol/L (ref 3.5–5.1)
Sodium: 133 mmol/L — ABNORMAL LOW (ref 135–145)

## 2024-04-20 LAB — TSH: TSH: 0.685 u[IU]/mL (ref 0.350–4.500)

## 2024-04-20 LAB — VITAMIN B12: Vitamin B-12: 519 pg/mL (ref 180–914)

## 2024-04-20 LAB — POCT PREGNANCY, URINE: Preg Test, Ur: NEGATIVE

## 2024-04-20 LAB — FOLATE: Folate: >20 ng/mL

## 2024-04-20 LAB — GLUCOSE, CAPILLARY: Glucose-Capillary: 130 mg/dL — ABNORMAL HIGH (ref 70–99)

## 2024-04-20 LAB — URIC ACID: Uric Acid, Serum: 8.2 mg/dL — ABNORMAL HIGH (ref 2.5–7.1)

## 2024-04-20 SURGERY — HYSTERECTOMY, TOTAL, LAPAROSCOPIC, ROBOT-ASSISTED WITH SALPINGECTOMY
Anesthesia: General | Site: Pelvis

## 2024-04-20 MED ORDER — CARVEDILOL 25 MG PO TABS
25.0000 mg | ORAL_TABLET | Freq: Two times a day (BID) | ORAL | Status: DC
  Administered 2024-04-21: 25 mg via ORAL
  Filled 2024-04-20 (×2): qty 1

## 2024-04-20 MED ORDER — LIDOCAINE 2% (20 MG/ML) 5 ML SYRINGE
INTRAMUSCULAR | Status: DC | PRN
  Administered 2024-04-20: 100 mg via INTRAVENOUS

## 2024-04-20 MED ORDER — LACTATED RINGERS IV SOLN: INTRAVENOUS | Status: DC

## 2024-04-20 MED ORDER — ONDANSETRON HCL 4 MG PO TABS: 4.0000 mg | ORAL_TABLET | Freq: Four times a day (QID) | ORAL | Status: DC | PRN

## 2024-04-20 MED ORDER — BUPIVACAINE HCL (PF) 0.5 % IJ SOLN
INTRAMUSCULAR | Status: DC | PRN
  Administered 2024-04-20: 10 mL

## 2024-04-20 MED ORDER — CHLORHEXIDINE GLUCONATE 0.12 % MT SOLN
15.0000 mL | Freq: Once | OROMUCOSAL | Status: AC
  Administered 2024-04-20: 15 mL via OROMUCOSAL

## 2024-04-20 MED ORDER — ACETAMINOPHEN 500 MG PO TABS
ORAL_TABLET | ORAL | Status: AC
  Filled 2024-04-20: qty 2

## 2024-04-20 MED ORDER — ONDANSETRON HCL 4 MG/2ML IJ SOLN
INTRAMUSCULAR | Status: DC | PRN
  Administered 2024-04-20: 4 mg via INTRAVENOUS

## 2024-04-20 MED ORDER — ONDANSETRON HCL 4 MG/2ML IJ SOLN: 4.0000 mg | Freq: Once | INTRAMUSCULAR | Status: DC | PRN

## 2024-04-20 MED ORDER — OXYCODONE HCL 5 MG PO TABS
5.0000 mg | ORAL_TABLET | ORAL | Status: DC | PRN
  Administered 2024-04-21: 10 mg via ORAL
  Filled 2024-04-20: qty 2

## 2024-04-20 MED ORDER — MEPERIDINE HCL 25 MG/ML IJ SOLN: 6.2500 mg | INTRAMUSCULAR | Status: DC | PRN

## 2024-04-20 MED ORDER — PANTOPRAZOLE SODIUM 40 MG PO TBEC
40.0000 mg | DELAYED_RELEASE_TABLET | Freq: Every day | ORAL | Status: DC
  Administered 2024-04-20 – 2024-04-21 (×2): 40 mg via ORAL
  Filled 2024-04-20 (×2): qty 1

## 2024-04-20 MED ORDER — OXYCODONE HCL 5 MG/5ML PO SOLN: 5.0000 mg | Freq: Once | ORAL | Status: AC | PRN

## 2024-04-20 MED ORDER — DEXAMETHASONE SOD PHOSPHATE PF 10 MG/ML IJ SOLN
INTRAMUSCULAR | Status: DC | PRN
  Administered 2024-04-20: 10 mg via INTRAVENOUS

## 2024-04-20 MED ORDER — FENTANYL CITRATE (PF) 100 MCG/2ML IJ SOLN
25.0000 ug | INTRAMUSCULAR | Status: DC | PRN
  Administered 2024-04-20: 50 ug via INTRAVENOUS

## 2024-04-20 MED ORDER — CEFAZOLIN SODIUM-DEXTROSE 2-4 GM/100ML-% IV SOLN
INTRAVENOUS | Status: AC
  Filled 2024-04-20: qty 100

## 2024-04-20 MED ORDER — POVIDONE-IODINE 10 % EX SWAB
2.0000 | Freq: Once | CUTANEOUS | Status: AC
  Administered 2024-04-20: 2 via TOPICAL

## 2024-04-20 MED ORDER — HYDROMORPHONE HCL 1 MG/ML IJ SOLN
INTRAMUSCULAR | Status: AC
  Filled 2024-04-20: qty 0.5

## 2024-04-20 MED ORDER — PROPOFOL 10 MG/ML IV BOLUS
INTRAVENOUS | Status: DC | PRN
  Administered 2024-04-20: 200 mg via INTRAVENOUS

## 2024-04-20 MED ORDER — OXYCODONE HCL 5 MG PO TABS
5.0000 mg | ORAL_TABLET | Freq: Once | ORAL | Status: AC | PRN
  Administered 2024-04-20: 5 mg via ORAL

## 2024-04-20 MED ORDER — BUPIVACAINE LIPOSOME 1.3 % IJ SUSP
INTRAMUSCULAR | Status: DC | PRN
  Administered 2024-04-20: 40 mL

## 2024-04-20 MED ORDER — CEFAZOLIN SODIUM-DEXTROSE 1-4 GM/50ML-% IV SOLN
INTRAVENOUS | Status: AC
  Filled 2024-04-20: qty 50

## 2024-04-20 MED ORDER — FENTANYL CITRATE (PF) 100 MCG/2ML IJ SOLN
INTRAMUSCULAR | Status: AC
  Filled 2024-04-20: qty 2

## 2024-04-20 MED ORDER — SUGAMMADEX SODIUM 200 MG/2ML IV SOLN
INTRAVENOUS | Status: DC | PRN
  Administered 2024-04-20: 400 mg via INTRAVENOUS

## 2024-04-20 MED ORDER — CEFAZOLIN SODIUM-DEXTROSE 2-4 GM/100ML-% IV SOLN
2.0000 g | INTRAVENOUS | Status: AC
  Administered 2024-04-20: 3 g via INTRAVENOUS

## 2024-04-20 MED ORDER — MIDAZOLAM HCL (PF) 2 MG/2ML IJ SOLN
INTRAMUSCULAR | Status: DC | PRN
  Administered 2024-04-20: 2 mg via INTRAVENOUS

## 2024-04-20 MED ORDER — ORAL CARE MOUTH RINSE: 15.0000 mL | Freq: Once | OROMUCOSAL | Status: AC

## 2024-04-20 MED ORDER — LIDOCAINE 2% (20 MG/ML) 5 ML SYRINGE
INTRAMUSCULAR | Status: AC
  Filled 2024-04-20: qty 5

## 2024-04-20 MED ORDER — ROCURONIUM BROMIDE 10 MG/ML (PF) SYRINGE
PREFILLED_SYRINGE | INTRAVENOUS | Status: DC | PRN
  Administered 2024-04-20: 80 mg via INTRAVENOUS
  Administered 2024-04-20: 20 mg via INTRAVENOUS
  Administered 2024-04-20: 10 mg via INTRAVENOUS

## 2024-04-20 MED ORDER — OXYCODONE HCL 5 MG PO TABS
ORAL_TABLET | ORAL | Status: AC
  Filled 2024-04-20: qty 1

## 2024-04-20 MED ORDER — ROCURONIUM BROMIDE 10 MG/ML (PF) SYRINGE
PREFILLED_SYRINGE | INTRAVENOUS | Status: AC
  Filled 2024-04-20: qty 10

## 2024-04-20 MED ORDER — ACETAMINOPHEN 500 MG PO TABS
1000.0000 mg | ORAL_TABLET | Freq: Four times a day (QID) | ORAL | Status: DC
  Administered 2024-04-20 – 2024-04-21 (×5): 1000 mg via ORAL
  Filled 2024-04-20 (×5): qty 2

## 2024-04-20 MED ORDER — EPHEDRINE 5 MG/ML INJ
INTRAVENOUS | Status: AC
  Filled 2024-04-20: qty 5

## 2024-04-20 MED ORDER — SIMETHICONE 80 MG PO CHEW: 80.0000 mg | CHEWABLE_TABLET | Freq: Four times a day (QID) | ORAL | Status: DC | PRN

## 2024-04-20 MED ORDER — BUPIVACAINE LIPOSOME 1.3 % IJ SUSP
INTRAMUSCULAR | Status: AC
  Filled 2024-04-20: qty 20

## 2024-04-20 MED ORDER — HYDRALAZINE HCL 50 MG PO TABS
50.0000 mg | ORAL_TABLET | Freq: Three times a day (TID) | ORAL | Status: DC
  Administered 2024-04-20 – 2024-04-21 (×3): 50 mg via ORAL
  Filled 2024-04-20 (×3): qty 1

## 2024-04-20 MED ORDER — AMLODIPINE BESYLATE 5 MG PO TABS
10.0000 mg | ORAL_TABLET | Freq: Every day | ORAL | Status: DC
  Administered 2024-04-21: 10 mg via ORAL
  Filled 2024-04-20: qty 2

## 2024-04-20 MED ORDER — ONDANSETRON HCL 4 MG/2ML IJ SOLN: 4.0000 mg | Freq: Four times a day (QID) | INTRAMUSCULAR | Status: DC | PRN

## 2024-04-20 MED ORDER — LACTATED RINGERS IV SOLN: INTRAVENOUS | Status: AC

## 2024-04-20 MED ORDER — SUGAMMADEX SODIUM 200 MG/2ML IV SOLN
INTRAVENOUS | Status: AC
  Filled 2024-04-20: qty 2

## 2024-04-20 MED ORDER — BUPIVACAINE HCL (PF) 0.5 % IJ SOLN
INTRAMUSCULAR | Status: AC
  Filled 2024-04-20: qty 30

## 2024-04-20 MED ORDER — ONDANSETRON HCL 4 MG/2ML IJ SOLN
INTRAMUSCULAR | Status: AC
  Filled 2024-04-20: qty 2

## 2024-04-20 MED ORDER — PHENYLEPHRINE 80 MCG/ML (10ML) SYRINGE FOR IV PUSH (FOR BLOOD PRESSURE SUPPORT)
PREFILLED_SYRINGE | INTRAVENOUS | Status: AC
  Filled 2024-04-20: qty 10

## 2024-04-20 MED ORDER — SODIUM CHLORIDE 0.9 % IV SOLN: INTRAVENOUS | Status: AC

## 2024-04-20 MED ORDER — PROPOFOL 10 MG/ML IV BOLUS
INTRAVENOUS | Status: AC
  Filled 2024-04-20: qty 20

## 2024-04-20 MED ORDER — POLYETHYLENE GLYCOL 3350 17 G PO PACK: 17.0000 g | PACK | Freq: Every day | ORAL | Status: DC | PRN

## 2024-04-20 MED ORDER — HYDROMORPHONE HCL 1 MG/ML IJ SOLN: 0.2000 mg | INTRAMUSCULAR | Status: DC | PRN

## 2024-04-20 MED ORDER — METRONIDAZOLE 500 MG/100ML IV SOLN
500.0000 mg | INTRAVENOUS | Status: AC
  Administered 2024-04-20: 500 mg via INTRAVENOUS

## 2024-04-20 MED ORDER — DULOXETINE HCL 20 MG PO CPEP
60.0000 mg | ORAL_CAPSULE | Freq: Every day | ORAL | Status: DC
  Administered 2024-04-21: 60 mg via ORAL
  Filled 2024-04-20: qty 3

## 2024-04-20 MED ORDER — 0.9 % SODIUM CHLORIDE (POUR BTL) OPTIME
TOPICAL | Status: DC | PRN
  Administered 2024-04-20: 1000 mL

## 2024-04-20 MED ORDER — FLUORESCEIN SODIUM 10 % IV SOLN
INTRAVENOUS | Status: AC
  Filled 2024-04-20: qty 5

## 2024-04-20 MED ORDER — METRONIDAZOLE 500 MG/100ML IV SOLN
INTRAVENOUS | Status: AC
  Filled 2024-04-20: qty 100

## 2024-04-20 MED ORDER — MIDAZOLAM HCL 2 MG/2ML IJ SOLN
INTRAMUSCULAR | Status: AC
  Filled 2024-04-20: qty 2

## 2024-04-20 MED ORDER — CHLORHEXIDINE GLUCONATE 0.12 % MT SOLN
OROMUCOSAL | Status: AC
  Filled 2024-04-20: qty 15

## 2024-04-20 MED ORDER — FENTANYL CITRATE (PF) 250 MCG/5ML IJ SOLN
INTRAMUSCULAR | Status: DC | PRN
  Administered 2024-04-20: 100 ug via INTRAVENOUS
  Administered 2024-04-20: 50 ug via INTRAVENOUS
  Administered 2024-04-20: 100 ug via INTRAVENOUS

## 2024-04-20 MED ORDER — FENTANYL CITRATE (PF) 250 MCG/5ML IJ SOLN
INTRAMUSCULAR | Status: AC
  Filled 2024-04-20: qty 5

## 2024-04-20 MED ORDER — PHENYLEPHRINE 80 MCG/ML (10ML) SYRINGE FOR IV PUSH (FOR BLOOD PRESSURE SUPPORT)
PREFILLED_SYRINGE | INTRAVENOUS | Status: DC | PRN
  Administered 2024-04-20: 160 ug via INTRAVENOUS

## 2024-04-20 MED ORDER — ACETAMINOPHEN 500 MG PO TABS
1000.0000 mg | ORAL_TABLET | ORAL | Status: AC
  Administered 2024-04-20: 1000 mg via ORAL

## 2024-04-20 MED ORDER — HYDROMORPHONE HCL 1 MG/ML IJ SOLN
INTRAMUSCULAR | Status: DC | PRN
  Administered 2024-04-20: .5 mg via INTRAVENOUS

## 2024-04-20 SURGICAL SUPPLY — 61 items
BARRIER ADHS 3X4 INTERCEED (GAUZE/BANDAGES/DRESSINGS) IMPLANT
BLADE SURG 10 STRL SS (BLADE) IMPLANT
BLADE SURG 11 STRL SS (BLADE) IMPLANT
CHLORAPREP W/TINT 26 (MISCELLANEOUS) ×2 IMPLANT
COVER BACK TABLE 60X90IN (DRAPES) ×2 IMPLANT
COVER TIP SHEARS 8 DVNC (MISCELLANEOUS) ×2 IMPLANT
DEFOGGER SCOPE WARM SEASHARP (MISCELLANEOUS) ×2 IMPLANT
DERMABOND ADVANCED .7 DNX12 (GAUZE/BANDAGES/DRESSINGS) ×2 IMPLANT
DRAPE ARM DVNC X/XI (DISPOSABLE) ×8 IMPLANT
DRAPE COLUMN DVNC XI (DISPOSABLE) ×2 IMPLANT
DRAPE SURG IRRIG POUCH 19X23 (DRAPES) ×2 IMPLANT
DRAPE UTILITY XL STRL (DRAPES) ×2 IMPLANT
DRIVER NDLE MEGA SUTCUT DVNCXI (INSTRUMENTS) ×2 IMPLANT
ELECTRODE REM PT RTRN 9FT ADLT (ELECTROSURGICAL) ×2 IMPLANT
FORCEPS BPLR FENES DVNC XI (FORCEP) ×2 IMPLANT
FORCEPS PROGRASP DVNC XI (FORCEP) ×2 IMPLANT
GAUZE 4X4 16PLY ~~LOC~~+RFID DBL (SPONGE) IMPLANT
GLOVE BIOGEL PI IND STRL 7.0 (GLOVE) ×6 IMPLANT
GLOVE BIOGEL PI MICRO STRL 7 (GLOVE) ×6 IMPLANT
GOWN STRL REUS W/ TWL XL LVL3 (GOWN DISPOSABLE) ×6 IMPLANT
HIBICLENS CHG 4% 4OZ BTL (MISCELLANEOUS) ×4 IMPLANT
HOLDER FOLEY CATH W/STRAP (MISCELLANEOUS) ×2 IMPLANT
IRRIGATION SUCT STRKRFLW 2 WTP (MISCELLANEOUS) ×2 IMPLANT
KIT PINK PAD W/HEAD ARM REST (MISCELLANEOUS) ×2 IMPLANT
KIT TURNOVER KIT B (KITS) ×2 IMPLANT
LEGGING LITHOTOMY PAIR STRL (DRAPES) ×2 IMPLANT
MANIFOLD NEPTUNE II (INSTRUMENTS) IMPLANT
NEEDLE INSUFFLATION 14GA 120MM (NEEDLE) IMPLANT
OBTURATOR OPTICALSTD 8 DVNC (TROCAR) ×2 IMPLANT
OCCLUDER COLPOPNEUMO (BALLOONS) IMPLANT
PACK ROBOT WH (CUSTOM PROCEDURE TRAY) ×2 IMPLANT
PAD OB MATERNITY 11 LF (PERSONAL CARE ITEMS) ×2 IMPLANT
PAD POSITIONING PINK XL (MISCELLANEOUS) ×2 IMPLANT
POWDER SURGICEL 3.0 GRAM (HEMOSTASIS) IMPLANT
RUMI II 3.0CM BLUE KOH-EFFICIE (DISPOSABLE) IMPLANT
RUMI II GYRUS 2.5CM BLUE (DISPOSABLE) IMPLANT
RUMI II GYRUS 3.5CM BLUE (DISPOSABLE) IMPLANT
RUMI II GYRUS 4.0CM BLUE (DISPOSABLE) IMPLANT
SCISSORS MNPLR CVD DVNC XI (INSTRUMENTS) ×2 IMPLANT
SEAL UNIV 5-12 XI (MISCELLANEOUS) ×8 IMPLANT
SEALER VESSEL EXT DVNC XI (MISCELLANEOUS) ×2 IMPLANT
SET CYSTO IRRIGATION (SET/KITS/TRAYS/PACK) ×2 IMPLANT
SET TRI-LUMEN FLTR TB AIRSEAL (TUBING) ×2 IMPLANT
SOLN 0.9% NACL POUR BTL 1000ML (IV SOLUTION) ×2 IMPLANT
SPIKE FLUID TRANSFER (MISCELLANEOUS) ×2 IMPLANT
SUT VIC AB 0 CT1 27XBRD ANBCTR (SUTURE) IMPLANT
SUT VIC AB 2-0 CT2 27 (SUTURE) IMPLANT
SUT VIC AB 4-0 PS2 18 (SUTURE) ×4 IMPLANT
SUT VICRYL 0 UR6 27IN ABS (SUTURE) IMPLANT
SUT VLOC 180 0 9IN GS21 (SUTURE) ×2 IMPLANT
SYSTEM BAG RETRIEVAL 10MM (BASKET) IMPLANT
TIP ENDOSCOPIC SURGICEL (TIP) IMPLANT
TIP RUMI ORANGE 6.7MMX12CM (TIP) IMPLANT
TIP UTERINE 5.1X6CM LAV DISP (MISCELLANEOUS) IMPLANT
TIP UTERINE 6.7X10CM GRN DISP (MISCELLANEOUS) IMPLANT
TIP UTERINE 6.7X6CM WHT DISP (MISCELLANEOUS) IMPLANT
TIP UTERINE 6.7X8CM BLUE DISP (MISCELLANEOUS) IMPLANT
TOWEL GREEN STERILE (TOWEL DISPOSABLE) ×2 IMPLANT
TRAY FOLEY W/BAG SLVR 14FR (SET/KITS/TRAYS/PACK) ×2 IMPLANT
TROCAR PORT AIRSEAL 8X120 (TROCAR) ×2 IMPLANT
UNDERPAD 30X36 HEAVY ABSORB (UNDERPADS AND DIAPERS) ×2 IMPLANT

## 2024-04-20 NOTE — Anesthesia Procedure Notes (Signed)
 Procedure Name: Intubation Date/Time: 04/20/2024 7:39 AM  Performed by: Elby Raelene SAUNDERS, CRNAPre-anesthesia Checklist: Patient identified, Emergency Drugs available, Suction available and Patient being monitored Patient Re-evaluated:Patient Re-evaluated prior to induction Oxygen Delivery Method: Circle System Utilized Preoxygenation: Pre-oxygenation with 100% oxygen Induction Type: IV induction Ventilation: Mask ventilation without difficulty Laryngoscope Size: Miller and 2 Grade View: Grade I Tube type: Oral Tube size: 7.0 mm Number of attempts: 1 Airway Equipment and Method: Stylet and Bite block Placement Confirmation: ETT inserted through vocal cords under direct vision, positive ETCO2 and breath sounds checked- equal and bilateral Secured at: 21 cm Tube secured with: Tape Dental Injury: Teeth and Oropharynx as per pre-operative assessment

## 2024-04-20 NOTE — H&P (Signed)
 " OB/GYN Pre-Op History and Physical  Jeanette Gamble is a 43 y.o. [id number] presenting for ra-tlh, bs, cysto. Brought by medical transportation with plan for mom to be caretaker at home. 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/2023   Dysmenorrhea    H/O alcohol abuse    quit 03/2008 (previously drank 1-2 bottles of liquor daily)  down to occasional   History of abnormal cervical Pap smear 2013   History of pre-eclampsia    Pt underwent C-section 2/2 preeclampsia 02/20/02    History of trichomoniasis    Iron deficiency anemia due to chronic blood loss 2023   ED  severe anemia-- Hg 6.5 x2 PRBCs and IV iron   MDD (major depressive disorder)    Microalbuminuria 06/09/2023   Migraine headache    Resistant hypertension    followed by pcp   Uterine fibroid    Vitamin D  deficiency    Wears glasses     Past Surgical History:  Procedure Laterality Date   CESAREAN SECTION  02/20/2002   @[facility]  by Dr WENDI Na   DILATION AND EVACUATION N/A 06/27/2021   Procedure: Suction DILATATION AND EVACUATION with Ultrasound guidance;  Surgeon: Cleatus Moccasin, MD;  Location: [facility] OR;  Service: Gynecology;  Laterality: N/A;   LAPAROSCOPIC BILATERAL SALPINGECTOMY Bilateral 06/27/2021   Procedure: LAPAROSCOPIC BILATERAL SALPINGECTOMY WITH REMOVAL OF ECTOPIC PREGNANCY;  Surgeon: Cleatus Moccasin, MD;  Location: [facility] OR;  Service: Gynecology;  Laterality: Bilateral;   LEFT HEART CATH AND CORONARY ANGIOGRAPHY N/A 01/18/2020   Procedure:  LEFT HEART CATH AND CORONARY ANGIOGRAPHY;  Surgeon: Darron Deatrice LABOR, MD;  Location: [facility] INVASIVE CV LAB;  Service: Cardiovascular;  Laterality: N/A;    OB History  Gravida Para Term Preterm AB Living  2 1  1 1 1   SAB IAB Ectopic Multiple Live Births    1      # Outcome Date GA Lbr Len/2nd Weight Sex Type Anes PTL Lv  2 Preterm 2003 [redacted]w[redacted]d   M CS-LTranv     1 Ectopic             Social History   Socioeconomic History   Marital status: Single    Spouse name: Not on file   Number of children: 1   Years of education: Not on file   Highest education level: Not on file  Occupational History   Not on file  Tobacco Use   Smoking status: Every Day    Types: Cigarettes   Smokeless tobacco: Never   Tobacco comments:    02-10-2024  pt stated smokes 1 ppd ,  decreasing down for upcoming surgery since last month, now at 6 per day;   started age 86 (89)   smoking for 26 yrs  Vaping Use   Vaping status: Some Days   Substances: Nicotine , THC   Devices: differet varities  Substance and Sexual Activity   Alcohol  use: Yes    Comment: occasionally   Drug use: Not Currently    Comment: 02-10-2024  pt stated occasionally eatable marijuana,  last time 02/10/24   Sexual activity: Yes    Birth control/protection: None  Other Topics Concern   Not on file  Social History Narrative   Has a 81 year old son (2011)   Just quit smoking      Family history of DM, Cancer (type?)   Social Drivers of Health   Tobacco Use: High Risk (04/20/2024)   Patient History    Smoking Tobacco Use: Every Day    Smokeless Tobacco Use: Never    Passive Exposure: Not on file  Financial Resource Strain: Not on file  Food Insecurity: Low Risk (04/11/2024)   Received from [facility]   Epic    Within the past 12 months, you worried that your food would run out before you got money to buy more: Never true    Within the past 12 months, the food you bought just didn't last and you didn't have money to get more.  : Never true  Transportation Needs: No Transportation Needs (04/11/2024)   Received from [facility]    In the past 12 months, has lack of reliable transportation kept you from medical appointments, meetings, work or from getting things needed for daily living? : No  Physical Activity: Not on file  Stress: Not on file  Social Connections: Not on file  Depression (PHQ2-9): Medium Risk (07/28/2023)   Depression (PHQ2-9)    PHQ-2 Score: 10  Alcohol Screen: Not on file  Housing: Low Risk (04/11/2024)   Received from [facility]   Epic    What is your living situation today?: I have a steady place to live    Think about the place you live. Do you have problems with any of the following? Choose all that apply:: None/None on this list  Utilities: Low Risk (04/11/2024)   Received from [facility]   Utilities    In the past 12 months has the electric, gas, oil, or water company threatened to shut off services in your home? : No  Health Literacy: Not on file    Family History  Problem Relation Age of Onset   Scoliosis Mother    Hypertension Mother    Arthritis Mother    Hyperlipidemia Mother     Medications Prior to Admission  Medication Sig Dispense Refill Last Dose/Taking   allopurinol  (ZYLOPRIM ) 100 MG tablet TAKE 2 TABLETS (200 MG TOTAL) BY MOUTH DAILY. 180 tablet 2 04/19/2024   amLODipine  (NORVASC ) 10 MG tablet TAKE 1 TABLET (10 MG TOTAL) BY MOUTH DAILY. 90 tablet 2 04/20/2024 at  4:30 AM   Azilsartan-Chlorthalidone 40-25 MG TABS Take 1 tablet by mouth daily.   04/19/2024   carvedilol  (COREG ) 25 MG tablet Take 25 mg by mouth 2 (two) times daily with a meal.   04/20/2024 at  4:30 AM   Cholecalciferol  (VITAMIN D3) 1.25 MG (50000 UT) CAPS Take 1 capsule by mouth once a week. Sunday's   Past Week   Colchicine  0.6 MG CAPS TAKE ONE CAPSULE BY MOUTH ONCE DAILY 30 capsule 2 04/19/2024   DULoxetine  (CYMBALTA ) 60 MG capsule Take 60 mg by mouth daily.   04/20/2024 at   4:30 AM   Ferric Maltol 30 MG CAPS Take 1 capsule by mouth in the morning and at bedtime.   04/19/2024   fluticasone  (FLONASE ) 50 MCG/ACT nasal spray Place 2  sprays into both nostrils daily. 9.9 mL 2 04/19/2024   hydrALAZINE  (APRESOLINE ) 50 MG tablet TAKE 1 TABLET (50 MG TOTAL) BY MOUTH EVERY 8 (EIGHT) HOURS. (Patient taking differently: Take 50 mg by mouth every 8 (eight) hours.) 360 tablet 2 04/20/2024 at  4:30 AM   methocarbamol  (ROBAXIN ) 500 MG tablet Take 500 mg by mouth 3 (three) times daily as needed.   04/19/2024   norethindrone  (AYGESTIN ) 5 MG tablet TAKE 2 TABLETS (10 MG TOTAL) BY MOUTH DAILY. 60 tablet 6 Past Month   pregabalin (LYRICA) 100 MG capsule Take 100 mg by mouth 3 (three) times daily.   04/20/2024 at  4:30 AM   SUMAtriptan  (IMITREX ) 50 MG tablet Take 50 mg by mouth every 2 (two) hours as needed for migraine.   Past Week   omeprazole  (PRILOSEC) 20 MG capsule Take 1 capsule by mouth daily.   Unknown    Allergies[1]  Review of Systems: Negative except for what is mentioned in HPI.     Physical Exam: BP (!) 132/95   Pulse 75   Temp 98.1 F (36.7 C) (Oral)   Resp 17   Ht 5' 8 (1.727 m)   Wt 121.1 kg   LMP 04/04/2024 (Approximate)   SpO2 95%   BMI 40.60 kg/m  CONSTITUTIONAL: Well-developed, well-nourished and in no acute distress.  HENT:  Normocephalic, atraumatic, External right and left ear normal. Oropharynx is clear and moist EYES: Conjunctivae and EOM are normal. Pupils are equal, round, and reactive to light. No scleral icterus.  NECK: Normal range of motion, supple, no masses SKIN: Skin is warm and dry. No rash noted. Not diaphoretic. No erythema. No pallor. NEUROLGIC: Alert and oriented to person, place, and time. Normal reflexes, muscle tone coordination. No cranial nerve deficit noted. PSYCHIATRIC: Normal mood and affect. Normal behavior. Normal judgment and thought content. RESPIRATORY: Normal effort PELVIC: Deferred   Pertinent Labs/Studies:    Results for orders placed or performed during the hospital encounter of 04/20/24 (from the past 72 hours)  Pregnancy, urine POC     Status: None   Collection Time: 04/20/24  5:55 AM  Result Value Ref Range   Preg Test, Ur NEGATIVE NEGATIVE    Comment:        THE SENSITIVITY OF THIS METHODOLOGY IS >20 mIU/mL.        Assessment and Plan :Jeanette Gamble is a 43 y.o. [phone] here for definitive surgical management of AUB.   Patient desires surgical management with RA-TLH, BS, cysto.  The risks of surgery were discussed in detail with the patient including but not limited to: bleeding which may require transfusion or reoperation; infection which may require prolonged hospitalization or re-hospitalization and antibiotic therapy; injury to bowel, bladder, ureters and major vessels or other surrounding organs which may lead to other procedures; formation of adhesions; need for additional procedures including laparotomy or subsequent procedures secondary to intraoperative injury or abnormal pathology; thromboembolic phenomenon; incisional problems and other postoperative or anesthesia complications.  Patient was told that the likelihood that her condition and symptoms will be treated effectively with this surgical management was high; the postoperative expectations were also discussed in detail. The patient also understands the alternative treatment options which were discussed in full.    Sahmya Arai, M.D. Minimally Invasive Gynecologic Surgery and Pelvic Pain Specialist Attending Obstetrician & Gynecologist, [facility] for [facility], [facility]     [1]  Allergies Allergen Reactions   Gabapentin Other (See Comments)  memory loss   Naproxen  Nausea Only    headache   Tramadol  Nausea Only    headache   "

## 2024-04-20 NOTE — Progress Notes (Addendum)
 SUBJECTIVE: feeling ok, very sleepy. Reports did not have this same issue after her last surgery. Has been able to tolerate some po. Has urge to void.   OBJECTIVE:  Today's Vitals   04/20/24 1405 04/20/24 1524 04/20/24 1527 04/20/24 1529  BP:  129/78 128/85 134/89  Pulse:  (!) 59 86 92  Resp:      Temp:      TempSrc:      SpO2:      Weight:      Height:      PainSc: 6       Body mass index is 40.6 kg/m.  Somnolent - falling asleep during conversation but arousable Normal respiratory effort with [place] on  Abdomen soft, incisions clean/dry/intact    ASSESSMENT/PLAN:  Very somnolent, will continue to monitor O2 for comfort, continue to wean DTV: discussed with nurse about using bedpan or bedside commode   Spoke with mother on the phone: mother states she didn't sleep at all the night before and would prefer/recommend she remain overnight. Also will be out of the home at some point later this afternoon and would want to be sure she is home when daughter arrives.   Carter Quarry, MD, FACOG Minimally Invasive Gynecologic Surgery  Obstetrics and Gynecology, [facility] for [facility], [facility] 04/20/2024

## 2024-04-20 NOTE — Op Note (Signed)
 Jeanette Gamble PROCEDURE DATE: 04/20/2024  PREOPERATIVE DIAGNOSIS: abnormal uterine bleeding, fibroids  POSTOPERATIVE DIAGNOSIS: abnormal uterine bleeding, fibroids PROCEDURE:    robotic assisted total laparoscopic hysterectomy, cystoscopy, laparoscopic bilateral TAP block SURGEON: Carter Quarry, MD ASSISTANT:  Rollo Bring, MD    An experienced assistant was required given the standard of surgical care given the complexity of the case.  This assistant was needed for exposure, dissection, suctioning, retraction, instrument exchange, and for overall help during the procedure.  INDICATIONS: 43 y.o. [id number] with AUB-L.  Risks of surgery were discussed with the patient including but not limited to: bleeding which may require transfusion; infection which may require antibiotics; injury to surrounding organs; need for additional procedures including laparotomy;  and other postoperative/anesthesia complications. Written informed consent was obtained.    FINDINGS:  Normal external genitalia, 16 wk size mobile uterus with abnormal contours, small cervix Laparoscopically: normal upper abdominal survey, enlarged uterus, surgically absent fallopian tubes bilaterally with fimbrea on right ovary, bilateral ovaries present with 1cm cyst on left ovary and slightly enlarged appearing right ovary with adhesions to the posterior cul de sac, bilateral ureters seen, normal anterior cul de sac, otherwise normal posterior cul de sac Cystoscopically: normal bladder wall without apparent injury, bilateral ureteral orifices, urine from bilateral ureteral orifices   ANESTHESIA: General, paracervical block INTRAVENOUS FLUIDS:  1300 ml of LR ESTIMATED BLOOD LOSS:  50 ml URINE OUTPUT: 150 ml SPECIMENS: uterus, cervix, right fimbria (~770g) COMPLICATIONS:  None immediate.  The risks, benefits, and alternatives of surgery were explained, understood, and accepted. Consents were signed. All questions were answered. She  was taken to the operating room and general anesthesia was applied without complication. She was placed in the dorsal lithotomy position and her abdomen and vagina were prepped and draped after she had been carefully positioned on the table. A bimanual exam revealed a 16 week size uterus that was mobile. Her adnexa were not enlarged. A Foley catheter was placed and it drained clear throughout the case. A speculum was placed and the cervix visualized. The cervix was measured and the uterus was sounded to 14 cm. A Rumi uterine manipulator using a 12cm tip and 2.5cm cup was placed without difficulty.  Gloves were changed and attention was turned to the abdomen.  An 8mm incision was made in the LUQ and an optiview airseal trocar was introduced into the abdomen. The Entry was confirmed with low opening intraabdominal pressure and visualization and the abdomen was then insufflated. After good pneumoperitoneum was established, the abdomen was surveyed including the upper abdomen. Bilateral laparoscopic TAP block was then completed with 1:1 solution of 0.5% bupivicaine and exparel . She was placed in Trendelenburg position and ports were placed in appropriate positions on her abdomen to allow maximum exposure during the robotic case. Specifically, trocars were placed supraumbilically, in the LLQ, RLQ, and RUQ.  These were all placed under direct laparoscopic visualization. The robot was docked and I proceeded with a robotic portion of the case.  The pelvis was inspected and the uterus was found to have the above findings. The ovary was mobilized carefully from the posterior cul de sac. The round ligament was divided and the leaves of the broad ligament were separated. The utero-ovarian ligament of the right ovary was cauterized and divided. The anterior leaf of the broad was divided down to the [place] ring. The posterior leaf was then divided and the uterine vessels were skeletonized and cauterized. Attention was turned to  the left side where the utero-ovarian  ligament was cauterized and divided followed by the round ligament the leaves of the broad were separated and the anterior leaf dissected down to where the prior contralateral incision stopped. The bladder flap was developed further. The posterior leaf was divided down to the cup. The uterine vessels were then cauterized, divided and lateralized. Attention returned to the contralateral side where the uterine vessels were cauterized, divided and lateralized to the cup edge. The bladder flap was dissected down to the endopelvic fascia. The bladder was pushed out of the operative site and a left sided colpotomy was made. The colpotomy incision was extended circumferentially, following the blue outline of the Rumi manipulator. All pedicles were hemostatic.  The manipulator was removed from the uterus. A 17cm Alexis specimen extraction bag was introduced into the abdomen via the vaginal cuff and the uterus placed into the bag. The vaginal cuff was closed with v-loc suture in a running fashion.  Excellent hemostasis was noted throughout. The pelvis was irrigated. The intraabdominal pressure was lowered assess hemostasis and the robot undocked. At this time, the umbilical skin and fascial incisions were extended inferiorly forming an aperture ~4cm long. The opening of the bag was brought the extended umbilical incision and insufflation stopped. Knife guard was placed within the incision and the specimen was manually morcellated using the ExCite technique. The entirety of the specimen was removed and the bag noted to be intact. The fascia of the extended incision was grasped and re-approximated using 0 vicryl. The abdomen was re-insufflated and the pelvis was investigated. There was slight bleeding noted, so surgicel powder applied to the operative site. After determining excellent hemostasis, instruments were removed from the abdomen.  At this point I performed cystoscopy. The  cystoscopy revealed ejection from both ureters.   Subcutaneous layer of all incisions was closed with 2-0 vicryl with the umbilical area closed in 2 layers. The skin was closed with 4-0 vicryl in a subcuticular, continuous fashion.  The incisions were cleaned and dermabond applied to each incision.  The patient was then extubated and taken to recovery in stable condition.   Sponge, lap and needle counts were correct x 2.   Carter Quarry, MD Minimally Invasive Gynecologic Surgery  Obstetrics and Gynecology, [facility] for [facility], [facility] 04/20/2024

## 2024-04-20 NOTE — Discharge Instructions (Addendum)
 Post Op Hysterectomy Instructions Please read the instructions below. Refer to these instructions for the next few weeks. These instructions provide you with general information on caring for yourself after surgery. Your caregiver may also give you specific instructions. While your treatment has been planned according to the most current medical practices available, unavoidable problems sometimes happen. If you have any problems or questions after you leave, please call your caregiver.  HOME CARE INSTRUCTIONS Healing will take time. You will have discomfort, tenderness, swelling and bruising at the operative site for a couple of weeks. This is normal and will get better as time goes on.  Only take over-the-counter or prescription medicines for pain, discomfort or fever as directed by your caregiver.  Do not take aspirin . It can cause bleeding.  Do not drive when taking pain medication.  Follow your caregivers advice regarding diet, exercise, lifting, driving and general activities.  Resume your usual diet as directed and allowed.  Get plenty of rest and sleep.  Do not douche, use tampons, or have sexual intercourse until your caregiver gives you permission. .  Take your temperature if you feel hot or flushed.  You may shower today when you get home.  No tub bath for one week.   Do not drink alcohol until you are not taking any narcotic pain medications.  Try to have someone home with you for a week or two to help with the household activities.   Be careful over the next two to three weeks with any activities at home that involve lifting, pushing, or pulling.  Listen to your body--if something feels uncomfortable to do, then don't do it. Make sure you and your family understands everything about your operation and recovery.  Walking up stairs is fine. Do not sign any legal documents until you feel normal again.  Keep all your follow-up appointments as recommended by your caregiver.   PLEASE CALL  THE OFFICE IF: There is swelling, redness or increasing pain in the wound area.  Pus is coming from the wound.  You notice a bad smell from the wound or surgical dressing.  You have pain, redness and swelling from the intravenous site.  The wound is breaking open (the edges are not staying together).   You develop pain or bleeding when you urinate.  You develop abnormal vaginal discharge.  You have any type of abnormal reaction or develop an allergy to your medication.  You need stronger pain medication for your pain   SEEK IMMEDIATE MEDICAL CARE: You develop a temperature of 100.5 or higher.  You develop abdominal pain.  You develop chest pain.  You develop shortness of breath.  You pass out.  You develop pain, swelling or redness of your leg.  You develop heavy vaginal bleeding with or without blood clots.   MEDICATIONS: Restart your regular medications BUT wait one week before restarting all vitamins and mineral supplements Take Tylenol  1000mg  every 8 hours for the next several days Use oxycodone  5 mg every 4-6 hours. Taking motrin  and tylenol  should help reduce how often you use oxycodone .  You may use an over the counter stool softener like Colace or Dulcolax to help with starting a bowel movement.  You can also use miralax  (polyethylene glycol). Start the day after you go home.  Warm liquids, fluids, and ambulation help too.  If you have not had a bowel movement in four days, you need to call the office.    Post Anesthesia Home Care Instructions  Activity:  Get plenty of rest for the remainder of the day. A responsible individual must stay with you for 24 hours following the procedure.  For the next 24 hours, DO NOT: -Drive a car -Advertising copywriter -Drink alcoholic beverages -Take any medication unless instructed by your physician -Make any legal decisions or sign important papers.  Meals: Start with liquid foods such as gelatin or soup. Progress to regular foods as  tolerated. Avoid greasy, spicy, heavy foods. If nausea and/or vomiting occur, drink only clear liquids until the nausea and/or vomiting subsides. Call your physician if vomiting continues.  Special Instructions/Symptoms: Your throat may feel dry or sore from the anesthesia or the breathing tube placed in your throat during surgery. If this causes discomfort, gargle with warm salt water. The discomfort should disappear within 24 hours.  You received Oxycodone  5 mg at 12:39 pm.

## 2024-04-20 NOTE — Consult Note (Signed)
 NEUROLOGY CONSULT NOTE   Date of service: April 20, 2024 Patient Name: Jeanette Gamble MRN:  [id number] DOB:  Feb 09, 1981 Chief Complaint: somnolence s/p hysterectomy Requesting Provider: Jeralyn Crutch, MD  History of Present Illness  Jeanette Gamble is a 43 y.o. female with hx of uterine bleeding, gout, chronic pain, hypertension, MDD, who underwent elective robotic assisted total abdominal hysterectomy with bilateral salpingectomy under general anesthesia and post op was noted to be somnolent, despite observing for several hours. Neurology consulted for further evaluation.  On my evaluation, she arouses to loud voice, partially opens her eyes and able to provide some history, thou limited attention secondary to somnolence.  She reports that she has not slept at all in the last few days.  She follows with Dr. Shellia with [facility] for concerns for trouble falling asleep, trouble staying asleep, excessive daytime sleepiness.  She was noted to have an Epworth sleepiness score of 18/24.  She is scheduled to get a home sleep study.    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/2023   Dysmenorrhea    H/O alcohol abuse    quit 03/2008 (previously drank 1-2 bottles of liquor daily)  down to occasional   History of abnormal cervical Pap smear 2013   History of pre-eclampsia    Pt underwent C-section 2/2 preeclampsia 02/20/02    History of trichomoniasis     Iron deficiency anemia due to chronic blood loss 2023   ED  severe anemia-- Hg 6.5 x2 PRBCs and IV iron   MDD (major depressive disorder)    Microalbuminuria 06/09/2023   Migraine headache    Resistant hypertension    followed by pcp   Uterine fibroid    Vitamin D  deficiency    Wears glasses     Past Surgical History:  Procedure Laterality Date   CESAREAN SECTION  02/20/2002   @[facility]  by Dr WENDI Na   DILATION AND EVACUATION N/A 06/27/2021   Procedure: Suction DILATATION AND EVACUATION with Ultrasound guidance;  Surgeon: Cleatus Moccasin, MD;  Location: [facility] OR;  Service: Gynecology;  Laterality: N/A;   LAPAROSCOPIC BILATERAL SALPINGECTOMY Bilateral 06/27/2021   Procedure: LAPAROSCOPIC BILATERAL SALPINGECTOMY WITH REMOVAL OF ECTOPIC PREGNANCY;  Surgeon: Cleatus Moccasin, MD;  Location: [facility] OR;  Service: Gynecology;  Laterality: Bilateral;   LEFT HEART CATH AND CORONARY ANGIOGRAPHY N/A 01/18/2020   Procedure: LEFT HEART CATH AND CORONARY ANGIOGRAPHY;  Surgeon: Darron Deatrice LABOR, MD;  Location: [facility] INVASIVE CV LAB;  Service: Cardiovascular;  Laterality: N/A;    Family History: Family History  Problem Relation Age of Onset   Scoliosis Mother    Hypertension Mother    Arthritis Mother    Hyperlipidemia Mother     Social History  reports that she has been smoking cigarettes. She has never used smokeless tobacco. She reports current alcohol use. She reports that she does  not currently use drugs.  Allergies[1]  Medications  Current Medications[2]  Vitals   Vitals:   04/20/24 1700 04/20/24 1900 04/20/24 1951 04/20/24 2000  BP:   124/79   Pulse:   (!) 57   Resp:      Temp:   97.9 F (36.6 C)   TempSrc:      SpO2: 98% 98% 100% 100%  Weight:      Height:        Body mass index is 40.6 kg/m.   Physical Exam   General: Laying comfortably in bed; in no acute distress.  HENT: Normal oropharynx and mucosa. Normal external appearance of ears and nose.  Neck: Supple, no pain or  tenderness  CV: No JVD. No peripheral edema.  Pulmonary: Symmetric Chest rise. Normal respiratory effort.  Abdomen: Soft to touch, non-tender.  Ext: No cyanosis, edema, or deformity  Skin: No rash. Normal palpation of skin.   Musculoskeletal: Normal digits and nails by inspection. No clubbing.   Neurologic Examination  Mental status/Cognition: Drowsy, wakes up to voice, oriented to self, place, month and year.  Poor attention, goes right back to sleep if I stop talking to her.  She is able to do calculations, reasonably well and is able to provide history and has good insight into what is going on. Speech/language: Fluent, comprehension intact, object naming intact, repetition intact.  Cranial nerves:   CN II Pupils equal and reactive to light, no VF deficits    CN III,IV,VI EOM intact, no gaze preference or deviation, no nystagmus    CN V normal sensation in V1, V2, and V3 segments bilaterally    CN VII no asymmetry, no nasolabial fold flattening    CN VIII normal hearing to speech    CN IX & X normal palatal elevation, no uvular deviation    CN XI 5/5 head turn and 5/5 shoulder shrug bilaterally    CN XII midline tongue protrusion    Motor:  Muscle bulk: Normal, tone normal, pronator drift none, tremor none Mvmt Root Nerve  Muscle Right Left Comments  SA C5/6 Ax Deltoid 5 5   EF C5/6 Mc Biceps 5 5   EE C6/7/8 Rad Triceps 5 5   WF C6/7 Med FCR     WE C7/8 PIN ECU     F Ab C8/T1 U ADM/FDI 5 5   HF L1/2/3 Fem Illopsoas 5 5   KE L2/3/4 Fem Quad 5 5   DF L4/5 D Peron Tib Ant 5 5   PF S1/2 Tibial Grc/Sol 5 5    Sensation:  Light touch Intact throughout   Pin prick    Temperature    Vibration   Proprioception    Coordination/Complex Motor:  - Finger to Nose intact bilaterally - Heel to shin intact bilaterally - Rapid alternating movement are normal - Gait: Deferred for patient safety. Labs/Imaging/Neurodiagnostic studies   CBC:  Recent Labs  Lab 04/24/24 1330  04/20/24 2006  WBC 7.5 12.4*  HGB 13.4 13.1  HCT 39.5 40.2  MCV 90.0 94.1  PLT 209 146*   Basic Metabolic Panel:  Lab Results  Component Value Date   NA 133 (L) 04/20/2024   K 4.1 04/20/2024   CO2 18 (L) 04/20/2024   GLUCOSE 116 (H) 04/20/2024   BUN 27 (H) 04/20/2024   CREATININE 1.51 (H) 04/20/2024   CALCIUM  7.9 (L) 04/20/2024   GFRNONAA 43 (L) 04/20/2024   GFRAA >60 01/20/2020   Lipid Panel:  Lab Results  Component Value Date   LDLCALC 126 (H) 02/18/2024   HgbA1c:  Lab Results  Component Value Date   HGBA1C 5.4 01/18/2020   Urine Drug Screen:     Component Value Date/Time   LABOPIA NEG 04/02/2010 2023   COCAINSCRNUR NEG 04/02/2010 2023   LABBENZ NEG 04/02/2010 2023   AMPHETMU NEG 04/02/2010 2023    Alcohol Level No results found for: [facility] INR  Lab Results  Component Value Date   INR 1.0 01/17/2020   APTT  Lab Results  Component Value Date   APTT 25 06/21/2009   AED levels: No results found for: PHENYTOIN, ZONISAMIDE, LAMOTRIGINE, LEVETIRACETA  No prior neuroimaging to review.  ASSESSMENT   Jeanette Gamble is a 43 y.o. female ith hx of uterine bleeding, gout, chronic pain, hypertension, MDD, who underwent elective robotic assisted total abdominal hysterectomy with bilateral salpingectomy under general anesthesia and post op was noted to be somnolent, despite observing for several hours. Neurology consulted for further evaluation.  Her neuroexam demonstrates intact cognition/executive function, no focal deficits or weakness noted.  She is somnolent but also has poor sleep quality at baseline and undergoing outpatient evaluation for concern for sleep apnea.  Overall, I think her hypersomnolence is likely secondary to poor sleep over the last few days and I am not particularly worried or concerned given intact neuro exam. She is not confused.  RECOMMENDATIONS  - no further recommendations at this time. Let her rest overnight and minimize  interruptions. Follow up with her outpatient sleep doctor and agree with home sleep study outpatient. - Vit B12, folate, TSH levels are within normal limits. Thiamine is pending. Will start her on thiamine 100mg  PO daily and this can be discontinue if outaptient thiamine levels are normal. - we will signoff. ______________________________________________________________________    Signed, Marcea Rojek, MD [facility]     [1]  Allergies Allergen Reactions   Gabapentin Other (See Comments)    memory loss   Naproxen  Nausea Only    headache   Tramadol  Nausea Only    headache  [2]  Current Facility-Administered Medications:    0.9 %  sodium chloride  infusion, , Intravenous, Continuous, Stoltzfus, Cordella SQUIBB, DO, Last Rate: 40 mL/hr at 04/20/24 0702, New Bag at 04/20/24 1025   acetaminophen  (TYLENOL ) tablet 1,000 mg, 1,000 mg, Oral, Q6H, Ajewole, Christana, MD, 1,000 mg at 04/20/24 2124   [START ON 04/21/2024] amLODipine  (NORVASC ) tablet 10 mg, 10 mg, Oral, Daily, Ajewole, Christana, MD   [START ON 04/21/2024] carvedilol  (COREG ) tablet 25 mg, 25 mg, Oral, BID WC, Ajewole, Christana, MD   [START ON 04/21/2024] DULoxetine  (CYMBALTA ) DR capsule 60 mg, 60 mg, Oral, Daily, Ajewole, Christana, MD   hydrALAZINE  (APRESOLINE ) tablet 50 mg, 50 mg, Oral, Q8H, Ajewole, Christana, MD, 50 mg at 04/20/24 2123   HYDROmorphone  (DILAUDID ) injection 0.2-0.4 mg, 0.2-0.4 mg, Intravenous, Q2H PRN, Ajewole, Christana, MD   lactated ringers  infusion, , Intravenous, Continuous, Ajewole, Christana, MD, Last Rate: 125 mL/hr at 04/20/24 1405, New Bag at 04/20/24 1405   ondansetron  (ZOFRAN ) tablet 4 mg, 4 mg, Oral, Q6H PRN **OR** ondansetron  (ZOFRAN ) injection 4 mg, 4 mg, Intravenous, Q6H PRN, Ajewole, Christana, MD   oxyCODONE  (Oxy IR/ROXICODONE ) immediate release tablet 5-10 mg, 5-10 mg, Oral, Q4H PRN, Ajewole, Christana, MD   pantoprazole  (PROTONIX ) EC tablet 40 mg, 40 mg, Oral, Daily, Ajewole, Christana,  MD, 40 mg at 04/20/24 2124   polyethylene glycol (MIRALAX  / GLYCOLAX ) packet 17 g, 17 g, Oral, Daily PRN, Ajewole, Christana, MD  simethicone  (MYLICON) chewable tablet 80 mg, 80 mg, Oral, QID PRN, Ajewole, Christana, MD

## 2024-04-20 NOTE — Anesthesia Postprocedure Evaluation (Signed)
"   Anesthesia Post Note  Patient: Jeanette Gamble  Procedure(s) Performed: HYSTERECTOMY, TOTAL, LAPAROSCOPIC, ROBOT-ASSISTED , MINI LAPAROTMY Morcelation (Pelvis) CYSTOSCOPY (Bladder)     Patient location during evaluation: PACU Anesthesia Type: General Level of consciousness: awake and alert Pain management: pain level controlled Vital Signs Assessment: post-procedure vital signs reviewed and stable Respiratory status: spontaneous breathing, nonlabored ventilation, respiratory function stable and patient connected to nasal cannula oxygen Cardiovascular status: blood pressure returned to baseline and stable Postop Assessment: no apparent nausea or vomiting Anesthetic complications: no   No notable events documented.  Last Vitals:  Vitals:   04/20/24 1230 04/20/24 1233  BP: 118/82   Pulse: 89 77  Resp: 15 19  Temp:    SpO2: 95% 93%    Last Pain:  Vitals:   04/20/24 1228  TempSrc:   PainSc: 7                  Albirtha Grinage      "

## 2024-04-20 NOTE — Brief Op Note (Signed)
 04/20/2024  10:52 AM  PATIENT:  Jeanette Gamble  43 y.o. female  PRE-OPERATIVE DIAGNOSIS:  Fibroids Dysmenorrhea  POST-OPERATIVE DIAGNOSIS:  FibroidsDysmenorrhea  PROCEDURE:  Procedures: HYSTERECTOMY, TOTAL, LAPAROSCOPIC, ROBOT-ASSISTED , MINI LAPAROTMY Morcelation (N/A) CYSTOSCOPY (N/A)  SURGEON:  Surgeons and Role:    DEWAINE Jeralyn Crutch, MD - Primary    * Abigail Rollo DASEN, MD - Assisting  PHYSICIAN ASSISTANT: n/a  ASSISTANTS: above   ANESTHESIA:   general, paracervical block, and laparoscopic TAP block  EBL:  150 mL   BLOOD ADMINISTERED:none  DRAINS: none   LOCAL MEDICATIONS USED:  BUPIVICAINE  and OTHER Exparel   SPECIMEN:  Source of Specimen:  uterus  DISPOSITION OF SPECIMEN:  PATHOLOGY  COUNTS:  YES  TOURNIQUET:  * No tourniquets in log *  DICTATION: .Note written in EPIC  PLAN OF CARE: extended recovery  PATIENT DISPOSITION:  PACU - hemodynamically stable.   Delay start of Pharmacological VTE agent (>24hrs) due to surgical blood loss or risk of bleeding: not applicable

## 2024-04-20 NOTE — Progress Notes (Signed)
 Called patient's mother, Adrien to inform her that Jeanette Gamble has gone to room 1- south room 1 and she may go home today when she wakes up enough, no longer needs oxygen and is continuing to do well. She will call patient's room to speak with her and plan for when she arrives home. Message left on cellphone listed for son, Olene, stating patient's room assignment and that he may visit her there.

## 2024-04-20 NOTE — Transfer of Care (Signed)
 Immediate Anesthesia Transfer of Care Note  Patient: Jeanette Gamble  Procedure(s) Performed: HYSTERECTOMY, TOTAL, LAPAROSCOPIC, ROBOT-ASSISTED , MINI LAPAROTMY Morcelation (Pelvis) CYSTOSCOPY (Bladder)  Patient Location: PACU  Anesthesia Type:General  Level of Consciousness: drowsy  Airway & Oxygen Therapy: Patient Spontanous Breathing and Patient connected to face mask oxygen  Post-op Assessment: Report given to RN and Post -op Vital signs reviewed and stable  Post vital signs: Reviewed and stable  Last Vitals:  Vitals Value Taken Time  BP    Temp    Pulse    Resp    SpO2      Last Pain:  Vitals:   04/20/24 0638  TempSrc: Oral  PainSc: 7       Patients Stated Pain Goal: 7 (04/20/24 9361)  Complications: No notable events documented.

## 2024-04-21 ENCOUNTER — Encounter (HOSPITAL_COMMUNITY): Payer: Self-pay | Admitting: Obstetrics and Gynecology

## 2024-04-21 DIAGNOSIS — D259 Leiomyoma of uterus, unspecified: Secondary | ICD-10-CM | POA: Diagnosis not present

## 2024-04-21 MED ORDER — CLONIDINE HCL 0.1 MG PO TABS
0.1000 mg | ORAL_TABLET | Freq: Every day | ORAL | Status: DC
  Administered 2024-04-21: 0.1 mg via ORAL
  Filled 2024-04-21: qty 1

## 2024-04-21 MED ORDER — PREGABALIN 100 MG PO CAPS
100.0000 mg | ORAL_CAPSULE | Freq: Three times a day (TID) | ORAL | Status: DC
  Administered 2024-04-21 (×3): 100 mg via ORAL
  Filled 2024-04-21 (×3): qty 1

## 2024-04-21 MED ORDER — CLONIDINE HCL 0.1 MG PO TABS
0.1000 mg | ORAL_TABLET | Freq: Every day | ORAL | 0 refills | Status: DC
  Filled 2024-04-21: qty 30, 30d supply, fill #0

## 2024-04-21 MED ORDER — POLYETHYLENE GLYCOL 3350 17 G PO PACK: 17.0000 g | PACK | Freq: Every day | ORAL | 0 refills | Status: AC | PRN

## 2024-04-21 MED ORDER — CLONIDINE HCL 0.1 MG PO TABS: 0.1000 mg | ORAL_TABLET | Freq: Every day | ORAL | 0 refills | Status: AC

## 2024-04-21 MED ORDER — POLYETHYLENE GLYCOL 3350 17 G PO PACK
17.0000 g | PACK | Freq: Every day | ORAL | 0 refills | Status: DC | PRN
  Filled 2024-04-21: qty 14, 14d supply, fill #0

## 2024-04-21 NOTE — Plan of Care (Signed)
" °  Problem: Education: Goal: Knowledge of General Education information will improve Description: Including pain rating scale, medication(s)/side effects and non-pharmacologic comfort measures Outcome: Progressing   Problem: Health Behavior/Discharge Planning: Goal: Ability to manage health-related needs will improve Outcome: Progressing   Problem: Clinical Measurements: Goal: Ability to maintain clinical measurements within normal limits will improve Outcome: Progressing Goal: Will remain free from infection Outcome: Progressing Goal: Diagnostic test results will improve Outcome: Progressing Goal: Respiratory complications will improve Outcome: Progressing Goal: Cardiovascular complication will be avoided Outcome: Progressing   Problem: Activity: Goal: Risk for activity intolerance will decrease Outcome: Progressing   Problem: Nutrition: Goal: Adequate nutrition will be maintained Outcome: Progressing   Problem: Coping: Goal: Level of anxiety will decrease Outcome: Progressing   Problem: Elimination: Goal: Will not experience complications related to bowel motility Outcome: Progressing Goal: Will not experience complications related to urinary retention Outcome: Progressing   Problem: Pain Managment: Goal: General experience of comfort will improve and/or be controlled Outcome: Progressing   Problem: Safety: Goal: Ability to remain free from injury will improve Outcome: Progressing   Problem: Skin Integrity: Goal: Risk for impaired skin integrity will decrease Outcome: Progressing   Problem: Education: Goal: Knowledge of the prescribed therapeutic regimen will improve Outcome: Progressing   Problem: Bowel/Gastric: Goal: Gastrointestinal status for postoperative course will improve Outcome: Progressing   Problem: Cardiac: Goal: Ability to maintain an adequate cardiac output Outcome: Progressing Goal: Will show no evidence of cardiac arrhythmias Outcome:  Progressing   Problem: Nutritional: Goal: Will attain and maintain optimal nutritional status Outcome: Progressing   Problem: Neurological: Goal: Will regain or maintain usual level of consciousness Outcome: Progressing   Problem: Clinical Measurements: Goal: Ability to maintain clinical measurements within normal limits Outcome: Progressing Goal: Postoperative complications will be avoided or minimized Outcome: Progressing   Problem: Respiratory: Goal: Will regain and/or maintain adequate ventilation Outcome: Progressing Goal: Respiratory status will improve Outcome: Progressing   Problem: Skin Integrity: Goal: Demonstrates signs of wound healing without infection Outcome: Progressing   Problem: Urinary Elimination: Goal: Will remain free from infection Outcome: Progressing Goal: Ability to achieve and maintain adequate urine output Outcome: Progressing   Problem: Education: Goal: Knowledge of the prescribed therapeutic regimen will improve Outcome: Progressing Goal: Understanding of sexual limitations or changes related to disease process or condition will improve Outcome: Progressing Goal: Individualized Educational Video(s) Outcome: Progressing   Problem: Self-Concept: Goal: Communication of feelings regarding changes in body function or appearance will improve Outcome: Progressing   Problem: Skin Integrity: Goal: Demonstration of wound healing without infection will improve Outcome: Progressing   "

## 2024-04-21 NOTE — Discharge Summary (Signed)
 Gynecology Physician Postoperative Discharge Summary  Patient ID: Jeanette Gamble MRN: [id number] DOB/AGE: [birth year] 44 y.o.  Admit Date: 04/20/2024 Discharge Date: 04/21/2024  Preoperative Diagnoses: abnormal uterine bleeding  Procedures: Procedures (LRB): HYSTERECTOMY, TOTAL, LAPAROSCOPIC, ROBOT-ASSISTED , MINI LAPAROTMY Morcelation (N/A) CYSTOSCOPY (N/A)  Hospital Course:  Jeanette Gamble is a 44 y.o. [id number]  admitted for scheduled surgery.  She underwent the procedures as mentioned above, her operation was uncomplicated. For further details about surgery, please refer to the operative report. Patient was initially somnolent in the postoperative course. She was evaluated by anesthesia an neurology and found to have normal labs/evaluation outside of the somnolence, which improved with observation. By time of discharge on POD#1, her pain was well controlled; she was ambulating, voiding without difficulty, and tolerating a regular diet. She was deemed stable for discharge to home.   Significant Labs:    Latest Ref Rng & Units 04/20/2024    8:06 PM 04/18/2024    1:30 PM 06/08/2022    5:40 PM  CBC  WBC 4.0 - 10.5 K/uL 12.4  7.5  6.3   Hemoglobin 12.0 - 15.0 g/dL 86.8  86.5  9.2   Hematocrit 36.0 - 46.0 % 40.2  39.5  31.4   Platelets 150 - 400 K/uL 146  209  240     Discharge Exam: Blood pressure 120/80, pulse (!) 59, temperature 98.3 F (36.8 C), temperature source Oral, resp. rate 15, height 5' 8 (1.727 m), weight 121.1 kg, last menstrual period 04/04/2024, SpO2 94%. General appearance: alert and no distress  Resp: normal respiratory effort Cardio: regular rate and rhythm  GI: soft, appropriately tender; bowel sounds normal; no masses, no organomegaly.  Incision: C/D/I, no erythema, no drainage noted Extremities: extremities normal, atraumatic, no cyanosis or edema and Homans sign is negative, no sign of DVT  Discharged Condition: Stable  Disposition:    Allergies as of  04/21/2024       Reactions   Gabapentin Other (See Comments)   memory loss   Naproxen  Nausea Only   headache   Tramadol  Nausea Only   headache        Medication List     STOP taking these medications    norethindrone  5 MG tablet Commonly known as: AYGESTIN        TAKE these medications    allopurinol  100 MG tablet Commonly known as: ZYLOPRIM  TAKE 2 TABLETS (200 MG TOTAL) BY MOUTH DAILY.   amLODipine  10 MG tablet Commonly known as: NORVASC  TAKE 1 TABLET (10 MG TOTAL) BY MOUTH DAILY.   Azilsartan-Chlorthalidone 40-25 MG Tabs Take 1 tablet by mouth daily.   carvedilol  25 MG tablet Commonly known as: COREG  Take 25 mg by mouth 2 (two) times daily with a meal.   cloNIDine  0.1 MG tablet Commonly known as: CATAPRES  Take 1 tablet (0.1 mg total) by mouth daily. Start taking on: April 22, 2024   Colchicine  0.6 MG Caps TAKE ONE CAPSULE BY MOUTH ONCE DAILY   DULoxetine  60 MG capsule Commonly known as: CYMBALTA  Take 60 mg by mouth daily.   Ferric Maltol 30 MG Caps Take 1 capsule by mouth in the morning and at bedtime.   fluticasone  50 MCG/ACT nasal spray Commonly known as: FLONASE  Place 2 sprays into both nostrils daily.   hydrALAZINE  50 MG tablet Commonly known as: APRESOLINE  TAKE 1 TABLET (50 MG TOTAL) BY MOUTH EVERY 8 (EIGHT) HOURS. What changed: See the new instructions.   methocarbamol  500 MG tablet Commonly known as: ROBAXIN  Take 500  mg by mouth 3 (three) times daily as needed.   omeprazole  20 MG capsule Commonly known as: PRILOSEC Take 1 capsule by mouth daily.   polyethylene glycol 17 g packet Commonly known as: MIRALAX  / GLYCOLAX  Take 17 g by mouth daily as needed for mild constipation.   pregabalin 100 MG capsule Commonly known as: LYRICA Take 100 mg by mouth 3 (three) times daily.   SUMAtriptan  50 MG tablet Commonly known as: IMITREX  Take 50 mg by mouth every 2 (two) hours as needed for migraine.   Vitamin D3 1.25 MG (50000 UT)  Caps Take 1 capsule by mouth once a week. "Sunday's               Discharge Care Instructions  (From admission, onward)           Start     Ordered   04/21/24 0000  No dressing needed        01" /01/26 1417           Future Appointments  Date Time Provider Department Center  05/11/2024  9:15 AM Jeralyn Crutch, MD [facility]  05/18/2024  8:00 AM Bernie Lamar PARAS, MD [facility] None  06/17/2024  9:15 AM Jeralyn Crutch, MD [facility]     Total discharge time: 15 minutes   Signed:  Crutch Jeralyn, MD Minimally Invasive Gynecologic Surgery and Chronic Pelvic Pain Specialist Obstetrics and Gynecology, [facility] for [facility], [facility] 04/21/2024

## 2024-04-21 NOTE — Progress Notes (Signed)
 Gynecology Progress Note  Admission Date: 04/20/2024 Current Date: 04/21/2024 8:13 AM  Jeanette Gamble is a 44 y.o. [id number] HD#2/POD#1 admitted for postop obs   History complicated by: Patient Active Problem List   Diagnosis Date Noted   Abnormal uterine bleeding (AUB) 04/20/2024   Diffuse pain 05/22/2021   Cervical disc disease with myelopathy 11/25/2019   Uterine fibroid 05/29/2019   Iron deficiency anemia 05/29/2019   Tobacco use disorder 03/03/2019   Dysmenorrhea 03/03/2019   Right ovarian cyst 03/03/2019   Stage 3a chronic kidney disease (HCC) 03/03/2019   Menstrual migraine without status migrainosus, not intractable 03/03/2019   Hyperlipidemia 03/03/2019   Healthcare maintenance 12/10/2015   Vitamin D  deficiency 12/04/2015   Atherosclerosis of aortic arch (HCC) 12/04/2015   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 Has some pain just below and around umbilicus but well controlled with medication. Tolerating po. Has been able to void without issue but needs assist to bathroom. Denies chest pain and SOB. Overall progressing well.   Objective:   Vitals:   04/20/24 2336 04/21/24 0529 04/21/24 0530 04/21/24 0807  BP:  135/81    Pulse:  (!) 57    Resp:  17  15  Temp:  98.7 F (37.1 C)  98.3 F (36.8 C)  TempSrc:  Oral  Oral  SpO2: 98%  98% 94%  Weight:      Height:        Temp:   [97.7 F (36.5 C)-98.7 F (37.1 C)] 98.3 F (36.8 C) (01/01 0807) Pulse Rate:  [57-92] 57 (01/01 0529) Resp:  [13-37] 15 (01/01 0807) BP: (102-136)/(63-100) 135/81 (01/01 0529) SpO2:  [91 %-100 %] 94 % (01/01 0807) I/O last 3 completed shifts: In: 3423.8 [P.O.:220; I.V.:2953.8; IV Piggyback:250] Out: 1675 [Urine:1525; Blood:150] No intake/output data recorded.  Intake/Output Summary (Last 24 hours) at 04/21/2024 0813 Last data filed at 04/21/2024 0600 Gross per 24 hour  Intake 3173.8 ml  Output 1675 ml  Net 1498.8 ml     Current Vital Signs 24h Vital Sign Ranges  T 98.3 F (36.8 C) Temp  Avg: 98.1 F (36.7 C)  Min: 97.7 F (36.5 C)  Max: 98.7 F (37.1 C)  BP 135/81 BP  Min: 102/76  Max: 136/100  HR (!) 57 Pulse  Avg: 71.8  Min: 57  Max: 92  RR 15 Resp  Avg: 18.4  Min: 13  Max: 37  SaO2 94 % Room Air SpO2  Avg: 95.1 %  Min: 91 %  Max: 100 %       24 Hour I/O Current Shift I/O  Time Ins Outs 12/31 0701 - 01/01 0700 In: 3423.8 [P.O.:220; I.V.:2953.8] Out: 1675 [Urine:1525] No intake/output data recorded.   Patient Vitals for the past 12 hrs:  BP Temp Temp src Pulse Resp SpO2  04/21/24 0807 -- 98.3 F (36.8 C) Oral -- 15 94 %  04/21/24 0530 -- -- -- -- -- 98 %  04/21/24 0529 135/81 98.7  F (37.1 C) Oral (!) 57 17 --  04/20/24 2336 -- -- -- -- -- 98 %  04/20/24 2333 120/79 -- -- 63 16 94 %     Patient Vitals for the past 24 hrs:  BP Temp Temp src Pulse Resp SpO2  04/21/24 0807 -- 98.3 F (36.8 C) Oral -- 15 94 %  04/21/24 0530 -- -- -- -- -- 98 %  04/21/24 0529 135/81 98.7 F (37.1 C) Oral (!) 57 17 --  04/20/24 2336 -- -- -- -- -- 98 %  04/20/24 2333 120/79 -- -- 63 16 94 %  04/20/24 2000 -- -- -- -- -- 100 %  04/20/24 1951 124/79 97.9 F (36.6 C) -- (!) 57 -- 100 %  04/20/24 1900 -- -- -- -- -- 98 %  04/20/24 1700 -- -- -- -- -- 98 %  04/20/24 1529 134/89 -- -- 92 -- --  04/20/24 1527 128/85 -- -- 86 -- --  04/20/24 1524 129/78 -- -- (!) 59 -- --  04/20/24  1337 124/75 97.7 F (36.5 C) Oral (!) 58 15 94 %  04/20/24 1300 102/76 -- -- 65 13 92 %  04/20/24 1245 116/85 -- -- 65 (!) 28 93 %  04/20/24 1240 -- -- -- 71 18 94 %  04/20/24 1233 -- -- -- 77 19 93 %  04/20/24 1230 118/82 -- -- 89 15 95 %  04/20/24 1228 -- -- -- 84 (!) 24 92 %  04/20/24 1227 -- -- -- 66 16 92 %  04/20/24 1218 -- -- -- 74 18 93 %  04/20/24 1215 112/79 -- -- 64 17 94 %  04/20/24 1213 -- -- -- 79 16 91 %  04/20/24 1210 -- -- -- 86 (!) 21 91 %  04/20/24 1204 -- 97.7 F (36.5 C) -- 85 18 94 %  04/20/24 1200 109/63 -- -- 66 15 96 %  04/20/24 1151 -- -- -- 65 15 97 %  04/20/24 1145 108/77 -- -- 65 15 97 %  04/20/24 1140 -- -- -- 66 16 97 %  04/20/24 1130 113/84 -- -- 67 16 96 %  04/20/24 1119 -- -- -- 85 18 97 %  04/20/24 1115 133/84 -- -- 70 (!) 37 96 %  04/20/24 1109 (!) 136/100 98.3 F (36.8 C) -- 78 (!) 24 94 %    Physical exam: General appearance: alert, cooperative, and no distress, easily aroused and maintained alertness throughout conversation  Abdomen: soft, appropriately tender GU: No gross VB Lungs: normal respiratory effort on room air Extremities: no calf tenderness bilaterally, moving all extremities without difficulty  Skin: intact, laparoscopic incisions x5 well approximated with skin glue, clean and dry Psych: appropriate Neurologic: Grossly normal  Medications Current Facility-Administered Medications  Medication Dose Route Frequency Provider Last Rate Last Admin   acetaminophen  (TYLENOL ) tablet 1,000 mg  1,000 mg Oral Q6H Jamela Cumbo, MD   1,000 mg at 04/21/24 0234   amLODipine  (NORVASC ) tablet 10 mg  10 mg Oral Daily Edwen Mclester, MD       carvedilol  (COREG ) tablet 25 mg  25 mg Oral BID WC Gloriajean Okun, MD       cloNIDine  (CATAPRES ) tablet 0.1 mg  0.1 mg Oral Daily Fayette Hamada, MD   0.1 mg at 04/21/24 0536   DULoxetine  (CYMBALTA ) DR capsule 60 mg  60 mg Oral Daily Hania Cerone, MD       hydrALAZINE  (APRESOLINE )  tablet 50 mg  50 mg Oral Q8H Viva Gallaher, MD  50 mg at 04/21/24 9482   HYDROmorphone  (DILAUDID ) injection 0.2-0.4 mg  0.2-0.4 mg Intravenous Q2H PRN Coden Franchi, MD       lactated ringers  infusion   Intravenous Continuous Tija Biss, MD 125 mL/hr at 04/20/24 2322 New Bag at 04/20/24 2322   ondansetron  (ZOFRAN ) tablet 4 mg  4 mg Oral Q6H PRN Azaya Goedde, MD       Or   ondansetron  (ZOFRAN ) injection 4 mg  4 mg Intravenous Q6H PRN Aubra Pappalardo, MD       oxyCODONE  (Oxy IR/ROXICODONE ) immediate release tablet 5-10 mg  5-10 mg Oral Q4H PRN Darian Cansler, MD       pantoprazole  (PROTONIX ) EC tablet 40 mg  40 mg Oral Daily Majid Mccravy, MD   40 mg at 04/20/24 2124   polyethylene glycol (MIRALAX  / GLYCOLAX ) packet 17 g  17 g Oral Daily PRN Dyneisha Murchison, MD       pregabalin (LYRICA) capsule 100 mg  100 mg Oral TID Serayah Yazdani, MD   100 mg at 04/21/24 0517   simethicone  (MYLICON) chewable tablet 80 mg  80 mg Oral QID PRN Jeralyn Crutch, MD          Labs  Recent Labs  Lab 04/18/24 1330 04/20/24 2006  WBC 7.5 12.4*  HGB 13.4 13.1  HCT 39.5 40.2  PLT 209 146*    Recent Labs  Lab 04/18/24 1330 04/20/24 2006  NA 137 133*  K 3.6 4.1  CL 101 102  CO2 25 18*  BUN 22* 27*  CREATININE 1.45* 1.51*  CALCIUM  9.0 7.9*  GLUCOSE 95 116*     Assessment & Plan:  Meeting most postop goals *GYN: follow up surgical pathology *Neuro: grossly intact and improved alertness this morning. Will check with nursing colleague regarding ability to ambulate/void without assistance *Pain: well controlled with addition of home lyrica and prn clonidine  *FEN/GI: tolerating regular diet, d/c mIVF *PPx: SCDs and IS *Dispo: discharge later today   Code Status: Full Code  Crutch Jeralyn, MD Minimally Invasive Gynecologic Surgery Center for [facility] (Faculty Practice) 04/21/2024 8:13 AM pager [phone] cell phone [phone]

## 2024-04-22 ENCOUNTER — Other Ambulatory Visit (HOSPITAL_COMMUNITY): Payer: Self-pay

## 2024-04-23 LAB — SURGICAL PATHOLOGY

## 2024-04-25 ENCOUNTER — Ambulatory Visit: Payer: Self-pay | Admitting: Obstetrics and Gynecology

## 2024-04-25 LAB — VITAMIN B1: Vitamin B1 (Thiamine): 155.8 nmol/L (ref 66.5–200.0)

## 2024-04-25 MED FILL — Cefazolin Sodium for IV Soln 1 GM and Dextrose 4% (50 ML): INTRAVENOUS | Qty: 50 | Status: AC

## 2024-05-11 ENCOUNTER — Telehealth: Payer: Self-pay | Admitting: Obstetrics and Gynecology

## 2024-05-11 DIAGNOSIS — D259 Leiomyoma of uterus, unspecified: Secondary | ICD-10-CM

## 2024-05-11 DIAGNOSIS — N939 Abnormal uterine and vaginal bleeding, unspecified: Secondary | ICD-10-CM

## 2024-05-11 DIAGNOSIS — Z09 Encounter for follow-up examination after completed treatment for conditions other than malignant neoplasm: Secondary | ICD-10-CM

## 2024-05-11 NOTE — Progress Notes (Unsigned)
 "   GYNECOLOGY VIRTUAL VISIT ENCOUNTER NOTE  Provider location: Center for [facility] at [facility]   Patient location: Home  I connected with Jeanette Gamble on 05/11/24 at 11:15 AM EST by MyChart Video Encounter and verified that I am speaking with the correct person using two identifiers.   I discussed the limitations, risks, security and privacy concerns of performing an evaluation and management service virtually and the availability of in person appointments. I also discussed with the patient that there may be a patient responsible charge related to this service. The patient expressed understanding and agreed to proceed.   History:  Jeanette Gamble is a 44 y.o. [id number] female being evaluated today for postop. .some pain -0 what restrictions. Overall good Incision are healing well, scabs hav fallen off. Abdomen is a little warm but not red. Thinks she has been laying on that side. No vaginal bleeding, spotting has resolved. Voiding and BM without issue. No nausea and tolearting po. Will have some nausea with hot flashes. Has a few oxy left. Having pain with eating, stabbing pain no matter what she eats. Stabbing pain in the naval. Will stick around for a while and then go away. No lump or anything. The pain while eating and then linger a bit when done. A little pain when she has to have  BM. This pain has been going on since eating more regularly since getting home. Trying to avoid greasy foods.     Past Medical History:  Diagnosis Date   Abnormal uterine bleeding (AUB)    Bradycardia    Chronic gout    Chronic pain    bilateral wrist pain/  knee pain/ back   Chronic radicular lumbar pain    CKD (chronic kidney disease), stage III (HCC)    DDD (degenerative disc disease), cervical    DOE (dyspnea on exertion)    02-10-2024   pt stated SOB w/ stairs, long distance walks,  able to do some household chores but due to chronic knee pain limited   Dyshidrotic hand  dermatitis 04/20/2023   Dysmenorrhea    H/O alcohol abuse    quit 03/2008 (previously drank 1-2 bottles of liquor daily)  down to occasional   History of abnormal cervical Pap smear 2013   History of pre-eclampsia    Pt underwent C-section 2/2 preeclampsia 02/20/02    History of trichomoniasis    Iron deficiency anemia due to chronic blood loss 2023   ED  severe anemia-- Hg 6.5 x2 PRBCs and IV iron   MDD (major depressive disorder)    Microalbuminuria 06/09/2023   Migraine headache    Resistant hypertension    followed by pcp   Uterine fibroid    Vitamin D  deficiency    Wears glasses    Past Surgical History:  Procedure Laterality Date   CESAREAN SECTION  02/20/2002   @[facility]  by Dr WENDI Na   CYSTOSCOPY N/A 04/20/2024   Procedure: CYSTOSCOPY;  Surgeon: Jeralyn Crutch, MD;  Location: [facility] OR;  Service: Gynecology;  Laterality: N/A;   DILATION AND EVACUATION N/A 06/27/2021   Procedure: Suction DILATATION AND EVACUATION with Ultrasound guidance;  Surgeon: Cleatus Moccasin, MD;  Location: [facility] OR;  Service: Gynecology;  Laterality: N/A;   HYSTERECTOMY, TOTAL, LAPAROSCOPIC, ROBOT-ASSISTED WITH SALPINGECTOMY N/A 04/20/2024   Procedure: HYSTERECTOMY, TOTAL, LAPAROSCOPIC, ROBOT-ASSISTED , MINI LAPAROTMY Morcelation;  Surgeon: Jeralyn Crutch, MD;  Location: [facility] OR;  Service: Gynecology;  Laterality: N/A;   LAPAROSCOPIC BILATERAL SALPINGECTOMY Bilateral 06/27/2021  Procedure: LAPAROSCOPIC BILATERAL SALPINGECTOMY WITH REMOVAL OF ECTOPIC PREGNANCY;  Surgeon: Cleatus Moccasin, MD;  Location: [facility] OR;  Service: Gynecology;  Laterality: Bilateral;   LEFT HEART CATH AND CORONARY ANGIOGRAPHY N/A 01/18/2020   Procedure: LEFT HEART CATH AND CORONARY ANGIOGRAPHY;  Surgeon: Darron Deatrice LABOR, MD;  Location: [facility] INVASIVE CV LAB;  Service: Cardiovascular;  Laterality: N/A;   The following portions of the patient's history were reviewed and updated as appropriate: allergies, current medications, past family  history, past medical history, past social history, past surgical history and problem list.    Review of Systems:  Pertinent items noted in HPI and remainder of comprehensive ROS otherwise negative.  Physical Exam:   General:  Alert, oriented and cooperative. Patient appears to be in no acute distress.  Mental Status: Normal mood and affect. Normal behavior. Normal judgment and thought content.   Respiratory: Normal respiratory effort, no problems with respiration noted  Rest of physical exam deferred due to type of encounter  Labs and Imaging FINAL MICROSCOPIC DIAGNOSIS:   A. UTERUS AND CERVIX, WITH RIGHT FIMBRIA, HYSTERECTOMY:  - Secretory endometrium.  - Leiomyomata.  - Cervix with benign squamous metaplasia.  - Serosal adhesions.  - Fimbria with no specific pathologic change.    Assessment and Plan:     1. Postop check (Primary) Reviewed intraoperative photos and pathology Will have come to clinic early next week for abdominal examination given report of significant umbilical pain  Otherwise, doing well   2. Uterine leiomyoma, unspecified location 3. Abnormal uterine bleeding (AUB) Resolved s/p hysterectomy       I discussed the assessment and treatment plan with the patient. The patient was provided an opportunity to ask questions and all were answered. The patient agreed with the plan and demonstrated an understanding of the instructions.   The patient was advised to call back or seek an in-person evaluation/go to the ED if the symptoms worsen or if the condition fails to improve as anticipated.  I provided 10 minutes of face-to-face time during this encounter. I also spent 10 minutes dedicated to the care of this patient including pre-visit review of records, post visit ordering of medications and appropriate tests or procedures, coordinating care and documenting this visit encounter.    Carter Quarry, MD Center for [employer], [facility] "

## 2024-05-16 ENCOUNTER — Encounter: Payer: Self-pay | Admitting: Obstetrics and Gynecology

## 2024-05-18 ENCOUNTER — Ambulatory Visit: Admitting: Cardiology

## 2024-05-20 ENCOUNTER — Telehealth: Admitting: Obstetrics and Gynecology

## 2024-05-20 DIAGNOSIS — N760 Acute vaginitis: Secondary | ICD-10-CM

## 2024-05-20 DIAGNOSIS — T8149XA Infection following a procedure, other surgical site, initial encounter: Secondary | ICD-10-CM

## 2024-05-20 DIAGNOSIS — Z09 Encounter for follow-up examination after completed treatment for conditions other than malignant neoplasm: Secondary | ICD-10-CM

## 2024-05-20 MED ORDER — FLUCONAZOLE 150 MG PO TABS: 150.0000 mg | ORAL_TABLET | Freq: Once | ORAL | 1 refills | Status: AC

## 2024-05-20 MED ORDER — SULFAMETHOXAZOLE-TRIMETHOPRIM 800-160 MG PO TABS: 1.0000 | ORAL_TABLET | Freq: Two times a day (BID) | ORAL | 0 refills | Status: AC

## 2024-05-20 NOTE — Progress Notes (Signed)
 "   GYNECOLOGY VIRTUAL VISIT ENCOUNTER NOTE  Provider location: [facility]   Patient location: Home  I connected with Jeanette Gamble on 05/20/24 at 11:15 AM EST by MyChart Video Encounter and verified that I am speaking with the correct person using two identifiers.   I discussed the limitations, risks, security and privacy concerns of performing an evaluation and management service virtually and the availability of in person appointments. I also discussed with the patient that there may be a patient responsible charge related to this service. The patient expressed understanding and agreed to proceed.   History:  Jeanette Gamble is a 44 y.o. [phone] female being evaluated today for postop incision concerns. Incisions has been having some discharge and notes it has been present for about 2- 3 weeks. Initially thought it was secondary to her sleeping position. There was some improvement briefly but it the discharge started increasing again. The area is soft. Using soap rag to keep the incision clean. Having hot and cold flashes and not sure if she has a fever. Having pain in that area.   Able to do next week for in person evaluation of incision      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/2023   Dysmenorrhea    H/O alcohol abuse    quit 03/2008 (previously drank 1-2 bottles of liquor daily)  down to occasional   History of abnormal cervical Pap smear 2013   History of pre-eclampsia    Pt underwent C-section 2/2  preeclampsia 02/20/02    History of trichomoniasis    Iron deficiency anemia due to chronic blood loss 2023   ED  severe anemia-- Hg 6.5 x2 PRBCs and IV iron   MDD (major depressive disorder)    Microalbuminuria 06/09/2023   Migraine headache    Resistant hypertension    followed by pcp   Uterine fibroid    Vitamin D  deficiency    Wears glasses    Past Surgical History:  Procedure Laterality Date   CESAREAN SECTION  02/20/2002   @[facility]  by Dr WENDI Na   CYSTOSCOPY N/A 04/20/2024   Procedure: CYSTOSCOPY;  Surgeon: Jeralyn Crutch, MD;  Location: [facility] OR;  Service: Gynecology;  Laterality: N/A;   DILATION AND EVACUATION N/A 06/27/2021   Procedure: Suction DILATATION AND EVACUATION with Ultrasound guidance;  Surgeon: Cleatus Moccasin, MD;  Location: [facility] OR;  Service: Gynecology;  Laterality: N/A;   HYSTERECTOMY, TOTAL, LAPAROSCOPIC, ROBOT-ASSISTED WITH SALPINGECTOMY N/A 04/20/2024   Procedure: HYSTERECTOMY, TOTAL, LAPAROSCOPIC, ROBOT-ASSISTED , MINI LAPAROTMY Morcelation;  Surgeon: Jeralyn Crutch, MD;  Location: [facility] OR;  Service: Gynecology;  Laterality: N/A;   LAPAROSCOPIC BILATERAL SALPINGECTOMY Bilateral 06/27/2021   Procedure: LAPAROSCOPIC BILATERAL SALPINGECTOMY WITH REMOVAL OF ECTOPIC PREGNANCY;  Surgeon: Cleatus Moccasin, MD;  Location: [facility] OR;  Service: Gynecology;  Laterality: Bilateral;   LEFT HEART CATH AND CORONARY ANGIOGRAPHY N/A 01/18/2020   Procedure: LEFT HEART CATH AND CORONARY ANGIOGRAPHY;  Surgeon: Darron Deatrice LABOR, MD;  Location: [facility] INVASIVE CV LAB;  Service: Cardiovascular;  Laterality: N/A;   The following portions of the patient's history were reviewed and updated as appropriate: allergies, current medications, past family history, past medical history, past social history, past surgical history and problem list.    Review of Systems:  Pertinent items noted in HPI and remainder of comprehensive ROS otherwise negative.  Physical Exam:   General:  Alert, oriented and  cooperative. Patient appears to be in no acute distress.  Mental Status: Normal mood and affect. Normal behavior. Normal judgment and thought content.   Respiratory: Normal respiratory effort, no problems with respiration noted  Rest of physical exam deferred due to type of encounter     Assessment and Plan:     1. Postop check (Primary) 2. Acute vaginitis 3. Incisional infection Concern for incision infection given discharge based on visual evaluation. Will treat with bactrim  and given fluconazole  in case of subsequent candida vaginitis. Will have patient come in for in person evaluation within the next 1-2 weeks for in person evaluation to ensure improvement while on antibiotic course.  - fluconazole  (DIFLUCAN ) 150 MG tablet; Take 1 tablet (150 mg total) by mouth once for 1 dose. Can take additional dose three days later if symptoms persist  Dispense: 1 tablet; Refill: 1 - sulfamethoxazole -trimethoprim  (BACTRIM  DS) 800-160 MG tablet; Take 1 tablet by mouth 2 (two) times daily.  Dispense: 14 tablet; Refill: 0      I discussed the assessment and treatment plan with the patient. The patient was provided an opportunity to ask questions and all were answered. The patient agreed with the plan and demonstrated an understanding of the instructions.   The patient was advised to call back or seek an in-person evaluation/go to the ED if the symptoms worsen or if the condition fails to improve as anticipated.  I provided 10 minutes of face-to-face time during this encounter. I also spent 5 minutes dedicated to the care of this patient including pre-visit review of records, post visit ordering of medications and appropriate tests or procedures, coordinating care and documenting this visit encounter.    Carter Quarry, MD [facility] [employer] "

## 2024-06-17 ENCOUNTER — Ambulatory Visit: Payer: Self-pay | Admitting: Obstetrics and Gynecology

## 2024-06-28 ENCOUNTER — Ambulatory Visit: Admitting: Cardiology
